# Patient Record
Sex: Male | Born: 1960 | Race: Black or African American | Hispanic: No | Smoking: Current every day smoker
Health system: Southern US, Community
[De-identification: ages and names within clinical notes are randomized; demographics above are authoritative.]

## PROBLEM LIST (undated history)

## (undated) ENCOUNTER — Emergency Department (HOSPITAL_COMMUNITY): Admission: EM | Payer: Self-pay | Source: Home / Self Care

## (undated) DIAGNOSIS — G4730 Sleep apnea, unspecified: Secondary | ICD-10-CM

## (undated) DIAGNOSIS — J189 Pneumonia, unspecified organism: Secondary | ICD-10-CM

## (undated) DIAGNOSIS — M1990 Unspecified osteoarthritis, unspecified site: Secondary | ICD-10-CM

## (undated) DIAGNOSIS — I10 Essential (primary) hypertension: Secondary | ICD-10-CM

## (undated) DIAGNOSIS — F329 Major depressive disorder, single episode, unspecified: Secondary | ICD-10-CM

## (undated) DIAGNOSIS — K219 Gastro-esophageal reflux disease without esophagitis: Secondary | ICD-10-CM

## (undated) DIAGNOSIS — F419 Anxiety disorder, unspecified: Secondary | ICD-10-CM

## (undated) DIAGNOSIS — F4310 Post-traumatic stress disorder, unspecified: Secondary | ICD-10-CM

## (undated) DIAGNOSIS — N400 Enlarged prostate without lower urinary tract symptoms: Secondary | ICD-10-CM

## (undated) HISTORY — PX: KNEE SURGERY: SHX244

---

## 1998-05-19 ENCOUNTER — Emergency Department (HOSPITAL_COMMUNITY): Admission: EM | Admit: 1998-05-19 | Discharge: 1998-05-19 | Payer: Self-pay | Admitting: Emergency Medicine

## 1998-05-19 ENCOUNTER — Encounter: Payer: Self-pay | Admitting: Emergency Medicine

## 1999-01-16 ENCOUNTER — Emergency Department (HOSPITAL_COMMUNITY): Admission: EM | Admit: 1999-01-16 | Discharge: 1999-01-16 | Payer: Self-pay | Admitting: Emergency Medicine

## 1999-08-15 ENCOUNTER — Emergency Department (HOSPITAL_COMMUNITY): Admission: EM | Admit: 1999-08-15 | Discharge: 1999-08-15 | Payer: Self-pay | Admitting: Emergency Medicine

## 1999-08-15 ENCOUNTER — Encounter: Payer: Self-pay | Admitting: Emergency Medicine

## 2002-10-18 ENCOUNTER — Encounter: Admission: RE | Admit: 2002-10-18 | Discharge: 2002-10-18 | Payer: Self-pay | Admitting: Family Medicine

## 2002-10-18 ENCOUNTER — Encounter: Payer: Self-pay | Admitting: Family Medicine

## 2003-06-09 ENCOUNTER — Emergency Department (HOSPITAL_COMMUNITY): Admission: AD | Admit: 2003-06-09 | Discharge: 2003-06-10 | Payer: Self-pay | Admitting: Emergency Medicine

## 2011-11-25 ENCOUNTER — Emergency Department (HOSPITAL_COMMUNITY)
Admission: EM | Admit: 2011-11-25 | Discharge: 2011-11-25 | Disposition: A | Discharge status: Home / Self Care | Payer: Self-pay | Attending: Emergency Medicine | Admitting: Emergency Medicine

## 2011-11-25 ENCOUNTER — Emergency Department (HOSPITAL_COMMUNITY): Payer: Self-pay

## 2011-11-25 ENCOUNTER — Encounter (HOSPITAL_COMMUNITY): Payer: Self-pay | Admitting: *Deleted

## 2011-11-25 ENCOUNTER — Emergency Department (INDEPENDENT_AMBULATORY_CARE_PROVIDER_SITE_OTHER)
Admission: EM | Admit: 2011-11-25 | Discharge: 2011-11-25 | Disposition: A | Discharge status: Nursing Home (Includes ICF) | Payer: Self-pay | Source: Home / Self Care | Attending: Emergency Medicine | Admitting: Emergency Medicine

## 2011-11-25 DIAGNOSIS — I10 Essential (primary) hypertension: Secondary | ICD-10-CM

## 2011-11-25 DIAGNOSIS — R51 Headache: Secondary | ICD-10-CM | POA: Insufficient documentation

## 2011-11-25 DIAGNOSIS — F17200 Nicotine dependence, unspecified, uncomplicated: Secondary | ICD-10-CM | POA: Insufficient documentation

## 2011-11-25 DIAGNOSIS — I169 Hypertensive crisis, unspecified: Secondary | ICD-10-CM

## 2011-11-25 DIAGNOSIS — R079 Chest pain, unspecified: Secondary | ICD-10-CM | POA: Insufficient documentation

## 2011-11-25 DIAGNOSIS — R42 Dizziness and giddiness: Secondary | ICD-10-CM | POA: Insufficient documentation

## 2011-11-25 DIAGNOSIS — H65499 Other chronic nonsuppurative otitis media, unspecified ear: Secondary | ICD-10-CM | POA: Insufficient documentation

## 2011-11-25 HISTORY — DX: Essential (primary) hypertension: I10

## 2011-11-25 LAB — CBC
Hemoglobin: 13.2 g/dL (ref 13.0–17.0)
MCH: 32.8 pg (ref 26.0–34.0)
Platelets: 127 10*3/uL — ABNORMAL LOW (ref 150–400)
RBC: 4.02 MIL/uL — ABNORMAL LOW (ref 4.22–5.81)
WBC: 6.4 10*3/uL (ref 4.0–10.5)

## 2011-11-25 LAB — BASIC METABOLIC PANEL
BUN: 9 mg/dL (ref 6–23)
Chloride: 102 mEq/L (ref 96–112)
GFR calc non Af Amer: >90 mL/min (ref >90)
Glucose, Bld: 104 mg/dL — ABNORMAL HIGH (ref 70–99)
Potassium: 3.1 mEq/L — ABNORMAL LOW (ref 3.5–5.1)
Sodium: 140 mEq/L (ref 135–145)

## 2011-11-25 LAB — DIFFERENTIAL
Eosinophils Absolute: 0.1 10*3/uL (ref 0.0–0.7)
Lymphocytes Relative: 40 % (ref 12–46)
Lymphs Abs: 2.5 10*3/uL (ref 0.7–4.0)
Monocytes Relative: 7 % (ref 3–12)
Neutro Abs: 3.3 10*3/uL (ref 1.7–7.7)
Neutrophils Relative %: 51 % (ref 43–77)

## 2011-11-25 MED ORDER — TRIAMTERENE-HCTZ 37.5-25 MG PO TABS: 1.0000 | ORAL_TABLET | Freq: Every day | ORAL | Status: DC

## 2011-11-25 MED ORDER — IOHEXOL 350 MG/ML SOLN
50.0000 mL | Freq: Once | INTRAVENOUS | Status: AC | PRN
  Administered 2011-11-25: 50 mL via INTRAVENOUS

## 2011-11-25 MED ORDER — DIPHENHYDRAMINE HCL 50 MG/ML IJ SOLN
25.0000 mg | Freq: Once | INTRAMUSCULAR | Status: AC
  Administered 2011-11-25: 25 mg via INTRAVENOUS
  Filled 2011-11-25: qty 1

## 2011-11-25 MED ORDER — METOCLOPRAMIDE HCL 5 MG/ML IJ SOLN
10.0000 mg | Freq: Once | INTRAMUSCULAR | Status: AC
  Administered 2011-11-25: 10 mg via INTRAVENOUS
  Filled 2011-11-25: qty 2

## 2011-11-25 MED ORDER — SODIUM CHLORIDE 0.9 % IV BOLUS (SEPSIS)
1000.0000 mL | Freq: Once | INTRAVENOUS | Status: AC
  Administered 2011-11-25 (×2): 1000 mL via INTRAVENOUS

## 2011-11-25 MED ORDER — POTASSIUM CHLORIDE CRYS ER 20 MEQ PO TBCR
EXTENDED_RELEASE_TABLET | ORAL | Status: AC
  Administered 2011-11-25: 40 meq
  Filled 2011-11-25: qty 2

## 2011-11-25 MED ORDER — LABETALOL HCL 5 MG/ML IV SOLN: 10.0000 mg | Freq: Once | INTRAVENOUS | Status: DC

## 2011-11-25 MED ORDER — POTASSIUM CHLORIDE 20 MEQ/15ML (10%) PO LIQD: 40.0000 meq | Freq: Once | ORAL | Status: DC

## 2011-11-25 NOTE — ED Provider Notes (Addendum)
History     CSN: [id number]  Arrival date & time 11/25/11  1548   First MD Initiated Contact with Patient 11/25/11 1622      Chief Complaint  Patient presents with  . Headache    (Consider location/radiation/quality/duration/timing/severity/associated sxs/prior treatment) HPI    PW multple complaints. Gradual onset yesterday morning.  "All over at the top and pushing in from this sides"Took two advils yesterday with no relief. Headache currently 4-5/10. Denies visual changes. +Photophobia, +phonophobia. Denies nausea, vomiting. Denies numbness/tingling/weakness. +Lightheadedness, none currently after lying down. Denies neck stiffness. No head trauma. Mother has h/o aneurysm.  Patient also c/o chest pain, last night in center of chest. Burning in nature. Feels like his typical acid reflux. Took prilosec with relief.    C/O b/l lower back pain x one month, achy in nature.  Denies history of recent trauma/falls. Denies h/o malignancy, DM, immunocompromise  injection drug use, immunosuppression, indwelling urinary catheter, prolonged steroid use, skin or urinary tract infection. No numbness/tingling/weakness of extremities. Denies fever/chills. Denies saddle anesthesia, no urinary incontinence or retention.  States that the back pain is better today.  H/o etoh abuse    Past Medical History  Diagnosis Date  . Hypertension     Past Surgical History  Procedure Date  . Knee surgery     Family History  Problem Relation Age of Onset  . Diabetes Maternal Aunt   . Diabetes Paternal Aunt     History  Substance Use Topics  . Smoking status: Current Everyday Smoker -- 25 years    Types: Cigarettes  . Smokeless tobacco: Not on file  . Alcohol Use: 1.8 oz/week    3 Shots of liquor per week      Review of Systems  All other systems reviewed and are negative.   except as noted HPI   Allergies  Review of patient's allergies indicates no known allergies.  Home Medications     Current Outpatient Rx  Name Route Sig Dispense Refill  . PRILOSEC PO Oral Take 1 tablet by mouth daily as needed. For acid reflux    . TRIAMTERENE-HCTZ 37.5-25 MG PO TABS Oral Take 1 each (1 tablet total) by mouth daily. 30 tablet 0    BP 180/94  Pulse 61  Temp(Src) 98.1 F (36.7 C) (Oral)  Resp 20  SpO2 100%  Physical Exam  Nursing note and vitals reviewed. Constitutional: He is oriented to person, place, and time. He appears well-developed and well-nourished. No distress.  HENT:  Head: Atraumatic.  Mouth/Throat: Oropharynx is clear and moist.  Eyes: Conjunctivae are normal. Pupils are equal, round, and reactive to light.  Neck: Neck supple.  Cardiovascular: Normal rate, regular rhythm, normal heart sounds and intact distal pulses.  Exam reveals no gallop and no friction rub.   No murmur heard. Pulmonary/Chest: Effort normal. No respiratory distress. He has no wheezes. He has no rales.  Abdominal: Soft. Bowel sounds are normal. There is no tenderness. There is no rebound and no guarding.  Musculoskeletal: Normal range of motion. He exhibits no edema and no tenderness.       No midline c/t/l/s ttp   Neurological: He is alert and oriented to person, place, and time. No cranial nerve deficit. He exhibits normal muscle tone. Coordination normal.       Strength 5/5 all extremities No pronator drift No facial droop  No saddle anesthesia  Skin: Skin is warm and dry.  Psychiatric: He has a normal mood and affect.  Date: 11/25/2011  Rate: 61  Rhythm: normal sinus rhythm  QRS Axis: normal  Intervals: normal  ST/T Wave abnormalities: nonspecific ST changes  Conduction Disutrbances:none  Narrative Interpretation: LVH  Old EKG Reviewed: unchanged   ED Course  Procedures (including critical care time)  Labs Reviewed  CBC - Abnormal; Notable for the following:    RBC 4.02 (*)    HCT 38.7 (*)    Platelets 127 (*)    All other components within normal limits  BASIC  METABOLIC PANEL - Abnormal; Notable for the following:    Potassium 3.1 (*)    Glucose, Bld 104 (*)    All other components within normal limits  DIFFERENTIAL  TROPONIN I   Ct Angio Head W/cm &/or Wo Cm  11/25/2011  *RADIOLOGY REPORT*  Clinical Data:  Headache.  Dizziness.  The patient's mother had an aneurysm.  CT ANGIOGRAPHY HEAD  Technique:  Multidetector CT imaging of the head was performed using the standard protocol during bolus administration of intravenous contrast.  Multiplanar CT image reconstructions including MIPs were obtained to evaluate the vascular anatomy.  Contrast: 50mL OMNIPAQUE IOHEXOL 350 MG/ML SOLN  Comparison:   None.  Findings:  No acute infarct, hemorrhage, mass lesion is present. The ventricles are of normal size.  No significant extra-axial fluid collection is present.  The postcontrast images demonstrate no areas of pathologic enhancement.  The CT angio source images demonstrate normal gray white differentiation.  The internal carotid arteries are within normal limits to the ICA termini.  The A1 and M1 segments are within normal limits.  The ACA and MCA branch vessels are within normal limits.  Anterior communicating artery is patent.  The left vertebral artery is the dominant vessel.  The left PICA origin is visualized and normal.  The right vertebral artery is hypoplastic and terminates at the PICA.  The basilar artery is within normal limits.  Both posterior cerebral arteries originate from the basilar tip.  The PCA branch vessels are within normal limits.   Review of the MIP images confirms the above findings.  IMPRESSION: Negative CTA of the head.  Original Report Authenticated By: Jamesetta Orleans. MATTERN, M.D.    1. Hypertension   2. Headache     MDM  PW headache. Migrainous in nature. Neuro intact. Mother with h/o aneurysm. CTA negative. I doubt SAH clinically. Feeling better with IVF, reglan, benadryl. Headache 2/10 at this time. Blood pressure remains elevated as  below (from documented 208/131 [facility]). Patient declining IV antihypertensives. There may be some component of etoh w/d here. He is not tremulous in the ED. He wants to go home. His chest pain was burning in nature and his typical chronic GERD sx, relief with his antacid. Has been started on Maxzide for hypertension, given referral for PMD f/u. No EMC precluding discharge at this time. Given Precautions for return.   11/25/11 1700 -- 89 19 174/98 100 -- BGJ  BP 180/94  Pulse 61  Temp(Src) 98.1 F (36.7 C) (Oral)  Resp 20  SpO2 100%    Forbes Cellar, MD 11/25/11 2029  Forbes Cellar, MD 11/25/11 2031

## 2011-11-25 NOTE — ED Notes (Signed)
[facility] called for transport 

## 2011-11-25 NOTE — Discharge Instructions (Signed)
General Headache, Without Cause A general headache has no specific cause. These headaches are not life-threatening. They will not lead to other types of headaches. HOME CARE   Make and keep follow-up visits with your doctor.   Only take medicine as told by your doctor.   Try to relax, get a massage, or use your thoughts to control your body (biofeedback).   Apply cold or heat to the head and neck. Apply 3 or 4 times a day or as needed.  Finding out the results of your test Ask when your test results will be ready. Make sure you get your test results. GET HELP RIGHT AWAY IF:   You have problems with medicine.   Your medicine does not help relieve pain.   Your headache changes or becomes worse.   You feel sick to your stomach (nauseous) or throw up (vomit).   You have a temperature by mouth above 102 F (38.9 C), not controlled by medicine.   Your have a stiff neck.   You have vision loss.   You have muscle weakness.   You lose control of your muscles.   You lose balance or have trouble walking.   You feel like you are going to pass out (faint).  MAKE SURE YOU:   Understand these instructions.   Will watch this condition.   Will get help right away if you are not doing well or get worse.  Document Released: 04/08/2008 Document Revised: 06/19/2011 Document Reviewed: 04/08/2008 [facility] Patient Information 2012 [place], [place].  Hypertension As your heart beats, it forces blood through your arteries. This force is your blood pressure. If the pressure is too high, it is called hypertension (HTN) or high blood pressure. HTN is dangerous because you may have it and not know it. High blood pressure may mean that your heart has to work harder to pump blood. Your arteries may be narrow or stiff. The extra work puts you at risk for heart disease, stroke, and other problems.  Blood pressure consists of two numbers, a higher number over a lower, 110/72, for example. It is stated  as "110 over 72." The ideal is below 120 for the top number (systolic) and under 80 for the bottom (diastolic). Write down your blood pressure today. You should pay close attention to your blood pressure if you have certain conditions such as:  Heart failure.   Prior heart attack.   Diabetes   Chronic kidney disease.   Prior stroke.   Multiple risk factors for heart disease.  To see if you have HTN, your blood pressure should be measured while you are seated with your arm held at the level of the heart. It should be measured at least twice. A one-time elevated blood pressure reading (especially in the Emergency Department) does not mean that you need treatment. There may be conditions in which the blood pressure is different between your right and left arms. It is important to see your caregiver soon for a recheck. Most people have essential hypertension which means that there is not a specific cause. This type of high blood pressure may be lowered by changing lifestyle factors such as:  Stress.   Smoking.   Lack of exercise.   Excessive weight.   Drug/tobacco/alcohol use.   Eating less salt.  Most people do not have symptoms from high blood pressure until it has caused damage to the body. Effective treatment can often prevent, delay or reduce that damage. TREATMENT  When a cause  has been identified, treatment for high blood pressure is directed at the cause. There are a large number of medications to treat HTN. These fall into several categories, and your caregiver will help you select the medicines that are best for you. Medications may have side effects. You should review side effects with your caregiver. If your blood pressure stays high after you have made lifestyle changes or started on medicines,   Your medication(s) may need to be changed.   Other problems may need to be addressed.   Be certain you understand your prescriptions, and know how and when to take your  medicine.   Be sure to follow up with your caregiver within the time frame advised (usually within two weeks) to have your blood pressure rechecked and to review your medications.   If you are taking more than one medicine to lower your blood pressure, make sure you know how and at what times they should be taken. Taking two medicines at the same time can result in blood pressure that is too low.  SEEK IMMEDIATE MEDICAL CARE IF:  You develop a severe headache, blurred or changing vision, or confusion.   You have unusual weakness or numbness, or a faint feeling.   You have severe chest or abdominal pain, vomiting, or breathing problems.  MAKE SURE YOU:   Understand these instructions.   Will watch your condition.   Will get help right away if you are not doing well or get worse.  Document Released: 06/30/2005 Document Revised: 06/19/2011 Document Reviewed: 02/18/2008 [facility] Patient Information 2012 [place], [place].  RESOURCE GUIDE  Dental Problems  Patients with Medicaid: [facility] [phone]                                           [phone] [address] [employer] Phone:  [phone]                                                   Phone:  [phone]  If unable to pay or uninsured, contact:  [facility] or [facility]. to become qualified for the adult dental clinic.  Chronic Pain Problems Contact [facility]  [phone] Patients need to be referred by their primary care doctor.  Insufficient Money for Medicine Contact [employer] Way:  call "211" or [facility] [phone].  No Primary Care Doctor Call [facility]  [phone] Other agencies that provide inexpensive medical care    [facility]  [phone]    [facility]  [phone]    [facility]  [phone]    [facility]  [phone]    [facility]  [phone]    [facility]   [phone]  Psychological Services [facility]  [phone] [facility]  [phone] [facility]   [phone] (emergency services [phone])  Abuse/Neglect [facility] Child Abuse Hotline [phone] [facility] Child Abuse Hotline [phone] (After Hours)  Emergency Shelter [facility] 9078540414  Maternity Homes [facility] [place] [facility] [phone]  [employer] Services [phone]  MRSA Hotline #:   902-849-2611    [facility] Resources  [facility]  [employer]                           [facility] Dept. [address] [place]                     [address]         [address]  Blondell Reveal Phone:  [phone]                                  Phone:  [phone]                   Phone:  [phone]  [facility] Phone:  [phone]  [facility] Child Abuse Hotline [phone] (After Hours)

## 2011-11-25 NOTE — ED Notes (Signed)
Patient presents from Urgent Care with c/o headache for 2 days and intermittent CP (he is pain free now)

## 2011-11-25 NOTE — ED Provider Notes (Addendum)
History     CSN: [id number]  Arrival date & time 11/25/11  1401   First MD Initiated Contact with Patient 11/25/11 1448      Chief Complaint  Patient presents with  . Headache    (Consider location/radiation/quality/duration/timing/severity/associated sxs/prior treatment) HPI Comments: Patient is brought in by family members he is been complaining of dizziness and severe headache since yesterday feeling at times that he has felt like he was going pass out. Patient denies any upper or lower extremity weakness but has been having bilateral upper back pain for about a month. Patient describes that he was last alcohol was last night around 7:30 PM.  Patient describes yesterday the light was bothering him as he was having the severe headache this morning as he was preparing to go to work this headache was worse and "pounding". Denies any other symptoms as visual disturbance or incontinence of urine or bowels. Patient describes have been having this intermittent chest discomfort (points to the substernal area) " ITS NOT ALL THE TIME"  Patient is a 51 y.o. male presenting with headaches. The history is provided by the patient.  Headache The primary symptoms include headaches, dizziness and nausea. Primary symptoms do not include syncope, loss of consciousness, altered mental status, seizures, visual change, paresthesias, focal weakness, loss of sensation, speech change, memory loss, fever or vomiting. The symptoms began yesterday.  The headache is associated with photophobia. The headache is not associated with visual change, neck stiffness, paresthesias, weakness or loss of balance.  Dizziness also occurs with nausea. Dizziness does not occur with tinnitus, vomiting, weakness or diaphoresis.  Additional symptoms include pain and photophobia. Additional symptoms do not include neck stiffness, weakness, loss of balance, nystagmus, hearing loss, tinnitus or vertigo. Medical issues also include alcohol  use and hypertension. Medical issues do not include seizures.    Past Medical History  Diagnosis Date  . Hypertension     Past Surgical History  Procedure Date  . Knee surgery     Family History  Problem Relation Age of Onset  . Diabetes Maternal Aunt   . Diabetes Paternal Aunt     History  Substance Use Topics  . Smoking status: Current Everyday Smoker -- 25 years    Types: Cigarettes  . Smokeless tobacco: Not on file  . Alcohol Use: 1.8 oz/week    3 Shots of liquor per week      Review of Systems  Constitutional: Negative for fever, chills, diaphoresis, activity change and appetite change.  HENT: Negative for hearing loss, neck stiffness and tinnitus.   Eyes: Positive for photophobia. Negative for visual disturbance.  Cardiovascular: Positive for chest pain. Negative for palpitations and syncope.  Gastrointestinal: Positive for nausea. Negative for vomiting, abdominal pain and rectal pain.  Genitourinary: Negative for dysuria.  Neurological: Positive for dizziness, tremors, light-headedness and headaches. Negative for vertigo, speech change, focal weakness, seizures, loss of consciousness, speech difficulty, weakness, numbness, paresthesias and loss of balance.  Psychiatric/Behavioral: Negative for memory loss and altered mental status.    Allergies  Review of patient's allergies indicates no known allergies.  Home Medications  No current outpatient prescriptions on file.  BP 193/108  Pulse 64  Temp(Src) 98.4 F (36.9 C) (Oral)  Resp 20  SpO2 98%  Physical Exam  Nursing note and vitals reviewed. Constitutional: He is oriented to person, place, and time.  Non-toxic appearance. He has a sickly appearance. He does not appear ill. He appears distressed.  HENT:  Head: Normocephalic.  Eyes:  Conjunctivae are normal. Pupils are equal, round, and reactive to light.  Neck: Neck supple. No JVD present.  Cardiovascular: Normal rate, regular rhythm, normal heart  sounds and normal pulses.   Pulmonary/Chest: Effort normal and breath sounds normal. He has no decreased breath sounds. He has no wheezes. He has no rhonchi. He has no rales.  Musculoskeletal: Normal range of motion.  Lymphadenopathy:    He has no cervical adenopathy.  Neurological: He is alert and oriented to person, place, and time. He is not disoriented. He displays tremor. No cranial nerve deficit or sensory deficit. He exhibits normal muscle tone. Coordination normal.    ED Course  Procedures (including critical care time)  Labs Reviewed - No data to display No results found.   1. Hypertensive crisis       MDM    hypertensive crisis with intermittent substernal discomforts. And possibly early signs of ETOH withdrawal. He will need significant monitoring-observation blood pressure control. At this point patient does not have any electrocardiographic symptoms of acute coronary event. Given his risk factors and intermittent nature of the substernal chest pain will consider further workup including cardiac. EKG showed a sinus bradycardia and left ventricular hypertrophy no ST depressions or elevations were noted. No Q waves. Segments and intervals within normal values.     Jimmie Molly, MD 11/25/11 1506  Jimmie Molly, MD 11/25/11 1511  Jimmie Molly, MD 11/25/11 1536

## 2011-11-25 NOTE — ED Notes (Signed)
Cardiac monitor applied---NSR without ectopics seen  And o2 2L nasal cannula applied

## 2011-11-25 NOTE — ED Notes (Signed)
Patient presents with c/o headache for the past 2 days, some dizziness and epigastric pain.

## 2011-11-25 NOTE — ED Notes (Signed)
[facility] here for transport to main ED

## 2011-11-25 NOTE — ED Notes (Signed)
Pt reports dizziness and a headache yesterday.   He admits to several alcoholic  Drinks last night.  Today c/o headache and some dizziness.  He also c/o bilateral flank pain X 1 month.  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

## 2011-11-25 NOTE — Discharge Instructions (Signed)
Arterial Hypertension Arterial hypertension (high blood pressure) is a condition of elevated pressure in your blood vessels. Hypertension over a long period of time is a risk factor for strokes, heart attacks, and heart failure. It is also the leading cause of kidney (renal) failure.  CAUSES   In Adults -- Over 90% of all hypertension has no known cause. This is called essential or primary hypertension. In the other 10% of people with hypertension, the increase in blood pressure is caused by another disorder. This is called secondary hypertension. Important causes of secondary hypertension are:   Heavy alcohol use.   Obstructive sleep apnea.   Hyperaldosterosim (Conn's syndrome).   Steroid use.   Chronic kidney failure.   Hyperparathyroidism.   Medications.   Renal artery stenosis.   Pheochromocytoma.   Cushing's disease.   Coarctation of the aorta.   Scleroderma renal crisis.   Licorice (in excessive amounts).   Drugs (cocaine, methamphetamine).  Your caregiver can explain any items above that apply to you.  In Children -- Secondary hypertension is more common and should always be considered.   Pregnancy -- Few women of childbearing age have high blood pressure. However, up to 10% of them develop hypertension of pregnancy. Generally, this will not harm the woman. It may be a sign of 3 complications of pregnancy: preeclampsia, HELLP syndrome, and eclampsia. Follow up and control with medication is necessary.  SYMPTOMS   This condition normally does not produce any noticeable symptoms. It is usually found during a routine exam.   Malignant hypertension is a late problem of high blood pressure. It may have the following symptoms:   Headaches.   Blurred vision.   End-organ damage (this means your kidneys, heart, lungs, and other organs are being damaged).   Stressful situations can increase the blood pressure. If a person with normal blood pressure has their blood  pressure go up while being seen by their caregiver, this is often termed "white coat hypertension." Its importance is not known. It may be related with eventually developing hypertension or complications of hypertension.   Hypertension is often confused with mental tension, stress, and anxiety.  DIAGNOSIS  The diagnosis is made by 3 separate blood pressure measurements. They are taken at least 1 week apart from each other. If there is organ damage from hypertension, the diagnosis may be made without repeat measurements. Hypertension is usually identified by having blood pressure readings:  Above 140/90 mmHg measured in both arms, at 3 separate times, over a couple weeks.   Over 130/80 mmHg should be considered a risk factor and may require treatment in patients with diabetes.  Blood pressure readings over 120/80 mmHg are called "pre-hypertension" even in non-diabetic patients. To get a true blood pressure measurement, use the following guidelines. Be aware of the factors that can alter blood pressure readings.  Take measurements at least 1 hour after caffeine.   Take measurements 30 minutes after smoking and without any stress. This is another reason to quit smoking - it raises your blood pressure.   Use a proper cuff size. Ask your caregiver if you are not sure about your cuff size.   Most home blood pressure cuffs are automatic. They will measure systolic and diastolic pressures. The systolic pressure is the pressure reading at the start of sounds. Diastolic pressure is the pressure at which the sounds disappear. If you are elderly, measure pressures in multiple postures. Try sitting, lying or standing.   Sit at rest for a minimum of   5 minutes before taking measurements.   You should not be on any medications like decongestants. These are found in many cold medications.   Record your blood pressure readings and review them with your caregiver.  If you have hypertension:  Your caregiver  may do tests to be sure you do not have secondary hypertension (see "causes" above).   Your caregiver may also look for signs of metabolic syndrome. This is also called Syndrome X or Insulin Resistance Syndrome. You may have this syndrome if you have type 2 diabetes, abdominal obesity, and abnormal blood lipids in addition to hypertension.   Your caregiver will take your medical and family history and perform a physical exam.   Diagnostic tests may include blood tests (for glucose, cholesterol, potassium, and kidney function), a urinalysis, or an EKG. Other tests may also be necessary depending on your condition.  PREVENTION  There are important lifestyle issues that you can adopt to reduce your chance of developing hypertension:  Maintain a normal weight.   Limit the amount of salt (sodium) in your diet.   Exercise often.   Limit alcohol intake.   Get enough potassium in your diet. Discuss specific advice with your caregiver.   Follow a DASH diet (dietary approaches to stop hypertension). This diet is rich in fruits, vegetables, and low-fat dairy products, and avoids certain fats.  PROGNOSIS  Essential hypertension cannot be cured. Lifestyle changes and medical treatment can lower blood pressure and reduce complications. The prognosis of secondary hypertension depends on the underlying cause. Many people whose hypertension is controlled with medicine or lifestyle changes can live a normal, healthy life.  RISKS AND COMPLICATIONS  While high blood pressure alone is not an illness, it often requires treatment due to its short- and long-term effects on many organs. Hypertension increases your risk for:  CVAs or strokes (cerebrovascular accident).   Heart failure due to chronically high blood pressure (hypertensive cardiomyopathy).   Heart attack (myocardial infarction).   Damage to the retina (hypertensive retinopathy).   Kidney failure (hypertensive nephropathy).  Your caregiver can  explain list items above that apply to you. Treatment of hypertension can significantly reduce the risk of complications. TREATMENT   For overweight patients, weight loss and regular exercise are recommended. Physical fitness lowers blood pressure.   Mild hypertension is usually treated with diet and exercise. A diet rich in fruits and vegetables, fat-free dairy products, and foods low in fat and salt (sodium) can help lower blood pressure. Decreasing salt intake decreases blood pressure in a 1/3 of people.   Stop smoking if you are a smoker.  The steps above are highly effective in reducing blood pressure. While these actions are easy to suggest, they are difficult to achieve. Most patients with moderate or severe hypertension end up requiring medications to bring their blood pressure down to a normal level. There are several classes of medications for treatment. Blood pressure pills (antihypertensives) will lower blood pressure by their different actions. Lowering the blood pressure by 10 mmHg may decrease the risk of complications by as much as 25%. The goal of treatment is effective blood pressure control. This will reduce your risk for complications. Your caregiver will help you determine the best treatment for you according to your lifestyle. What is excellent treatment for one person, may not be for you. HOME CARE INSTRUCTIONS   Do not smoke.   Follow the lifestyle changes outlined in the "Prevention" section.   If you are on medications, follow the directions   carefully. Blood pressure medications must be taken as prescribed. Skipping doses reduces their benefit. It also puts you at risk for problems.   Follow up with your caregiver, as directed.   If you are asked to monitor your blood pressure at home, follow the guidelines in the "Diagnosis" section above.  SEEK MEDICAL CARE IF:   You think you are having medication side effects.   You have recurrent headaches or lightheadedness.     You have swelling in your ankles.   You have trouble with your vision.  SEEK IMMEDIATE MEDICAL CARE IF:   You have sudden onset of chest pain or pressure, difficulty breathing, or other symptoms of a heart attack.   You have a severe headache.   You have symptoms of a stroke (such as sudden weakness, difficulty speaking, difficulty walking).  MAKE SURE YOU:   Understand these instructions.   Will watch your condition.   Will get help right away if you are not doing well or get worse.  Document Released: 06/30/2005 Document Revised: 06/19/2011 Document Reviewed: 01/28/2007 [employer] Patient Information 2012 [employer]. 

## 2012-02-09 ENCOUNTER — Emergency Department (HOSPITAL_COMMUNITY): Payer: Self-pay

## 2012-02-09 ENCOUNTER — Encounter (HOSPITAL_COMMUNITY): Payer: Self-pay | Admitting: *Deleted

## 2012-02-09 ENCOUNTER — Inpatient Hospital Stay (HOSPITAL_COMMUNITY)
Admission: EM | Admit: 2012-02-09 | Discharge: 2012-02-12 | DRG: 177 | Disposition: A | Discharge status: Home / Self Care | Payer: MEDICAID | Attending: Internal Medicine | Admitting: Internal Medicine

## 2012-02-09 ENCOUNTER — Inpatient Hospital Stay (HOSPITAL_COMMUNITY): Payer: Self-pay

## 2012-02-09 DIAGNOSIS — I10 Essential (primary) hypertension: Secondary | ICD-10-CM | POA: Diagnosis present

## 2012-02-09 DIAGNOSIS — N170 Acute kidney failure with tubular necrosis: Secondary | ICD-10-CM | POA: Diagnosis present

## 2012-02-09 DIAGNOSIS — F17200 Nicotine dependence, unspecified, uncomplicated: Secondary | ICD-10-CM | POA: Diagnosis present

## 2012-02-09 DIAGNOSIS — Z79899 Other long term (current) drug therapy: Secondary | ICD-10-CM

## 2012-02-09 DIAGNOSIS — J189 Pneumonia, unspecified organism: Secondary | ICD-10-CM | POA: Diagnosis present

## 2012-02-09 DIAGNOSIS — E876 Hypokalemia: Secondary | ICD-10-CM | POA: Diagnosis present

## 2012-02-09 DIAGNOSIS — M6282 Rhabdomyolysis: Secondary | ICD-10-CM | POA: Diagnosis present

## 2012-02-09 DIAGNOSIS — A481 Legionnaires' disease: Secondary | ICD-10-CM | POA: Diagnosis present

## 2012-02-09 DIAGNOSIS — N179 Acute kidney failure, unspecified: Secondary | ICD-10-CM | POA: Diagnosis present

## 2012-02-09 DIAGNOSIS — K219 Gastro-esophageal reflux disease without esophagitis: Secondary | ICD-10-CM | POA: Diagnosis present

## 2012-02-09 DIAGNOSIS — E46 Unspecified protein-calorie malnutrition: Secondary | ICD-10-CM | POA: Diagnosis present

## 2012-02-09 DIAGNOSIS — E871 Hypo-osmolality and hyponatremia: Secondary | ICD-10-CM | POA: Diagnosis present

## 2012-02-09 DIAGNOSIS — F1010 Alcohol abuse, uncomplicated: Secondary | ICD-10-CM | POA: Diagnosis present

## 2012-02-09 DIAGNOSIS — R17 Unspecified jaundice: Secondary | ICD-10-CM | POA: Diagnosis present

## 2012-02-09 DIAGNOSIS — Z720 Tobacco use: Secondary | ICD-10-CM | POA: Diagnosis present

## 2012-02-09 HISTORY — DX: Gastro-esophageal reflux disease without esophagitis: K21.9

## 2012-02-09 HISTORY — DX: Pneumonia, unspecified organism: J18.9

## 2012-02-09 LAB — HEPATIC FUNCTION PANEL
ALT: 28 U/L (ref 0–53)
Alkaline Phosphatase: 48 U/L (ref 39–117)
Bilirubin, Direct: 0.7 mg/dL — ABNORMAL HIGH (ref 0.0–0.3)
Indirect Bilirubin: 1.2 mg/dL — ABNORMAL HIGH (ref 0.3–0.9)
Total Bilirubin: 1.9 mg/dL — ABNORMAL HIGH (ref 0.3–1.2)
Total Protein: 6.2 g/dL (ref 6.0–8.3)

## 2012-02-09 LAB — TSH: TSH: 1.137 u[IU]/mL (ref 0.350–4.500)

## 2012-02-09 LAB — URINALYSIS, ROUTINE W REFLEX MICROSCOPIC
Bilirubin Urine: NEGATIVE
Glucose, UA: NEGATIVE mg/dL
Ketones, ur: NEGATIVE mg/dL
Leukocytes, UA: NEGATIVE
Nitrite: NEGATIVE
Specific Gravity, Urine: 1.015 (ref 1.005–1.030)
pH: 5.5 (ref 5.0–8.0)

## 2012-02-09 LAB — BASIC METABOLIC PANEL
BUN: 29 mg/dL — ABNORMAL HIGH (ref 6–23)
CO2: 24 mEq/L (ref 19–32)
Chloride: 87 mEq/L — ABNORMAL LOW (ref 96–112)
Creatinine, Ser: 1.98 mg/dL — ABNORMAL HIGH (ref 0.50–1.35)
Glucose, Bld: 188 mg/dL — ABNORMAL HIGH (ref 70–99)
Potassium: 2.8 mEq/L — ABNORMAL LOW (ref 3.5–5.1)

## 2012-02-09 LAB — URINE MICROSCOPIC-ADD ON

## 2012-02-09 LAB — CBC WITH DIFFERENTIAL/PLATELET
Eosinophils Relative: 0 % (ref 0–5)
Hemoglobin: 13.4 g/dL (ref 13.0–17.0)
Lymphocytes Relative: 7 % — ABNORMAL LOW (ref 12–46)
Lymphs Abs: 0.9 10*3/uL (ref 0.7–4.0)
MCV: 91.6 fL (ref 78.0–100.0)
Monocytes Relative: 5 % (ref 3–12)
Neutrophils Relative %: 88 % — ABNORMAL HIGH (ref 43–77)
Platelets: 160 10*3/uL (ref 150–400)
RBC: 4.07 MIL/uL — ABNORMAL LOW (ref 4.22–5.81)
WBC: 12.6 10*3/uL — ABNORMAL HIGH (ref 4.0–10.5)

## 2012-02-09 LAB — POCT I-STAT, CHEM 8
BUN: 27 mg/dL — ABNORMAL HIGH (ref 6–23)
Creatinine, Ser: 2.1 mg/dL — ABNORMAL HIGH (ref 0.50–1.35)
Glucose, Bld: 198 mg/dL — ABNORMAL HIGH (ref 70–99)
Potassium: 3 mEq/L — ABNORMAL LOW (ref 3.5–5.1)
Sodium: 127 mEq/L — ABNORMAL LOW (ref 135–145)

## 2012-02-09 LAB — CK: Total CK: 4514 U/L — ABNORMAL HIGH (ref 7–232)

## 2012-02-09 LAB — OSMOLALITY, URINE: Osmolality, Ur: 355 mOsm/kg — ABNORMAL LOW (ref 390–1090)

## 2012-02-09 LAB — HIV ANTIBODY (ROUTINE TESTING W REFLEX): HIV: NONREACTIVE

## 2012-02-09 LAB — LACTATE DEHYDROGENASE: LDH: 475 U/L — ABNORMAL HIGH (ref 94–250)

## 2012-02-09 LAB — HAPTOGLOBIN: Haptoglobin: 502 mg/dL — ABNORMAL HIGH (ref 45–215)

## 2012-02-09 MED ORDER — SODIUM CHLORIDE 0.9 % IV SOLN
INTRAVENOUS | Status: AC
  Administered 2012-02-09 (×4): via INTRAVENOUS

## 2012-02-09 MED ORDER — ACETAMINOPHEN 325 MG PO TABS
650.0000 mg | ORAL_TABLET | Freq: Four times a day (QID) | ORAL | Status: DC | PRN
  Administered 2012-02-09 – 2012-02-10 (×3): 650 mg via ORAL
  Filled 2012-02-09 (×4): qty 2

## 2012-02-09 MED ORDER — ADULT MULTIVITAMIN W/MINERALS CH
1.0000 | ORAL_TABLET | Freq: Every day | ORAL | Status: DC
  Administered 2012-02-09 – 2012-02-12 (×4): 1 via ORAL
  Filled 2012-02-09 (×4): qty 1

## 2012-02-09 MED ORDER — ENOXAPARIN SODIUM 30 MG/0.3ML ~~LOC~~ SOLN
30.0000 mg | SUBCUTANEOUS | Status: DC
  Administered 2012-02-09: 30 mg via SUBCUTANEOUS
  Filled 2012-02-09: qty 0.3

## 2012-02-09 MED ORDER — DEXTROSE 5 % IV SOLN
1.0000 g | INTRAVENOUS | Status: DC
  Administered 2012-02-10: 1 g via INTRAVENOUS
  Filled 2012-02-09 (×2): qty 10

## 2012-02-09 MED ORDER — FOLIC ACID 1 MG PO TABS
1.0000 mg | ORAL_TABLET | Freq: Every day | ORAL | Status: DC
  Administered 2012-02-09 – 2012-02-12 (×4): 1 mg via ORAL
  Filled 2012-02-09 (×4): qty 1

## 2012-02-09 MED ORDER — DEXTROSE 5 % IV SOLN
500.0000 mg | INTRAVENOUS | Status: DC
  Administered 2012-02-10 – 2012-02-11 (×2): 500 mg via INTRAVENOUS
  Filled 2012-02-09 (×2): qty 500

## 2012-02-09 MED ORDER — PANTOPRAZOLE SODIUM 40 MG PO TBEC
40.0000 mg | DELAYED_RELEASE_TABLET | Freq: Every day | ORAL | Status: DC
  Administered 2012-02-09 – 2012-02-12 (×4): 40 mg via ORAL
  Filled 2012-02-09 (×4): qty 1

## 2012-02-09 MED ORDER — LORAZEPAM 1 MG PO TABS
1.0000 mg | ORAL_TABLET | Freq: Four times a day (QID) | ORAL | Status: AC | PRN
  Administered 2012-02-10 – 2012-02-11 (×3): 1 mg via ORAL
  Filled 2012-02-09 (×3): qty 1

## 2012-02-09 MED ORDER — SODIUM CHLORIDE 0.9 % IV SOLN
INTRAVENOUS | Status: DC
  Administered 2012-02-10 (×3): via INTRAVENOUS
  Administered 2012-02-11: 1000 mL via INTRAVENOUS

## 2012-02-09 MED ORDER — ACETAMINOPHEN 325 MG PO TABS
650.0000 mg | ORAL_TABLET | Freq: Once | ORAL | Status: AC
  Administered 2012-02-09: 650 mg via ORAL
  Filled 2012-02-09: qty 3

## 2012-02-09 MED ORDER — SODIUM CHLORIDE 0.9 % IV BOLUS (SEPSIS)
1000.0000 mL | Freq: Once | INTRAVENOUS | Status: AC
  Administered 2012-02-09: 1000 mL via INTRAVENOUS

## 2012-02-09 MED ORDER — IOHEXOL 300 MG/ML  SOLN
60.0000 mL | Freq: Once | INTRAMUSCULAR | Status: AC | PRN
  Administered 2012-02-09: 60 mL via INTRAVENOUS

## 2012-02-09 MED ORDER — PROCHLORPERAZINE MALEATE 5 MG PO TABS
5.0000 mg | ORAL_TABLET | Freq: Four times a day (QID) | ORAL | Status: DC | PRN
  Filled 2012-02-09: qty 1

## 2012-02-09 MED ORDER — THIAMINE HCL 100 MG/ML IJ SOLN
100.0000 mg | Freq: Every day | INTRAMUSCULAR | Status: DC
  Filled 2012-02-09 (×4): qty 1

## 2012-02-09 MED ORDER — VITAMIN B-1 100 MG PO TABS
100.0000 mg | ORAL_TABLET | Freq: Every day | ORAL | Status: DC
  Administered 2012-02-09 – 2012-02-12 (×4): 100 mg via ORAL
  Filled 2012-02-09 (×4): qty 1

## 2012-02-09 MED ORDER — LORAZEPAM 2 MG/ML IJ SOLN: 1.0000 mg | Freq: Four times a day (QID) | INTRAMUSCULAR | Status: AC | PRN

## 2012-02-09 MED ORDER — DEXTROSE 5 % IV SOLN
1.0000 g | Freq: Once | INTRAVENOUS | Status: AC
  Administered 2012-02-09: 1 g via INTRAVENOUS
  Filled 2012-02-09: qty 10

## 2012-02-09 MED ORDER — DEXTROSE 5 % IV SOLN
500.0000 mg | Freq: Once | INTRAVENOUS | Status: AC
  Administered 2012-02-09: 500 mg via INTRAVENOUS
  Filled 2012-02-09: qty 500

## 2012-02-09 MED ORDER — POTASSIUM CHLORIDE CRYS ER 20 MEQ PO TBCR
40.0000 meq | EXTENDED_RELEASE_TABLET | Freq: Two times a day (BID) | ORAL | Status: DC
  Administered 2012-02-09: 40 meq via ORAL
  Filled 2012-02-09: qty 2

## 2012-02-09 MED ORDER — POTASSIUM CHLORIDE 10 MEQ/100ML IV SOLN
10.0000 meq | INTRAVENOUS | Status: AC
  Administered 2012-02-09 (×2): 10 meq via INTRAVENOUS
  Filled 2012-02-09 (×4): qty 100

## 2012-02-09 MED ORDER — MORPHINE SULFATE 2 MG/ML IJ SOLN
2.0000 mg | INTRAMUSCULAR | Status: DC | PRN
  Administered 2012-02-09 (×2): 2 mg via INTRAVENOUS
  Administered 2012-02-10 (×2): 4 mg via INTRAVENOUS
  Filled 2012-02-09 (×2): qty 2
  Filled 2012-02-09 (×2): qty 1

## 2012-02-09 MED ORDER — ENOXAPARIN SODIUM 40 MG/0.4ML ~~LOC~~ SOLN
40.0000 mg | SUBCUTANEOUS | Status: DC
  Administered 2012-02-10 – 2012-02-11 (×2): 40 mg via SUBCUTANEOUS
  Filled 2012-02-09 (×3): qty 0.4

## 2012-02-09 NOTE — ED Notes (Addendum)
reports fell off truck on Wednesday, c/o R upper & L lower CP/rib pain, worse with palpation, inspiration and movement. Also reports sob, dizzy, light headed. LS with crackles at bases. Denies pain w/o movement. Fever noted, HR 124.

## 2012-02-09 NOTE — ED Notes (Signed)
Admitting Resident here to see patient

## 2012-02-09 NOTE — Progress Notes (Signed)
02/09/2012 patient transfer from the emergency room to 6700 at 0945. He is alert,oriented and ambulatory. Patient complain of weakness. Patient skin is fine, but  bilateral feet dry. Patient complain chest hurt whenever he cough. Patient have a nonproductive cough and not coughing up anything. He is non telemetry. Patient have fallen off dump truck last week. He is placed on fall precaution because of weakness. Yellow band and red socks was placed on patient. Patient does have wheezing, but saturation been 95 to 96% on room air. Biochemist, clinical.

## 2012-02-09 NOTE — ED Notes (Signed)
Report has been called, waiting for admitting to put orders in

## 2012-02-09 NOTE — ED Notes (Signed)
Informed MD Otter of poss. PNA results on CXR per Radiology

## 2012-02-09 NOTE — ED Notes (Signed)
Gave pt a cup with a label for a urine sample.7:24am JG.

## 2012-02-09 NOTE — H&P (Signed)
Hospital Admission Note Date: 02/09/2012  Patient name: Justin Nolan Medical record number: [id number] Date of birth: 1960-08-16 Age: 51 y.o. Gender: male PCP: None  Medical Service: Internal Medicine Teaching Service, [facility] Service  Attending physician:  Dr. Doneen Poisson     1st Contact: Dr. Bronson Curb  Pager: [phone] 2nd Contact: Dr. Lyn Hollingshead   Pager: [phone] After 5 pm or weekends: 1st Contact:      Pager: [phone] 2nd Contact:      Pager: [phone]  Chief Complaint: Chest pain, fever, shortness of breath  History of Present Illness: Justin Nolan is a 51 year old man with a PMH significant only for HTN who presents to the [facility] ED with complaints of fever, shortness of breath, cough, and chest pain since Thursday.  He states that he has never had anything like this before.  The chest pain is a stabbing chest pain that gets worse whenever he takes a deep breath or coughs.  He states that he has been afraid to cough vigorously because of the pain.  He states that since Saturday he has had subjective fevers and chills.  He also has had problems with his appetite and that he hasn't eaten anything since Thursday but has been drinking Gatorade to try to stay hydrated.  He denies vomiting, diarrhea, constipation, abdominal pain, hemoptysis, blood in his stools, dysuria, chest pain on exertion, or swelling in his legs.  He states that he stopped taking his medications on Tuesday because he had started to feel lightheaded when he was standing up and walking.    He has a history of binge drinking during the weekends when he isn't working.  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 He states that he does not drink during the week when he is working and has never had a problem with withdrawal from alcohol.    He also states that on Tuesday he was cleaning out his dump truck and slipped and fell coming out of  the box and fell on his right buttock and right elbow.  He states that he was sore but that these now do not hurt.   Meds: Current Outpatient Rx  Name Route Sig Dispense Refill  . ESOMEPRAZOLE MAGNESIUM 40 MG PO CPDR Oral Take 40 mg by mouth daily as needed. For acid reflux    . TRIAMTERENE-HCTZ 37.5-25 MG PO TABS Oral Take 1 each (1 tablet total) by mouth daily. 30 tablet 0   Allergies: Allergies as of 02/09/2012  . (No Known Allergies)   Past Medical History  Diagnosis Date  . Hypertension    Past Surgical History  Procedure Date  . Knee surgery    Family History  Problem Relation Age of Onset  . Diabetes Maternal Aunt   . Diabetes Paternal Aunt    History   Social History  . Marital Status: Widowed    Spouse Name: N/A    Number of Children: N/A  . Years of Education: N/A   Occupational History  . Not on file.   Social History Main Topics  . Smoking status: Current Everyday Smoker -- 0.5 packs/day for 25 years    Types: Cigarettes  . Smokeless tobacco: Not on file  . Alcohol Use: 1.8 oz/week    3 Shots of liquor per week     history of weekend binge drinking  . Drug Use: No  .  Sexually Active: Not on file   Other Topics Concern  . 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     Review of Systems: Constitutional: Positive for  fever, chills, and appetite change.  Denies diaphoresis, and fatigue.  HEENT: Denies photophobia, eye pain, redness, hearing loss, ear pain, congestion, sore throat, rhinorrhea, sneezing, mouth sores, trouble swallowing, neck pain, neck stiffness and tinnitus.   Respiratory: Positive for SOB, cough.  Denies DOE, chest tightness,  and wheezing.   Cardiovascular: Positive for left chest pain.  Denies palpitations and leg swelling.  Gastrointestinal: Positive for nausea.  Denies vomiting, abdominal pain, diarrhea, constipation, blood in stool and abdominal distention.  Genitourinary:  Denies dysuria, urgency, frequency, hematuria, flank pain and difficulty urinating.  Musculoskeletal: Denies myalgias, back pain, joint swelling, arthralgias and gait problem.  Skin: Denies pallor, rash and wound.  Neurological: Denies dizziness, seizures, syncope, weakness, light-headedness, numbness and headaches.  Hematological: Denies adenopathy. Easy bruising, personal or family bleeding history  Psychiatric/Behavioral: Denies suicidal ideation, mood changes, confusion, nervousness, sleep disturbance and agitation  Physical Exam: Blood pressure 129/69, pulse 86, temperature 100.4 F (38 C), temperature source Oral, resp. rate 35, SpO2 94.00%. Constitutional: Vital signs reviewed.  Patient is a well-developed and well-nourished man in mild distress from pain. He is mildly tremulous during the exam but is cooperative with exam. Alert and oriented x3.  Head: Normocephalic and atraumatic Ear: TM normal bilaterally Mouth: no erythema or exudates, dry mucus membranes Eyes: PERRL, EOMI, conjunctivae normal, mild scleral icterus.  Neck: Supple, strong carotid pulses. Trachea midline normal ROM, No JVD, mass, thyromegaly, or carotid bruit present.  Cardiovascular: tachycardic but regular rhythm, S1 normal, S2 normal, no MRG, pulses symmetric and intact bilaterally Pulmonary/Chest: moderate pain with deep inspiration, expiratory wheezes and inspiratory crackles on exam in the left anterior and posterior lung fields.  Right lung fields clear.   Abdominal: Soft. Non-tender, non-distended, bowel sounds are normal, no masses, organomegaly, or guarding present.  GU: no CVA tenderness Musculoskeletal: No joint deformities, erythema, or stiffness, ROM full and no nontender Hematology: no cervical, inginal, or axillary adenopathy.  Neurological: A&O x3, Strength is normal and symmetric bilaterally, cranial nerve II-XII are grossly intact, no focal motor deficit, sensory intact to light touch bilaterally.    Skin: Warm, dry and intact. No rash, cyanosis, or clubbing.  Psychiatric: tremulous with anxious mood and affect. speech and behavior is normal. Judgment and thought content normal. Cognition and memory are normal.   Lab results: Basic Metabolic Panel:  Basename 02/09/12 0609 02/09/12 0441  NA 126* 127*  K 2.8* 3.0*  CL 87* 87*  CO2 24 --  GLUCOSE 188* 198*  BUN 29* 27*  CREATININE 1.98* 2.10*  CALCIUM 7.6* --  MG -- --  PHOS -- --   Liver Function Tests:  [facility] 02/09/12 0609  AST 109*  ALT 28  ALKPHOS 48  BILITOT 1.9*  PROT 6.2  ALBUMIN 2.3*   CBC:  Basename 02/09/12 0441 02/09/12 0431  WBC -- 12.6*  NEUTROABS -- 11.0*  HGB 14.6 13.4  HCT 43.0 37.3*  MCV -- 91.6  PLT -- 160   Imaging results:  Dg Chest 2 View  02/09/2012  *RADIOLOGY REPORT*  Clinical Data: Left chest pain, cough.  CHEST - 2 VIEW  Comparison: None.  Findings: left upper lobe consolidation and mass like consolidation along the fissure.  No dependent pleural effusion. No pneumothorax. Cardiac contour  within normal range. Left hilar fullness.  No acute osseous finding.  IMPRESSION: Left lung consolidation may reflect pneumonia.  Underlying mass not excluded.  Recommend radiographic follow-up after therapy.  Original Report Authenticated By: Waneta Martins, M.D.   Other results: EKG: tachycardia, P waves are hard to visualize but appear to be sinus rhythm, LVH.  Assessment & Plan by Problem: 1. Community acquired pneumonia:  Mr. Delaguila is a 51 year old man with a PMH significant for hypertension who present with fever, chills, cough, shortness of breath, and CXR findings consistent with infiltrate.  He also has a increased ANC all of which make this most likely community acquired pneumonia.  He has some pleuritic chest pain on exam today that correlates with the area of consolidation seen on chest x-ray.  Chest x-ray reading does mention possible mass like consolidation and with his marked chest  pain there is some concern for pleurisy vs possible underlying mass with post obstructive pneumonia.  He denies any weight loss or adenopathy but has a history of alcohol abuse as well as tobacco abuse.    - Admit to med/surg  - CTA of the chest to assess for mass.  - Rocephin and azithromycin  - Urine legionella and strep  2.  Acute Kidney Injury:  Creatinine on admission is 1.98 and BUN is 29.  Previous baseline done back in May of 2013 was 0.98.  Differential diagnosis includes pre-renal causes from volume depletion from decreased oral intake or SIADH, renal causes including ATN secondary to rhabdomyolysis or trauma, or post renal causes such as obstruction.  He has no history of problems urinating and has no flank pain that would indicate post renal obstruction.  He does endorse decreased oral intake over the last several days and he had been having dizziness while taking his Maxzide for several days prior to the start of the cough, fever, and shortness of breath.  His BUN to creatinine ratio is 15 which generally indicates renal cause of the AKI.    - Urine electrolytes to calculate FENa  - Hydrate with NS at 200 ml/hr for 15 hours then 125 per hour  - CK to assess for rhabdomyolysis  - UA with microscopy  - Urine Osmolarity  3.  Indirect hyperbilirubinemia:  On admission the patient was noted to have an increased total bilirubin.  Fractionated bilirubin showed increased indirect bilirubin as well as increased direct bilirubin.  ALT was normal but AST was mildly elevated.  Differential diagnosis includes causes of increased bilirubin production such as hemolysis, medications, or decreased liver conjugation such as Gilbert's syndrome or chronic liver disease possibly secondary to alcohol abuse.  - Check hemolysis labs, LDH, Haptoglobin, and peripheral smear  - check GGT for possible alcoholic liver disease  - Consider RUQ ultrasound and hepatitis Testing  4. Hypertension:  He is currently  normotensive and states that he had been feeling dizzy while taking his medications.  He is also currently tachycardic, hyponatremic, hypochloremic, and hypokalemic which likely indicates volume depletion.  We will hold his BP medications for now while we hydrate him.    5. Tobacco and alcohol abuse:  He would likely benefit from counseling concerning his tobacco and binge alcohol use.  We will discuss this with him and see if he wants contact with support to help him quit.  6.  VTE: Lovenox  Signed: Anderson Middlebrooks 02/09/2012, 9:13 AM

## 2012-02-09 NOTE — ED Provider Notes (Addendum)
History     CSN: [id number]  Arrival date & time 02/09/12  0410   First MD Initiated Contact with Patient 02/09/12 [phone]      Chief Complaint  Patient presents with  . Chest Pain  . Shortness of Breath  . Fever    (Consider location/radiation/quality/duration/timing/severity/associated sxs/prior treatment) HPI 51 year old male presents to emergency department complaining of chest pain, shortness of breath. Patient was found to have fever upon arrival. Patient reports he's been feeling unwell since Tuesday. He had generalized weakness and doing sluggish. Patient does do to his blood pressure being too low, and started taking only one half of his blood pressure pills. Patient continued to feel off balance and unwell. He felt pressure in his chest like he needed to cough but wasn't ill bringing anything. On Wednesday he was cleaning out a truck and was not wearing proper footwear and reports he felt tangled up and fell. Patient landed on his right elbow and buttock. He denies any injuries. Patient continued to feel unwell throughout the week. He has not E. unwell over the last week. He has only drank a little bit. Patient is a smoker, normal is up to 5 cigarettes a day.   Past Medical History  Diagnosis Date  . Hypertension     Past Surgical History  Procedure Date  . Knee surgery     Family History  Problem Relation Age of Onset  . Diabetes Maternal Aunt   . Diabetes Paternal Aunt     History  Substance Use Topics  . Smoking status: Current Everyday Smoker -- 25 years    Types: Cigarettes  . Smokeless tobacco: Not on file  . Alcohol Use: 1.8 oz/week    3 Shots of liquor per week      Review of Systems  All other systems reviewed and are negative.    Allergies  Review of patient's allergies indicates no known allergies.  Home Medications   Current Outpatient Rx  Name Route Sig Dispense Refill  . ESOMEPRAZOLE MAGNESIUM 40 MG PO CPDR Oral Take 40 mg by mouth daily  as needed. For acid reflux    . TRIAMTERENE-HCTZ 37.5-25 MG PO TABS Oral Take 1 each (1 tablet total) by mouth daily. 30 tablet 0    BP 129/69  Pulse 86  Temp 100.4 F (38 C) (Oral)  Resp 35  SpO2 94%  Physical Exam  Nursing note and vitals reviewed. Constitutional: He is oriented to person, place, and time. He appears well-developed and well-nourished. He appears distressed (tremulous, sick appearing).  HENT:  Head: Normocephalic and atraumatic.  Nose: Nose normal.  Mouth/Throat: Oropharynx is clear and moist. No oropharyngeal exudate.  Eyes: Conjunctivae are normal. Pupils are equal, round, and reactive to light.  Neck: Normal range of motion. Neck supple. No JVD present. No tracheal deviation present. No thyromegaly present.  Cardiovascular: Regular rhythm, normal heart sounds and intact distal pulses.  Exam reveals no gallop and no friction rub.   No murmur heard.      Tachycardia noted  Pulmonary/Chest: No stridor. He is in respiratory distress. He has no wheezes. He has no rales. He exhibits no tenderness.       Tachypnea, rhonchi in left lung fields  Abdominal: Soft. Bowel sounds are normal. He exhibits no distension and no mass. There is no tenderness. There is no rebound and no guarding.  Musculoskeletal: Normal range of motion. He exhibits no edema and no tenderness.  Lymphadenopathy:    He has no  cervical adenopathy.  Neurological: He is alert and oriented to person, place, and time.  Skin: Skin is warm. No rash noted. He is diaphoretic. No erythema. No pallor.  Psychiatric: He has a normal mood and affect. His behavior is normal. Judgment and thought content normal.    ED Course  Procedures (including critical care time)   Date: 02/09/2012  Rate: 124    Rhythm sinus tachycardia  QRS Axis: normal  Intervals: normal  ST/T Wave abnormalities: normal  Conduction Disutrbances:none  Narrative Interpretation: LVH Noted.  Rate faster than prior  Old EKG Reviewed:  changes noted    Labs Reviewed  CBC WITH DIFFERENTIAL - Abnormal; Notable for the following:    WBC 12.6 (*)     RBC 4.07 (*)     HCT 37.3 (*)     Neutrophils Relative 88 (*)     Neutro Abs 11.0 (*)     Lymphocytes Relative 7 (*)     All other components within normal limits  POCT I-STAT, CHEM 8 - Abnormal; Notable for the following:    Sodium 127 (*)     Potassium 3.0 (*)     Chloride 87 (*)     BUN 27 (*)     Creatinine, Ser 2.10 (*)     Glucose, Bld 198 (*)     Calcium, Ion 0.92 (*)     All other components within normal limits  BASIC METABOLIC PANEL - Abnormal; Notable for the following:    Sodium 126 (*)     Potassium 2.8 (*)     Chloride 87 (*)     Glucose, Bld 188 (*)     BUN 29 (*)     Creatinine, Ser 1.98 (*)     Calcium 7.6 (*)     GFR calc non Af Amer 37 (*)     GFR calc Af Amer 43 (*)     All other components within normal limits  HEPATIC FUNCTION PANEL - Abnormal; Notable for the following:    Albumin 2.3 (*)     AST 109 (*)     Total Bilirubin 1.9 (*)     Bilirubin, Direct 0.7 (*)     Indirect Bilirubin 1.2 (*)     All other components within normal limits  CULTURE, BLOOD (ROUTINE X 2)  CULTURE, BLOOD (ROUTINE X 2)  URINALYSIS, ROUTINE W REFLEX MICROSCOPIC   Dg Chest 2 View  02/09/2012  *RADIOLOGY REPORT*  Clinical Data: Left chest pain, cough.  CHEST - 2 VIEW  Comparison: None.  Findings: left upper lobe consolidation and mass like consolidation along the fissure.  No dependent pleural effusion. No pneumothorax. Cardiac contour within normal range. Left hilar fullness.  No acute osseous finding.  IMPRESSION: Left lung consolidation may reflect pneumonia.  Underlying mass not excluded.  Recommend radiographic follow-up after therapy.  Original Report Authenticated By: Waneta Martins, M.D.     1. Community acquired pneumonia   2. Hyponatremia   3. Hypokalemia   4. Acute renal failure       MDM  51 year old male who presents with general  malaise for one week. Patient to have a left-sided pneumonia, hyponatremia,  hypokalemia and acute renal failure. Discussed with internal medicine residents for admission. Patient to receive Rocephin and azithromycin. He will have his potassium replaced. Patient and wife updated on findings and plan        Olivia Mackie, MD 02/09/12 7829  Olivia Mackie, MD 03/08/12 [phone]

## 2012-02-09 NOTE — Progress Notes (Addendum)
02/09/2012 patient came in this morning, he's a fall risk. Patient refuse to have the bed alarm turn on. Patient did not feel like watching safety video. Biochemist, clinical.

## 2012-02-09 NOTE — ED Notes (Signed)
Pt states that he fell off a dump truck on Wed. Pt states that his left side of his chest has been hurting since. Pt states hurts worse to take a deep breath. Pt denies feeling of SOB, productive cough.

## 2012-02-10 LAB — BASIC METABOLIC PANEL
BUN: 26 mg/dL — ABNORMAL HIGH (ref 6–23)
Calcium: 7.3 mg/dL — ABNORMAL LOW (ref 8.4–10.5)
Creatinine, Ser: 1.46 mg/dL — ABNORMAL HIGH (ref 0.50–1.35)
GFR calc Af Amer: 63 mL/min — ABNORMAL LOW (ref >90)
GFR calc non Af Amer: 54 mL/min — ABNORMAL LOW (ref >90)

## 2012-02-10 LAB — CBC
Hemoglobin: 10.2 g/dL — ABNORMAL LOW (ref 13.0–17.0)
MCH: 32.5 pg (ref 26.0–34.0)
MCHC: 34.3 g/dL (ref 30.0–36.0)
Platelets: 145 10*3/uL — ABNORMAL LOW (ref 150–400)
RDW: 13.6 % (ref 11.5–15.5)

## 2012-02-10 LAB — LEGIONELLA ANTIGEN, URINE: Legionella Antigen, Urine: POSITIVE

## 2012-02-10 MED ORDER — DEXTROSE 5 % IV SOLN
1.0000 g | INTRAVENOUS | Status: DC
  Filled 2012-02-10: qty 10

## 2012-02-10 MED ORDER — POTASSIUM CHLORIDE CRYS ER 20 MEQ PO TBCR
40.0000 meq | EXTENDED_RELEASE_TABLET | Freq: Once | ORAL | Status: AC
  Administered 2012-02-10: 12:00:00 via ORAL
  Administered 2012-02-10: 40 meq via ORAL

## 2012-02-10 MED ORDER — POTASSIUM CHLORIDE CRYS ER 20 MEQ PO TBCR
EXTENDED_RELEASE_TABLET | ORAL | Status: AC
  Filled 2012-02-10: qty 2

## 2012-02-10 NOTE — Progress Notes (Addendum)
Subjective: Pt laying in bed asleep when I enter. Not on supplemental O2, in no resp distress. Says he feels "much better," and that breathing has improved this morning. Still has some L sided pain w deep inspiration. Only other complaint he reports is tiredness.   Spiked Tmax 101.8 overnight, desatted to 87% on RA. Denies N/V/D, abdominal pain, dizziness.   Objective: Vital signs in last 24 hours: Filed Vitals:   02/10/12 0006 02/10/12 0524 02/10/12 0930 02/10/12 0947  BP:  127/70 149/82   Pulse:  83 86   Temp: 99.1 F (37.3 C) 99.6 F (37.6 C) 100.4 F (38 C) 100.2 F (37.9 C)  TempSrc: Oral Oral Oral Oral  Resp:  20 20   Height:      Weight:      SpO2:  91% 92%    Weight change:   Intake/Output Summary (Last 24 hours) at 02/10/12 1111 Last data filed at 02/10/12 0900  Gross per 24 hour  Intake 2433.34 ml  Output   1200 ml  Net 1233.34 ml   General:lying in bed,  NAD HEENT: PERRL, EOMI Cardiac: RRR, no rubs, murmurs or gallops Pulm: Mildly increased work of breathing, deep inspiration limited 2/2 pain, bronchial breath sounds in L mid and upper lung fields, mild crackles in bilateral lung bases Abd: soft, nontender, nondistended, BS present Ext: warm and well perfused, no pedal edema Neuro: alert and oriented X3, cranial nerves II-XII grossly intact  Lab Results: Basic Metabolic Panel:  Lab 02/10/12 4098 02/09/12 0609  NA 132* 126*  K 3.1* 2.8*  CL 95* 87*  CO2 26 24  GLUCOSE 108* 188*  BUN 26* 29*  CREATININE 1.46* 1.98*  CALCIUM 7.3* 7.6*  MG -- --  PHOS -- --   Liver Function Tests:  Lab 02/09/12 0609  AST 109*  ALT 28  ALKPHOS 48  BILITOT 1.9*  PROT 6.2  ALBUMIN 2.3*   CBC:  Lab 02/10/12 0505 02/09/12 0441 02/09/12 0431  WBC 15.0* -- 12.6*  NEUTROABS -- -- 11.0*  HGB 10.2* 14.6 --  HCT 29.7* 43.0 --  MCV 94.6 -- 91.6  PLT 145* -- 160   Cardiac Enzymes:  Lab 02/10/12 0505 02/09/12 1000  CKTOTAL 3601* 4514*  CKMB -- --  CKMBINDEX --  --  TROPONINI -- --   Hemoglobin A1C:  Lab 02/09/12 1000  HGBA1C 5.7*   Thyroid Function Tests:  Lab 02/09/12 1000  TSH 1.137  T4TOTAL --  FREET4 --  T3FREE --  THYROIDAB --   Urinalysis:  Lab 02/09/12 0901  COLORURINE ORANGE*  LABSPEC 1.015  PHURINE 5.5  GLUCOSEU NEGATIVE  HGBUR LARGE*  BILIRUBINUR NEGATIVE  KETONESUR NEGATIVE  PROTEINUR 100*  UROBILINOGEN 1.0  NITRITE NEGATIVE  LEUKOCYTESUR NEGATIVE    Micro Results: Recent Results (from the past 240 hour(s))  CULTURE, BLOOD (ROUTINE X 2)     Status: Normal (Preliminary result)   Collection Time   02/09/12  6:13 AM      Component Value Range Status Comment   Specimen Description BLOOD LEFT ARM   Final    Special Requests BOTTLES DRAWN AEROBIC AND ANAEROBIC   Final    Culture  Setup Time 02/09/2012 11:26   Final    Culture     Final    Value:        BLOOD CULTURE RECEIVED NO GROWTH TO DATE CULTURE WILL BE HELD FOR 5 DAYS BEFORE ISSUING A FINAL NEGATIVE REPORT   Report Status PENDING  Incomplete   CULTURE, BLOOD (ROUTINE X 2)     Status: Normal (Preliminary result)   Collection Time   02/09/12  6:18 AM      Component Value Range Status Comment   Specimen Description BLOOD LEFT HAND   Final    Special Requests BOTTLES DRAWN AEROBIC AND ANAEROBIC   Final    Culture  Setup Time 02/09/2012 11:26   Final    Culture     Final    Value:        BLOOD CULTURE RECEIVED NO GROWTH TO DATE CULTURE WILL BE HELD FOR 5 DAYS BEFORE ISSUING A FINAL NEGATIVE REPORT   Report Status PENDING   Incomplete    Studies/Results: Dg Chest 2 View  02/09/2012  *RADIOLOGY REPORT*  Clinical Data: Left chest pain, cough.  CHEST - 2 VIEW  Comparison: None.  Findings: left upper lobe consolidation and mass like consolidation along the fissure.  No dependent pleural effusion. No pneumothorax. Cardiac contour within normal range. Left hilar fullness.  No acute osseous finding.  IMPRESSION: Left lung consolidation may reflect pneumonia.   Underlying mass not excluded.  Recommend radiographic follow-up after therapy.  Original Report Authenticated By: Waneta Martins, M.D.   Ct Chest W Contrast  02/09/2012  *RADIOLOGY REPORT*  Clinical Data: Chest pain, shortness of breath, fever and weakness. Abnormal chest radiograph.  CT CHEST WITH CONTRAST  Technique:  Multidetector CT imaging of the chest was performed following the standard protocol during bolus administration of intravenous contrast.  Contrast: 60mL OMNIPAQUE IOHEXOL 300 MG/ML  SOLN  Comparison: Chest radiograph 02/09/2012.  Findings: Mediastinal lymph nodes measure up to 9 mm in short axis in the AP window.  Left hilar lymph nodes measure up to 10 mm.  No axillary adenopathy.  Heart is at the upper limits of normal in size.  No pericardial effusion.  Biapical pleural parenchymal scarring with mild emphysematous change.  Dense airspace consolidation with air bronchograms in the left upper and left lower lobes. Surrounding ground-glass is noted as well.  Tiny left pleural effusion.  A 2 mm nodule along the minor fissure (image 32) is nonspecific.  Minimal subpleural atelectasis is seen dependently in the right lower lobe.  Airway is unremarkable.  Incidental imaging of the upper abdomen shows low attenuation throughout the visualized portion of the liver. Some areas of vague hyper attenuation are seen in the dome of the liver (image 53) and in the left hepatic lobe (image 64), incompletely imaged.  Mild thickening of the right adrenal gland. No worrisome lytic or sclerotic lesions.  IMPRESSION:  1.  Dense airspace consolidation and surrounding ground-glass in the left upper and left lower lobes, most consistent with pneumonia.  Radiographic follow-up to clearing is recommended. 2.  Tiny left pleural effusion. 3.  Hepatic steatosis.  Subtle areas of parenchymal increased attenuation are nonspecific.  MR abdomen without and with contrast could be performed in further evaluation, as  clinically indicated.  Original Report Authenticated By: Reyes Ivan, M.D.   Medications: I have reviewed the patient's current medications. Scheduled Meds:   . azithromycin  500 mg Intravenous Q24H  . cefTRIAXone (ROCEPHIN)  IV  1 g Intravenous Q24H  . enoxaparin (LOVENOX) injection  40 mg Subcutaneous Q24H  . folic acid  1 mg Oral Daily  . multivitamin with minerals  1 tablet Oral Daily  . pantoprazole  40 mg Oral Daily  . potassium chloride  40 mEq Oral Once  . thiamine  100  mg Oral Daily   Or  . thiamine  100 mg Intravenous Daily  . DISCONTD: enoxaparin (LOVENOX) injection  30 mg Subcutaneous Q24H  . DISCONTD: potassium chloride  40 mEq Oral BID   Continuous Infusions:   . sodium chloride 200 mL/hr at 02/09/12 2302   Followed by  . sodium chloride 125 mL/hr at 02/10/12 0547   PRN Meds:.acetaminophen, LORazepam, LORazepam, morphine injection, prochlorperazine  Assessment/Plan: 1. Community acquired pneumonia:  Justin Nolan is a 51 year old man with a PMH significant for hypertension who present with fever, chills, cough, shortness of breath, and CXR findings and CT findings consistent with pneumonia. On admission he had PSI Class IV, conferring 2.8% in hospital mortality risk.  He was started on azithromycin and rocephin IV yesterday. He spiked one fever to 101 last night. This morning WBC trending up 12-->15. Overall, however, he says he feels much better. Respiration rate decreased to 20 this morning, and he is satting 91-92% on room air.   - Continue Rocephin and azithromycin  - Urine strep negative, legionella pending - Blood cultures pending.   2. Acute Kidney Injury: Creatinine on admission was 1.98 and BUN was 29. Previous baseline done back in May of 2013 was 0.98. UA shows granular casts, large Hb and 0-2 WBC. CK was 4500 on admission. Suspect ATN 2/2 rhabdomyolysis. FeNa is more suggestive of prerenal AKI at 0.11, pt has been off of diuretic for several days and w  decreased po intake. Urine Osms not consistent with SIADH. - Hydrate with NS at 200 ml/hr for 15 hours then 125 per hour   3. Rhabdomyolysis Pt w CK of 4500 and suspected ATN 2/2 rhabdo. CK trended down to 3600 this morning w IVF NS @200cc /hr. Etiology unclear - EtOH use vs fall vs immobility.  - Hydrate with NS at 200 ml/hr for 15 hours then 125 per hour   4. Hypertension:  On admission, he was tachycardic, hyponatremic, hypochloremic, and hypokalemic which likely indicate volume depletion. We held his triamterene-HCTZ while repleting him. Will restart after volume resuscitation.  5. Tobacco and alcohol abuse:  He would likely benefit from counseling concerning his tobacco and binge alcohol use. We will discuss this with him and see if he wants contact with support to help him quit prior to discharge.   6. VTE: Lovenox   LOS: 1 day   Bronson Curb 02/10/2012, 11:11 AM

## 2012-02-10 NOTE — H&P (Signed)
Internal Medicine Attending Admission Note Date: 02/10/2012  Patient name: Justin Nolan Medical record number: [id number] Date of birth: [birth year] Age: 51 y.o. Gender: male  I saw and evaluated the patient. I reviewed the resident's note and I agree with the resident's findings and plan as documented in the resident's note.  Chief Complaint(s): Chest pain, shortness of breath, fevers, and cough x 5 days.  History - key components related to admission:  Justin Nolan is a 51 year old man with a history of hypertension, GERD, tobacco abuse, and binge drinking who presents with a five-day history of chest pain that is pleuritic in nature, shortness of breath, fevers, and a cough. He has also felt lightheaded with walking and standing. He denies any nausea, vomiting, diarrhea, or abdominal pain. He also denies any sick contacts and stopped his diuretic when he started feeling lightheaded. He's never had similar pain before.  Physical Exam - key components related to admission:  Filed Vitals:   02/10/12 1125 02/10/12 1411 02/10/12 1540 02/10/12 1800  BP:  132/78  128/76  Pulse:  82  80  Temp: 98.2 F (36.8 C) 100.2 F (37.9 C) 99.3 F (37.4 C) 99 F (37.2 C)  TempSrc: Oral Oral Oral Oral  Resp:  20  20  Height:      Weight:      SpO2:  93%  96%   Gen.: Well-developed, well-nourished, man lying comfortably in the bed in no acute distress. Lungs: Bronchial breath sounds with rhonchi in the left anterior and postero-lateral aspects of the hemithorax. The right lung is clear. Heart: Regular rate and rhythm without murmurs, rubs, or gallops. Abdomen: Soft, nontender, active bowel sounds without masses. Extremities: Without edema.  Lab results:  Basic Metabolic Panel:  Basename 02/10/12 0505 02/09/12 0609  NA 132* 126*  K 3.1* 2.8*  CL 95* 87*  CO2 26 24  GLUCOSE 108* 188*  BUN 26* 29*  CREATININE 1.46* 1.98*  CALCIUM 7.3* 7.6*  MG -- --  PHOS -- --   Liver Function  Tests:  [facility] 02/09/12 0609  AST 109*  ALT 28  ALKPHOS 48  BILITOT 1.9*  PROT 6.2  ALBUMIN 2.3*   CBC:  Basename 02/10/12 0505 02/09/12 0441 02/09/12 0431  WBC 15.0* -- 12.6*  NEUTROABS -- -- 11.0*  HGB 10.2* 14.6 --  HCT 29.7* 43.0 --  MCV 94.6 -- 91.6  PLT 145* -- 160   Cardiac Enzymes:  Basename 02/10/12 0505 02/09/12 1000  CKTOTAL 3601* 4514*  CKMB -- --  CKMBINDEX -- --  TROPONINI -- --   Hemoglobin A1C:  Basename 02/09/12 1000  HGBA1C 5.7*   Thyroid Function Tests:  Basename 02/09/12 1000  TSH 1.137  T4TOTAL --  FREET4 --  T3FREE --  THYROIDAB --   Urinalysis:  Hemoglobin large, protein 100, nitrite negative, leukocytes negative, 0-2 white blood cells per high-power field, 0-2 red blood cells per high-power field, granular casts.  Misc. Labs:  Legionella urinary antigen: Positive Strep pneumo urinary antigen: Negative Urine sodium less than 10 Osmolality urine 355 GGT 83 LDH 475 Haptoglobin 502 HIV nonreactive  Imaging results:  Dg Chest 2 View  02/09/2012  *RADIOLOGY REPORT*  Clinical Data: Left chest pain, cough.  CHEST - 2 VIEW  Comparison: None.  Findings: left upper lobe consolidation and mass like consolidation along the fissure.  No dependent pleural effusion. No pneumothorax. Cardiac contour within normal range. Left hilar fullness.  No acute osseous finding.  IMPRESSION: Left lung consolidation may reflect  pneumonia.  Underlying mass not excluded.  Recommend radiographic follow-up after therapy.  Original Report Authenticated By: Waneta Martins, M.D.   Ct Chest W Contrast  02/09/2012  *RADIOLOGY REPORT*  Clinical Data: Chest pain, shortness of breath, fever and weakness. Abnormal chest radiograph.  CT CHEST WITH CONTRAST  Technique:  Multidetector CT imaging of the chest was performed following the standard protocol during bolus administration of intravenous contrast.  Contrast: 60mL OMNIPAQUE IOHEXOL 300 MG/ML  SOLN  Comparison:  Chest radiograph 02/09/2012.  Findings: Mediastinal lymph nodes measure up to 9 mm in short axis in the AP window.  Left hilar lymph nodes measure up to 10 mm.  No axillary adenopathy.  Heart is at the upper limits of normal in size.  No pericardial effusion.  Biapical pleural parenchymal scarring with mild emphysematous change.  Dense airspace consolidation with air bronchograms in the left upper and left lower lobes. Surrounding ground-glass is noted as well.  Tiny left pleural effusion.  A 2 mm nodule along the minor fissure (image 32) is nonspecific.  Minimal subpleural atelectasis is seen dependently in the right lower lobe.  Airway is unremarkable.  Incidental imaging of the upper abdomen shows low attenuation throughout the visualized portion of the liver. Some areas of vague hyper attenuation are seen in the dome of the liver (image 53) and in the left hepatic lobe (image 64), incompletely imaged.  Mild thickening of the right adrenal gland. No worrisome lytic or sclerotic lesions.  IMPRESSION:  1.  Dense airspace consolidation and surrounding ground-glass in the left upper and left lower lobes, most consistent with pneumonia.  Radiographic follow-up to clearing is recommended. 2.  Tiny left pleural effusion. 3.  Hepatic steatosis.  Subtle areas of parenchymal increased attenuation are nonspecific.  MR abdomen without and with contrast could be performed in further evaluation, as clinically indicated.  Original Report Authenticated By: Reyes Ivan, M.D.   Other results:  EKG: Junctional tachycardia at 124 beats per minute, normal axis, normal intervals, no Q waves, no LVH by voltage, poor R wave progression, no ST-T changes. Except for the junctional tachycardia the EKG is unchanged from the tracing on 11/25/2011.  Assessment & Plan by Problem:  Justin Nolan is a 51 year old man with a history of hypertension, tobacco abuse, gastroesophageal reflux disease, and binge drinking who presents with a  five-day history of chest pain, shortness of breath, fevers, and cough. He was found to have a dense consolidation in the left upper lobe and lateral aspects of the left lower lobe including the superior subsegment. His Legionella urinary antigen was positive. He had no gastrointestinal symptoms. He has mild elevation in his LFTs. A presumptive diagnosis of Legionella pneumonia is made.  1) Legionella pneumonia: We will continue the azithromycin 500 mg IV every 24 hours until he defervesces for at least 24 hours. We will then complete a 10 day course of azithromycin with the dose being 500 mg daily for the rest of the regimen. We will check a phosphorus to assess if he has hypophosphatemia associated with his Legionella pneumonia.  2) Acute kidney injury: Likely related to prerenal azotemia. We'll continue IV hydration and following serial creatinines. I suspect his creatinine should normalize with this hydration. Once his renal disease improves we will restart his diuretic.  3) Disposition: I suspect Mr. Zurn will start feeling better in the next 24-48 hours. Once he is well enough to care for himself at home and his appetite improves we will discharge him  on the oral antibiotic regimen as noted above.

## 2012-02-10 NOTE — Progress Notes (Signed)
Pt informed of safety measures used to prevent fall. Told to call when needing to get OOB for assistance. Pt made aware that he is high fall risk d/t recent reported fall at home. Pt refusing bed alarm and not wanted to watch safety vidoe at this time d/t  Headache. Will continue to monitor patient.

## 2012-02-10 NOTE — Progress Notes (Signed)
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 MD was notify and is aware. Biochemist, clinical.

## 2012-02-11 DIAGNOSIS — A481 Legionnaires' disease: Secondary | ICD-10-CM | POA: Diagnosis present

## 2012-02-11 DIAGNOSIS — M6282 Rhabdomyolysis: Secondary | ICD-10-CM

## 2012-02-11 LAB — BASIC METABOLIC PANEL
Calcium: 7.7 mg/dL — ABNORMAL LOW (ref 8.4–10.5)
GFR calc Af Amer: >90 mL/min (ref >90)
GFR calc non Af Amer: 78 mL/min — ABNORMAL LOW (ref >90)
Glucose, Bld: 123 mg/dL — ABNORMAL HIGH (ref 70–99)
Sodium: 136 mEq/L (ref 135–145)

## 2012-02-11 LAB — CBC
MCH: 33.2 pg (ref 26.0–34.0)
Platelets: 179 10*3/uL (ref 150–400)
RBC: 3.13 MIL/uL — ABNORMAL LOW (ref 4.22–5.81)

## 2012-02-11 LAB — PHOSPHORUS: Phosphorus: 1.5 mg/dL — ABNORMAL LOW (ref 2.3–4.6)

## 2012-02-11 MED ORDER — MAGNESIUM SULFATE 40 MG/ML IJ SOLN
2.0000 g | Freq: Once | INTRAMUSCULAR | Status: AC
  Administered 2012-02-11: 2 g via INTRAVENOUS
  Filled 2012-02-11: qty 50

## 2012-02-11 MED ORDER — K PHOS MONO-SOD PHOS DI & MONO 155-852-130 MG PO TABS
500.0000 mg | ORAL_TABLET | Freq: Three times a day (TID) | ORAL | Status: AC
  Administered 2012-02-11 (×3): 500 mg via ORAL
  Filled 2012-02-11 (×3): qty 2

## 2012-02-11 MED ORDER — POTASSIUM CHLORIDE CRYS ER 20 MEQ PO TBCR
40.0000 meq | EXTENDED_RELEASE_TABLET | Freq: Once | ORAL | Status: AC
  Administered 2012-02-11: 40 meq via ORAL

## 2012-02-11 MED ORDER — POTASSIUM CHLORIDE CRYS ER 20 MEQ PO TBCR
EXTENDED_RELEASE_TABLET | ORAL | Status: AC
  Filled 2012-02-11: qty 2

## 2012-02-11 MED ORDER — AZITHROMYCIN 500 MG PO TABS
500.0000 mg | ORAL_TABLET | Freq: Once | ORAL | Status: AC
  Administered 2012-02-11: 500 mg via ORAL
  Filled 2012-02-11 (×2): qty 1

## 2012-02-11 MED ORDER — AZITHROMYCIN 500 MG PO TABS
500.0000 mg | ORAL_TABLET | Freq: Every day | ORAL | Status: DC
  Administered 2012-02-12: 500 mg via ORAL
  Filled 2012-02-11 (×3): qty 1

## 2012-02-11 NOTE — Progress Notes (Signed)
Subjective: Pt standing at mirror shaving when I enter. Per nursing, carried IV pole into shower yesterday to shower. Says he feels antsy being the hospital because his breathing is so much easier now. He is not on supplemental O2, and is ambulating without difficulty, maintaining sats of 93%.  Still has some limitation of deep inspiration 2/2 L sided pain.  Last fever was at 0930 yesterday (100.4). Urine was + for legionella antigen, serogroup 1. Rocephin was discontinued, and he was continued on azithromycin.  Denies N/V/D, abdominal pain, dizziness.   Objective: Vital signs in last 24 hours: Filed Vitals:   02/10/12 1540 02/10/12 1800 02/10/12 2151 02/11/12 0546  BP:  128/76 148/86 153/81  Pulse:  80 81 83  Temp: 99.3 F (37.4 C) 99 F (37.2 C) 98.8 F (37.1 C) 99.3 F (37.4 C)  TempSrc: Oral Oral  Oral  Resp:  20 18 19   Height:      Weight:   210 lb 8.6 oz (95.5 kg)   SpO2:  96% 90% 93%   Weight change: 1 lb 8.7 oz (0.7 kg)  Intake/Output Summary (Last 24 hours) at 02/11/12 0757 Last data filed at 02/11/12 0548  Gross per 24 hour  Intake    720 ml  Output    775 ml  Net    -55 ml   General: Standing at mirror shaving, in NAD Cardiac: RRR, no rubs, murmurs or gallops Pulm:Bronchial breath sounds in L mid and upper lung fields (improved from yesterday), mild crackles in bilateral lung bases Abd: soft, nontender, nondistended, BS present Ext: warm and well perfused, no pedal edema Neuro: alert and oriented X3, cranial nerves II-XII grossly intact  Lab Results: Basic Metabolic Panel:  Lab 02/11/12 1610 02/10/12 0505  NA 136 132*  K 3.0* 3.1*  CL 98 95*  CO2 27 26  GLUCOSE 123* 108*  BUN 16 26*  CREATININE 1.08 1.46*  CALCIUM 7.7* 7.3*  MG -- --  PHOS 1.5* --   Liver Function Tests:  Lab 02/09/12 0609  AST 109*  ALT 28  ALKPHOS 48  BILITOT 1.9*  PROT 6.2  ALBUMIN 2.3*   CBC:  Lab 02/11/12 0540 02/10/12 0505 02/09/12 0431  WBC 19.0* 15.0* --  NEUTROABS  -- -- 11.0*  HGB 10.4* 10.2* --  HCT 30.1* 29.7* --  MCV 96.2 94.6 --  PLT 179 145* --   Cardiac Enzymes:  Lab 02/11/12 0540 02/10/12 0505 02/09/12 1000  CKTOTAL 1426* 3601* 4514*  CKMB -- -- --  CKMBINDEX -- -- --  TROPONINI -- -- --   Hemoglobin A1C:  Lab 02/09/12 1000  HGBA1C 5.7*   Thyroid Function Tests:  Lab 02/09/12 1000  TSH 1.137  T4TOTAL --  FREET4 --  T3FREE --  THYROIDAB --   Urinalysis:  Lab 02/09/12 0901  COLORURINE ORANGE*  LABSPEC 1.015  PHURINE 5.5  GLUCOSEU NEGATIVE  HGBUR LARGE*  BILIRUBINUR NEGATIVE  KETONESUR NEGATIVE  PROTEINUR 100*  UROBILINOGEN 1.0  NITRITE NEGATIVE  LEUKOCYTESUR NEGATIVE    Micro Results: Recent Results (from the past 240 hour(s))  CULTURE, BLOOD (ROUTINE X 2)     Status: Normal (Preliminary result)   Collection Time   02/09/12  6:13 AM      Component Value Range Status Comment   Specimen Description BLOOD LEFT ARM   Final    Special Requests BOTTLES DRAWN AEROBIC AND ANAEROBIC   Final    Culture  Setup Time 02/09/2012 11:26   Final  Culture     Final    Value:        BLOOD CULTURE RECEIVED NO GROWTH TO DATE CULTURE WILL BE HELD FOR 5 DAYS BEFORE ISSUING A FINAL NEGATIVE REPORT   Report Status PENDING   Incomplete   CULTURE, BLOOD (ROUTINE X 2)     Status: Normal (Preliminary result)   Collection Time   02/09/12  6:18 AM      Component Value Range Status Comment   Specimen Description BLOOD LEFT HAND   Final    Special Requests BOTTLES DRAWN AEROBIC AND ANAEROBIC   Final    Culture  Setup Time 02/09/2012 11:26   Final    Culture     Final    Value:        BLOOD CULTURE RECEIVED NO GROWTH TO DATE CULTURE WILL BE HELD FOR 5 DAYS BEFORE ISSUING A FINAL NEGATIVE REPORT   Report Status PENDING   Incomplete    Studies/Results: Ct Chest W Contrast  02/09/2012  *RADIOLOGY REPORT*  Clinical Data: Chest pain, shortness of breath, fever and weakness. Abnormal chest radiograph.  CT CHEST WITH CONTRAST   Technique:  Multidetector CT imaging of the chest was performed following the standard protocol during bolus administration of intravenous contrast.  Contrast: 60mL OMNIPAQUE IOHEXOL 300 MG/ML  SOLN  Comparison: Chest radiograph 02/09/2012.  Findings: Mediastinal lymph nodes measure up to 9 mm in short axis in the AP window.  Left hilar lymph nodes measure up to 10 mm.  No axillary adenopathy.  Heart is at the upper limits of normal in size.  No pericardial effusion.  Biapical pleural parenchymal scarring with mild emphysematous change.  Dense airspace consolidation with air bronchograms in the left upper and left lower lobes. Surrounding ground-glass is noted as well.  Tiny left pleural effusion.  A 2 mm nodule along the minor fissure (image 32) is nonspecific.  Minimal subpleural atelectasis is seen dependently in the right lower lobe.  Airway is unremarkable.  Incidental imaging of the upper abdomen shows low attenuation throughout the visualized portion of the liver. Some areas of vague hyper attenuation are seen in the dome of the liver (image 53) and in the left hepatic lobe (image 64), incompletely imaged.  Mild thickening of the right adrenal gland. No worrisome lytic or sclerotic lesions.  IMPRESSION:  1.  Dense airspace consolidation and surrounding ground-glass in the left upper and left lower lobes, most consistent with pneumonia.  Radiographic follow-up to clearing is recommended. 2.  Tiny left pleural effusion. 3.  Hepatic steatosis.  Subtle areas of parenchymal increased attenuation are nonspecific.  MR abdomen without and with contrast could be performed in further evaluation, as clinically indicated.  Original Report Authenticated By: Reyes Ivan, M.D.   Medications: I have reviewed the patient's current medications. Scheduled Meds:    . azithromycin  500 mg Intravenous Q24H  . enoxaparin (LOVENOX) injection  40 mg Subcutaneous Q24H  . folic acid  1 mg Oral Daily  . multivitamin with  minerals  1 tablet Oral Daily  . pantoprazole  40 mg Oral Daily  . potassium chloride  40 mEq Oral Once  . thiamine  100 mg Oral Daily   Or  . thiamine  100 mg Intravenous Daily  . DISCONTD: cefTRIAXone (ROCEPHIN)  IV  1 g Intravenous Q24H  . DISCONTD: cefTRIAXone (ROCEPHIN)  IV  1 g Intravenous Q24H   Continuous Infusions:    . sodium chloride 1,000 mL (02/11/12 0035)  PRN Meds:.acetaminophen, LORazepam, LORazepam, morphine injection, prochlorperazine  Assessment/Plan: 1. Community acquired pneumonia - Legionella:  Mr. Justin Nolan is a 51 year old man with a PMH significant for hypertension who present with fever, chills, cough, shortness of breath, and CXR findings and CT findings consistent with pneumonia. On admission he had PSI Class IV, conferring 2.8% in hospital mortality risk.  He was started on azithromycin and rocephin IV 2 days ago. Yesterday, urine Ag test returned + for serogroup 1 legionella pneumophilia. Rocephin was discontinued and he was maintained on IV azithromycin. His last fever was 100.4 at 0930 yesterday. This morning WBC trending up 12-->15-->19. Overall, however, he says he feels much better. Respiration rate decreased to 19 this morning, he is ambulating without difficulty, and he is satting 93% on room air.  We will give extra po dose of azithromycin today as he did not get loading dose in ED, and then transition him to po azithromycin 500mg  once daily as he has been afebrile >24 hrs. He will need a 10 day course. His phos level was checked as this can be low with legionella infection (and also EtOH abuse). Returned at 1.5. Repleted w 500po TID x1. - Continue azithromycin. He has received 3/10 days of therapy so far.  - Blood cultures pending, NGTD.  - If remains afebrile tomorrow w continued clinical improvement, can consider d/c home.  2. Acute Kidney Injury: Creatinine on admission was 1.98 and BUN was 29. Previous baseline done back in May of 2013 was 0.98. UA  showed granular casts, large Hb and 0-2 WBC. CK was 4500 on admission. Suspect ATN 2/2 rhabdomyolysis. FeNa is more suggestive of prerenal AKI at 0.11, pt has been off of diuretic for several days and w decreased po intake. Urine Osms not consistent with SIADH.  He was hydrated with NS at 200 ml/hr for 15 hours then 125 per hour.  Renal function has improved w hydration and his Cr this am is 1.0. - Resolved  3. Rhabdomyolysis Pt w CK of 4500 and suspected ATN 2/2 rhabdo. CK trended down to 3600 yesterday and 1400 today w IVF. Etiology unclear - EtOH use vs fall vs immobility.  - Will stop IV today.  4. Hypertension:  On admission, he was tachycardic, hyponatremic, hypochloremic, and hypokalemic which likely indicated volume depletion. We held his triamterene-HCTZ while repleting him. Discussed changing his antihypertensive to one that would be less likely to volume deplete him, but he prefers to continue w current therapy. Will restart after volume resuscitation.  5. Hypokalemia Pt's K low on admission (2.8) remains low (3.0 today), despite daily repletion. Possibly due to concomitant low Mg, as pt w h/o EtOH abuse.  - Gave 2mg  IV MgSo4 - Repleting prn with Kdur - Daily Bmets  5. Tobacco and alcohol abuse:  He would likely benefit from counseling concerning his tobacco and binge alcohol use. We will discuss this with him and see if he wants contact with support to help him quit prior to discharge.   6. VTE: Lovenox   LOS: 2 days   Bronson Curb 02/11/2012, 7:57 AM

## 2012-02-11 NOTE — Progress Notes (Signed)
CM following for progression and d/c planning, noted plan for po antibiotics, and pt is adm as selfpay. CM will discuss d/c meds with pt when plan is firm and pt is eligible for assistance with medications if needed. Will continue to follow. Johny Shock RN MPH Case Manager [phone]

## 2012-02-11 NOTE — Progress Notes (Signed)
Internal Medicine Attending  Date: 02/11/2012  Patient name: Justin Nolan Medical record number: [id number] Date of birth: March 01, 1961 Age: 51 y.o. Gender: male  I saw and evaluated the patient. I reviewed the resident's note by Dr. Heloise Beecham and I agree with the resident's findings and plans as documented in her progress note.  [name] states his dyspnea is much improved this AM as is his pleuritic chest pain.  He was informed of his diagnosis of Legionella Pneumonia and the change in his regimen.  He was transitioned to oral azithromycin 500 mg PO QD today and will need to complete a 10 day course at that dose.  I anticipate he will be ready for discharge in the next 24 hours.

## 2012-02-11 NOTE — Discharge Summary (Signed)
Internal Medicine Teaching [facility] Discharge Note  Name: Justin Nolan MRN: [id number] DOB: [birth year] 51 y.o.  Date of Admission: 02/09/2012  4:52 AM Date of Discharge: 02/12/2012 Attending Physician: Ulyess Mort, MD  Discharge Diagnosis: CAP - Legionella pneumonia Acute Kidney Injury Rhabdomyolysis Hypertension  Discharge Medications: Medication List  As of 02/12/2012  1:36 PM   TAKE these medications         azithromycin 500 MG tablet   Commonly known as: ZITHROMAX   Take 1 tablet (500 mg total) by mouth daily.      esomeprazole 40 MG capsule   Commonly known as: NEXIUM   Take 40 mg by mouth daily as needed. For acid reflux      multivitamin with minerals Tabs   Take 1 tablet by mouth daily.      triamterene-hydrochlorothiazide 37.5-25 MG per tablet   Commonly known as: MAXZIDE-25   Take 1 each (1 tablet total) by mouth daily.            Disposition and follow-up:   JustinJustin Nolan was discharged from [facility] in Stable condition.  At the hospital follow up visit please address  1) Resolution of pneumonia symptoms Pt will complete 10 days antibiotic therapy. Discharged on day 4/10. Should finish tx on 8/7. Please assess for resolution of symptoms. He may need a f/u CXR scheduled in 6 weeks. 2) ATN 2/2 rhabdo Pt w ATN 2/2 rhabdo, Cr had normalized by discharge and CK had decreased to 1400. Please check Bmet on pt at clinic appt. 3)Hypertension Based on problem above, please reassess BP and need for medications.  4) Alcohol abuse Please counsel pt on avoiding alcohol. He may need formal CSW referral.   Follow-up Appointments: Follow-up Information    Follow up with LI, NA, MD. (at [facility] on 8/5 at 1:45PM)    Contact information:   [address] [place] [address] [phone]         Discharge Orders    Future Appointments: Provider: Department: Dept Phone: Center:   02/16/2012 1:45 PM Dede Query, MD [facility] [phone] [facility]     Future Orders Please Complete By Expires   Diet - low sodium heart healthy      Increase activity slowly      Discharge instructions      Comments:   Start azithromycin 500mg  once per day for 6 days starting tomorrow morning until you finish them all.  Continue taking deep breaths to improve your breathing Follow up with your doctor's appointment on Monday      Consultations:  None  Procedures Performed:  Dg Chest 2 View  02/09/2012  *RADIOLOGY REPORT*  Clinical Data: Left chest pain, cough.  CHEST - 2 VIEW  Comparison: None.  Findings: left upper lobe consolidation and mass like consolidation along the fissure.  No dependent pleural effusion. No pneumothorax. Cardiac contour within normal range. Left hilar fullness.  No acute osseous finding.  IMPRESSION: Left lung consolidation may reflect pneumonia.  Underlying mass not excluded.  Recommend radiographic follow-up after therapy.  Original Report Authenticated By: Waneta Martins, M.D.   Ct Chest W Contrast  02/09/2012  *RADIOLOGY REPORT*  Clinical Data: Chest pain, shortness of breath, fever and weakness. Abnormal chest radiograph.  CT CHEST WITH CONTRAST  Technique:  Multidetector CT imaging of the chest was performed following the standard protocol during bolus administration of intravenous contrast.  Contrast: 60mL OMNIPAQUE  IOHEXOL 300 MG/ML  SOLN  Comparison: Chest radiograph 02/09/2012.  Findings: Mediastinal lymph nodes measure up to 9 mm in short axis in the AP window.  Left hilar lymph nodes measure up to 10 mm.  No axillary adenopathy.  Heart is at the upper limits of normal in size.  No pericardial effusion.  Biapical pleural parenchymal scarring with mild emphysematous change.  Dense airspace consolidation with air bronchograms in the left upper and left lower lobes. Surrounding ground-glass is noted as well.  Tiny left pleural effusion.  A 2 mm nodule along the  minor fissure (image 32) is nonspecific.  Minimal subpleural atelectasis is seen dependently in the right lower lobe.  Airway is unremarkable.  Incidental imaging of the upper abdomen shows low attenuation throughout the visualized portion of the liver. Some areas of vague hyper attenuation are seen in the dome of the liver (image 53) and in the left hepatic lobe (image 64), incompletely imaged.  Mild thickening of the right adrenal gland. No worrisome lytic or sclerotic lesions.  IMPRESSION:  1.  Dense airspace consolidation and surrounding ground-glass in the left upper and left lower lobes, most consistent with pneumonia.  Radiographic follow-up to clearing is recommended. 2.  Tiny left pleural effusion. 3.  Hepatic steatosis.  Subtle areas of parenchymal increased attenuation are nonspecific.  MR abdomen without and with contrast could be performed in further evaluation, as clinically indicated.  Original Report Authenticated By: Reyes Ivan, M.D.    Admission HPI: Justin Nolan is a 51 year old man with a PMH significant only for HTN who presents to the [facility] ED with complaints of fever, shortness of breath, cough, and chest pain since Thursday. He states that he has never had anything like this before. The chest pain is a stabbing chest pain that gets worse whenever he takes a deep breath or coughs. He states that he has been afraid to cough vigorously because of the pain. He states that since Saturday he has had subjective fevers and chills. He also has had problems with his appetite and that he hasn't eaten anything since Thursday but has been drinking Gatorade to try to stay hydrated. He denies vomiting, diarrhea, constipation, abdominal pain, hemoptysis, blood in his stools, dysuria, chest pain on exertion, or swelling in his legs. He states that he stopped taking his medications on Tuesday because he had started to feel lightheaded when he was standing up and walking.  He has a history of binge  drinking during the weekends when he isn't working. 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 He states that he does not drink during the week when he is working and has never had a problem with withdrawal from alcohol.  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 He states that he was sore but that these now do not hurt.   Hospital Course by problem list:  1. Community acquired pneumonia - Legionella:  Justin Nolan presented with fever, chills, cough, shortness of breath, and CXR findings and CT findings consistent with pneumonia. On admission he had PSI Class IV, conferring 2.8% in hospital mortality risk. He was started on azithromycin and rocephin IV and received this therapy x 2 days. On Hospital Day 2, urine Ag test returned +  for serogroup 1 legionella pneumophilia.  Rocephin was discontinued and he was transitioned to IV azithromycin. His last fever was 100.4 at 0930 yesterday. This morning WBC trending up 12-->15-->19. Overall, however, he says he feels much better. Respiration rate decreased to 20 this morning, he is ambulating without difficulty, and he is satting 93-95% on room air.  Blood cultures show NGTD. His phos level was checked as this can be low with legionella infection (and also EtOH abuse). Returned at 1.5. Repleted w 500po TID x1.   .  2. Acute Kidney Injury:  Creatinine on admission was 1.98 and BUN was 29. Previous baseline done back in May of 2013 was 0.98. UA showed granular casts, large Hb and 0-2 WBC. CK was 4500 on admission. Suspect ATN 2/2 rhabdomyolysis. FeNa is more suggestive of prerenal AKI at 0.11, pt had been off of diuretic for several days and w decreased po intake. Urine Osms was not consistent with SIADH. He was hydrated with NS at 200 ml/hr for 15 hours then  125 per hour. Renal function has improved w hydration and his Cr normalized on Hospital Day 3.   3. Rhabdomyolysis  Pt w CK of 4500 and suspected ATN 2/2 rhabdo. CK trended down to 3600 on Hospital Day 2 and 1400 on Hospital Day 3 with IVF resuscitation. The etiology wasunclear - EtOH use vs fall vs immobility.    4. Hypertension:  On admission, he was tachycardic, hyponatremic, hypochloremic, and hypokalemic which likely indicated volume depletion. We held his triamterene-HCTZ while repleting him. This medication to be resumed upon discharge.   5. Alcohol abuse Pt was briefly counseled on not drinking while taking his PO antibiotic and until his CK trends down to normal levels. Pt was also given Alcohol FAQ with his discharge instructions.    Discharge Vitals:  BP 138/84  Pulse 91  Temp 99.3 F (37.4 C) (Oral)  Resp 20  Ht 5' 11.5" (1.816 m)  Wt 208 lb 12.4 oz (94.7 kg)  BMI 28.71 kg/m2  SpO2 95%  Discharge Day PE:   Discharge Labs:  Results for orders placed during the hospital encounter of 02/09/12 (from the past 24 hour(s))  CBC     Status: Abnormal   Collection Time   02/12/12  5:45 AM      Component Value Range   WBC 21.8 (*) 4.0 - 10.5 K/uL   RBC 3.04 (*) 4.22 - 5.81 MIL/uL   Hemoglobin 10.0 (*) 13.0 - 17.0 g/dL   HCT 40.9 (*) 81.1 - 91.4 %   MCV 95.7  78.0 - 100.0 fL   MCH 32.9  26.0 - 34.0 pg   MCHC 34.4  30.0 - 36.0 g/dL   RDW 78.2  95.6 - 21.3 %   Platelets 244  150 - 400 K/uL  BASIC METABOLIC PANEL     Status: Abnormal   Collection Time   02/12/12  5:45 AM      Component Value Range   Sodium 137  135 - 145 mEq/L   Potassium 3.4 (*) 3.5 - 5.1 mEq/L   Chloride 99  96 - 112 mEq/L   CO2 29  19 - 32 mEq/L   Glucose, Bld 123 (*) 70 - 99 mg/dL   BUN 12  6 - 23 mg/dL   Creatinine, Ser 0.86  0.50 - 1.35 mg/dL   Calcium 8.3 (*) 8.4 - 10.5 mg/dL   GFR calc non Af Amer >90  >90 mL/min   GFR calc Af Amer >90  >  90 mL/min    Signed: Ky Barban 02/12/2012,  1:36 PM   Time Spent on Discharge: 40 min

## 2012-02-12 LAB — CBC
MCH: 32.9 pg (ref 26.0–34.0)
MCHC: 34.4 g/dL (ref 30.0–36.0)
Platelets: 244 10*3/uL (ref 150–400)
RBC: 3.04 MIL/uL — ABNORMAL LOW (ref 4.22–5.81)

## 2012-02-12 LAB — BASIC METABOLIC PANEL
Calcium: 8.3 mg/dL — ABNORMAL LOW (ref 8.4–10.5)
GFR calc non Af Amer: >90 mL/min (ref >90)
Sodium: 137 mEq/L (ref 135–145)

## 2012-02-12 MED ORDER — ADULT MULTIVITAMIN W/MINERALS CH: 1.0000 | ORAL_TABLET | Freq: Every day | ORAL | Status: DC

## 2012-02-12 MED ORDER — AZITHROMYCIN 500 MG PO TABS: 500.0000 mg | ORAL_TABLET | Freq: Every day | ORAL | Status: AC

## 2012-02-12 MED ORDER — POTASSIUM CHLORIDE CRYS ER 20 MEQ PO TBCR
40.0000 meq | EXTENDED_RELEASE_TABLET | Freq: Once | ORAL | Status: AC
  Administered 2012-02-12: 40 meq via ORAL
  Filled 2012-02-12: qty 2

## 2012-02-12 NOTE — Progress Notes (Signed)
Subjective: Patient is sitting up in bed, his wife is at bedside. He had no complaints today but his wife reports that he had wheezing last night but is otherwise at his baseline as far as his mental status and respiration. He is ambulating without difficulty with SpO2 >93% without O2 supplementation.  His last fever (100.4) was on 7/30 at 930. He is tolerating PO azithromycin well.  He denies N/V/D, abdominal pain, chills, headache, or chest pain.   Objective: Vital signs in last 24 hours: Filed Vitals:   02/11/12 1426 02/11/12 1800 02/11/12 2217 02/12/12 0614  BP:  140/78 163/86 138/84  Pulse:  85 89 91  Temp: 99.4 F (37.4 C) 98.8 F (37.1 C) 98.5 F (36.9 C) 99.3 F (37.4 C)  TempSrc:  Oral Oral Oral  Resp:  18 20 20   Height:      Weight:   208 lb 12.4 oz (94.7 kg)   SpO2:  94% 95% 95%   Weight change: -1 lb 12.2 oz (-0.8 kg)  Intake/Output Summary (Last 24 hours) at 02/12/12 1045 Last data filed at 02/11/12 1700  Gross per 24 hour  Intake    480 ml  Output    350 ml  Net    130 ml   Vitals reviewed. General: sitting in bed, conversant, in NAD HEENT: no scleral icterus Cardiac: RRR, no rubs, murmurs or gallops Pulm: Normal respiratory effort, L bronchial breath sounds in L mid lung field, mild crackles in bilateral lung bases.  Abd: soft, nontender, nondistended, BS present Ext: warm and well perfused, no pedal edema Neuro: alert and oriented X3, cranial nerves II-XII grossly intact.   Lab Results: Basic Metabolic Panel:  Lab 02/12/12 1610 02/11/12 0540  NA 137 136  K 3.4* 3.0*  CL 99 98  CO2 29 27  GLUCOSE 123* 123*  BUN 12 16  CREATININE 0.95 1.08  CALCIUM 8.3* 7.7*  MG -- --  PHOS -- 1.5*   Liver Function Tests:  Lab 02/09/12 0609  AST 109*  ALT 28  ALKPHOS 48  BILITOT 1.9*  PROT 6.2  ALBUMIN 2.3*   CBC:  Lab 02/12/12 0545 02/11/12 0540 02/09/12 0431  WBC 21.8* 19.0* --  NEUTROABS -- -- 11.0*  HGB 10.0* 10.4* --  HCT 29.1* 30.1* --  MCV  95.7 96.2 --  PLT 244 179 --   Cardiac Enzymes:  Lab 02/11/12 0540 02/10/12 0505 02/09/12 1000  CKTOTAL 1426* 3601* 4514*  CKMB -- -- --  CKMBINDEX -- -- --  TROPONINI -- -- --   Hemoglobin A1C:  Lab 02/09/12 1000  HGBA1C 5.7*   Thyroid Function Tests:  Lab 02/09/12 1000  TSH 1.137  T4TOTAL --  FREET4 --  T3FREE --  THYROIDAB --   Urinalysis:  Lab 02/09/12 0901  COLORURINE ORANGE*  LABSPEC 1.015  PHURINE 5.5  GLUCOSEU NEGATIVE  HGBUR LARGE*  BILIRUBINUR NEGATIVE  KETONESUR NEGATIVE  PROTEINUR 100*  UROBILINOGEN 1.0  NITRITE NEGATIVE  LEUKOCYTESUR NEGATIVE   Misc. Labs: Urine legionella positive on 02/09/12  Micro Results: Recent Results (from the past 240 hour(s))  CULTURE, BLOOD (ROUTINE X 2)     Status: Normal (Preliminary result)   Collection Time   02/09/12  6:13 AM      Component Value Range Status Comment   Specimen Description BLOOD LEFT ARM   Final    Special Requests BOTTLES DRAWN AEROBIC AND ANAEROBIC   Final    Culture  Setup Time 02/09/2012 11:26   Final  Culture     Final    Value:        BLOOD CULTURE RECEIVED NO GROWTH TO DATE CULTURE WILL BE HELD FOR 5 DAYS BEFORE ISSUING A FINAL NEGATIVE REPORT   Report Status PENDING   Incomplete   CULTURE, BLOOD (ROUTINE X 2)     Status: Normal (Preliminary result)   Collection Time   02/09/12  6:18 AM      Component Value Range Status Comment   Specimen Description BLOOD LEFT HAND   Final    Special Requests BOTTLES DRAWN AEROBIC AND ANAEROBIC   Final    Culture  Setup Time 02/09/2012 11:26   Final    Culture     Final    Value:        BLOOD CULTURE RECEIVED NO GROWTH TO DATE CULTURE WILL BE HELD FOR 5 DAYS BEFORE ISSUING A FINAL NEGATIVE REPORT   Report Status PENDING   Incomplete    Medications:  Scheduled Meds:   . azithromycin  500 mg Oral Daily  . azithromycin  500 mg Oral Once  . enoxaparin (LOVENOX) injection  40 mg Subcutaneous Q24H  . folic acid  1 mg Oral Daily  . magnesium  sulfate 1 - 4 g bolus IVPB  2 g Intravenous Once  . multivitamin with minerals  1 tablet Oral Daily  . pantoprazole  40 mg Oral Daily  . phosphorus  500 mg Oral TID  . potassium chloride SA      . potassium chloride  40 mEq Oral Once  . thiamine  100 mg Oral Daily   Or  . thiamine  100 mg Intravenous Daily   Continuous Infusions:  PRN Meds:.acetaminophen, LORazepam, LORazepam, morphine injection, prochlorperazine Assessment/Plan:  1. Community acquired pneumonia - Legionella:  Mr. Loll is a 51 year old man with a PMH significant for hypertension who present with fever, chills, cough, shortness of breath, and CXR findings and CT findings consistent with pneumonia. On admission he had PSI Class IV, conferring 2.8% in hospital mortality risk. He was started on azithromycin and rocephin IV 3 days ago. On 7/29 urine Ag test returned + for serogroup 1 legionella pneumophilia. Rocephin was discontinued and he was maintained on IV azithromycin. His last fever was 100.4 at 0930 on 7/30, over 48 hrs ago. This morning WBC trending up 12-->15-->19-->21. Overall, however, he says he feels much better. Respiration rate is stable at 20 this morning, he is ambulating without difficulty, and he is satting consistently at 93% or above  on room air. He has been transitioned to azithromycin 500mg  once daily as he has been afebrile >48 hrs. He will need a 10 day course. His phos level was checked as this can be low with legionella infection (and also EtOH abuse). Returned at 1.5. Repleted w 500po TID x1.  - Continue azithromycin. He has received 4/10 days of therapy so far.  - Blood cultures pending, NGTD.  - He remains afebrile >48hrs with improvement of his symptoms and will be discharged to home today.   2. Acute Kidney Injury: Creatinine on admission was 1.98 and BUN was 29. Previous baseline back in May of 2013 was 0.98. UA showed granular casts, large Hb and 0-2 WBC. CK was 4500 on admission. Suspect ATN 2/2  rhabdomyolysis. FeNa is more suggestive of prerenal AKI at 0.11, pt has been off of diuretic for several days and w decreased po intake. Urine Osms not consistent with SIADH.  He was hydrated with  NS at 200 ml/hr for 15 hours then 125 per hour. Renal function has improved w hydration and his Cr this am is 0.95.  - Resolved   3. Rhabdomyolysis  Pt w CK of 4500 and suspected ATN 2/2 rhabdo. CK trended down to 3600-->1400 yesterday w IVF. Etiology unclear - EtOH use vs fall vs immobility. Stopped IV therapy yesterday.  - Improving.  Will need OPC follow up CK, BMET  4. Hypertension:  On admission, he was tachycardic, hyponatremic, hypochloremic, and hypokalemic which likely indicated volume depletion. We held his triamterene-HCTZ while repleting him. Discussed changing his antihypertensive to one that would be less likely to volume deplete him, but he prefers to continue w current therapy. He is normotensive this morning hut has had mild elevated BP throughout his stay. Will resume his home BP medications upon discharge.  --Follow up with [facility] for HTN medication reassessment.    5. Hypokalemia  Pt's K low on admission (2.8) remains low (3.4 today), despite daily repletion. Possibly due to concomitant low Mg, as pt w h/o EtOH abuse. Gave 2mg  IV MgSo4, repleting prn with [facility].  --Discharge with Kdur BID  5. Tobacco and alcohol abuse:  He would likely benefit from counseling concerning his tobacco and binge alcohol use. We will discuss this with him and see if he wants contact with support to help him quit prior to discharge.   6. VTE: Lovenox . Pt ambulating, to be discharged today.   LOS: 3 days   Ky Barban 02/12/2012, 10:45 AM

## 2012-02-12 NOTE — Progress Notes (Signed)
Pt given copy of discharge instructions, wife at bedside, pt and wife verbalized understanding of instructions, and signs and symptoms to call and follow up with Cardiac MD, IV D/C'd

## 2012-02-15 LAB — CULTURE, BLOOD (ROUTINE X 2): Culture: NO GROWTH

## 2012-02-16 ENCOUNTER — Ambulatory Visit (INDEPENDENT_AMBULATORY_CARE_PROVIDER_SITE_OTHER): Payer: Self-pay | Admitting: Internal Medicine

## 2012-02-16 ENCOUNTER — Encounter: Payer: Self-pay | Admitting: Internal Medicine

## 2012-02-16 VITALS — BP 135/84 | HR 87 | Temp 98.2°F | Ht 71.5 in | Wt 202.1 lb

## 2012-02-16 DIAGNOSIS — F1010 Alcohol abuse, uncomplicated: Secondary | ICD-10-CM | POA: Insufficient documentation

## 2012-02-16 DIAGNOSIS — Z720 Tobacco use: Secondary | ICD-10-CM

## 2012-02-16 DIAGNOSIS — F17200 Nicotine dependence, unspecified, uncomplicated: Secondary | ICD-10-CM

## 2012-02-16 DIAGNOSIS — N179 Acute kidney failure, unspecified: Secondary | ICD-10-CM

## 2012-02-16 DIAGNOSIS — M6282 Rhabdomyolysis: Secondary | ICD-10-CM | POA: Insufficient documentation

## 2012-02-16 DIAGNOSIS — I10 Essential (primary) hypertension: Secondary | ICD-10-CM

## 2012-02-16 DIAGNOSIS — J189 Pneumonia, unspecified organism: Secondary | ICD-10-CM

## 2012-02-16 LAB — CBC
HCT: 31 % — ABNORMAL LOW (ref 39.0–52.0)
Hemoglobin: 10.1 g/dL — ABNORMAL LOW (ref 13.0–17.0)
RBC: 3.16 MIL/uL — ABNORMAL LOW (ref 4.22–5.81)
WBC: 14.3 10*3/uL — ABNORMAL HIGH (ref 4.0–10.5)

## 2012-02-16 MED ORDER — NICOTINE 14 MG/24HR TD PT24: 1.0000 | MEDICATED_PATCH | TRANSDERMAL | Status: AC

## 2012-02-16 NOTE — Assessment & Plan Note (Signed)
Patient is determined to stop drinking alcohol abuse now. He states that he has not been drinking since his discharge.  He states that he does not need CSW now. And he can stop his drinking on his own.  - I instructed him to call the clinic to speak to CSW if needed.

## 2012-02-16 NOTE — Progress Notes (Signed)
Subjective:   Patient ID: Justin Nolan male   DOB: Nov 09, 1960 51 y.o.   MRN: [id number]  HPI: [name] is a 51 y.o. man with PMH of HTN, Tobacco/alcohol abuse and recent discharge from hospital for CAP-Legionella PNA, AKI and Rhabdomyolysis who presents to the clinic for hospital follow up.   1. Legionella Pneumonia patient feels ok. Denies cough or shortness. Patient states that he continues to have left chest sharp pain like what he has had during this admission, 8-10/10, lasting minutes to hours. No radiation. Deep breathing and activity can trigger his pain. He denies pain while resting. He states that he has taken his ABX as instructed. He will finish his ABX in am.  Will check his CBC today. Will obtain CXR in 6 weeks( the September 16 th).  Of note, I have reviewed his CR with Dr. Meredith Pel.   2. AKI: ATN 2/2 Rabdomyolysis Baseline  0.98. Cr normalized on hospital day 3. Will obtain BMP  3. Rabdomyolysis  The etiology was unclear>>ETOH vs Fall vs immobility. Denies any muscle pain, weakness.   4. Alcohol abuse History of binge drinking for years. Negative CAGE.  Patient is willing to stop drinking. Patient states that he usually drink beers so that he can fall into sleep at night.  He would like to get medications for insomnia.   5. Hypertension  He has not been taking his Maxzide. His BP is well controlled.  He would like to try low sodium diet and weight loss. He reports that he used to weigh 230 lbs and he is 202 lbs now.   His BMI is 28 now.  Will instruct him to lose another 10 lbs within next 4 months.   - patient is instructed to monitor his BP daily at home.    Past Medical History  Diagnosis Date  . Hypertension   . Pneumonia 02/09/2012  . GERD (gastroesophageal reflux disease)    Current Outpatient Prescriptions  Medication Sig Dispense Refill  . azithromycin (ZITHROMAX) 500 MG tablet Take 1 tablet (500 mg total) by mouth daily.  6 tablet  0  .  esomeprazole (NEXIUM) 40 MG capsule Take 40 mg by mouth daily as needed. For acid reflux      . Multiple Vitamin (MULTIVITAMIN WITH MINERALS) TABS Take 1 tablet by mouth daily.      Marland Kitchen triamterene-hydrochlorothiazide (MAXZIDE-25) 37.5-25 MG per tablet Take 1 each (1 tablet total) by mouth daily.  30 tablet  0   Family History  Problem Relation Age of Onset  . Diabetes Maternal Aunt   . Diabetes Paternal Aunt    History   Social History  . Marital Status: Widowed    Spouse Name: N/A    Number of Children: N/A  . Years of Education: N/A   Social History Main Topics  . Smoking status: Current Everyday Smoker -- 0.5 packs/day for 25 years    Types: Cigarettes  . Smokeless tobacco: Never Used  . Alcohol Use: 1.8 oz/week    3 Shots of liquor per week     history of weekend binge drinking  . Drug Use: No  . Sexually Active: Yes   Other Topics Concern  . 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     Review of Systems: Review of Systems:  Constitutional:  Denies fever, chills, diaphoresis, appetite change and fatigue.   HEENT:  Denies congestion, sore throat, rhinorrhea, sneezing,  mouth sores, trouble swallowing, neck pain   Respiratory:  Denies SOB, DOE, cough, and wheezing. Pleuritic chest pain  Cardiovascular:  Denies palpitations and leg swelling.   Gastrointestinal:  Denies nausea, vomiting, abdominal pain, diarrhea, constipation, blood in stool and abdominal distention.   Genitourinary:  Denies dysuria, urgency, frequency, hematuria, flank pain and difficulty urinating.   Musculoskeletal:  Denies myalgias, back pain, joint swelling, arthralgias and gait problem.   Skin:  Denies pallor, rash and wound.   Neurological:  Denies dizziness, seizures, syncope, weakness, light-headedness, numbness and headaches.    .   Objective:  Physical Exam: Filed Vitals:   02/16/12 1350  BP: 135/84  Pulse: 87  Temp: 98.2 F (36.8 C)    TempSrc: Oral  Height: 5' 11.5" (1.816 m)  Weight: 202 lb 1.6 oz (91.672 kg)   General: alert, well-developed, and cooperative to examination.  Head: normocephalic and atraumatic.  Eyes: vision grossly intact, pupils equal, pupils round, pupils reactive to light, no injection and anicteric.  Mouth: pharynx pink and moist, no erythema, and no exudates.  Neck: supple, full ROM, no thyromegaly, no JVD, and no carotid bruits.  Lungs: normal respiratory effort, no accessory muscle use, Left posterior 1/2-2/3 lung rales noted. No wheezing or rhonchi. Right lung coarse breath sound. No rales, wheezing or rhonchi noted.  Heart: normal rate, regular rhythm, no murmur, no gallop, and no rub.  Abdomen: soft, non-tender, normal bowel sounds, no distention, no guarding, no rebound tenderness, no hepatomegaly, and no splenomegaly.  Msk: no joint swelling, no joint warmth, and no redness over joints.  Pulses: 2+ DP/PT pulses bilaterally Extremities: No cyanosis, clubbing, edema Neurologic: alert & oriented X3, cranial nerves II-XII intact, strength normal in all extremities, sensation intact to light touch, and gait normal.  Skin: turgor normal and no rashes.  Psych: Oriented X3, memory intact for recent and remote, normally interactive, good eye contact, not anxious appearing, and not depressed appearing.   Assessment & Plan:

## 2012-02-16 NOTE — Assessment & Plan Note (Signed)
Long time smoker.  - patient would like to stop smoking - nicotine patch

## 2012-02-16 NOTE — Patient Instructions (Addendum)
1. Follow up in 2 months 2. finish your antibiotics.  3. Will give you one additional week off 4. Come back to the clinic with worsening s/s( cough, fever, SOB)

## 2012-02-16 NOTE — Assessment & Plan Note (Addendum)
See HPI  Will check BMP today

## 2012-02-16 NOTE — Assessment & Plan Note (Addendum)
He is here for hospital follow up after treated for PNA. He feels ok. Denies cough, SOB, fever or chills. He states that he wants to go back to work. Left 1/2-2/3 lung rales noted which is correlated to his CXR appearance during the admission.  - He will finish his ABX tomorrow (10 days).  - Per uptodate," When azithromycin is used, we favor an initial dose of 1 gram on day one, followed by 500 mg daily, with a total duration of therapy of 7 to 10 days". " For immunosuppressed patients who are severely ill upon presentation, a 21 day course of levofloxacin (750 mg once daily) is often recommended." - I have talked to ID DR. [facility] given his significant rales on exam and CXR appearence on last admission, we will go by uptodate recommendation and will not extend his ABX therapy since there is no indication that he is immunocompromised.  - I will give him additional one week off from his work - patient is instructed to come back in 6 weeks ( the week of Sep 16th) for repeat CXR>>the order entered. - patient is instructed to come back to the clinic if he has worsening of symptoms.  - will obtain CXR  In 6 weeks (order entered)

## 2012-02-16 NOTE — Assessment & Plan Note (Signed)
Diet and weight loss Monitor BP daily. Will hold his antihypertensive medications for now since BP is well controlled with diet and weight loss.

## 2012-02-17 LAB — BASIC METABOLIC PANEL WITH GFR
BUN: 14 mg/dL (ref 6–23)
Calcium: 8.8 mg/dL (ref 8.4–10.5)
Chloride: 105 mEq/L (ref 96–112)
GFR, Est Non African American: >89 mL/min
Potassium: 4.8 mEq/L (ref 3.5–5.3)

## 2012-02-17 NOTE — Progress Notes (Signed)
PSO2 room air 96% - Dr Dierdre Searles aware. Stanton Kidney Conley Pawling RN 02/16/12 2PM

## 2012-05-10 ENCOUNTER — Encounter: Payer: Self-pay | Admitting: Internal Medicine

## 2012-05-10 ENCOUNTER — Ambulatory Visit (INDEPENDENT_AMBULATORY_CARE_PROVIDER_SITE_OTHER): Payer: Self-pay | Admitting: Internal Medicine

## 2012-05-10 VITALS — BP 153/106 | HR 73 | Temp 98.2°F | Ht 65.0 in | Wt 205.8 lb

## 2012-05-10 DIAGNOSIS — F17200 Nicotine dependence, unspecified, uncomplicated: Secondary | ICD-10-CM

## 2012-05-10 DIAGNOSIS — I10 Essential (primary) hypertension: Secondary | ICD-10-CM

## 2012-05-10 DIAGNOSIS — Z720 Tobacco use: Secondary | ICD-10-CM

## 2012-05-10 DIAGNOSIS — J189 Pneumonia, unspecified organism: Secondary | ICD-10-CM

## 2012-05-10 DIAGNOSIS — F1010 Alcohol abuse, uncomplicated: Secondary | ICD-10-CM

## 2012-05-10 MED ORDER — TRIAMTERENE-HCTZ 37.5-25 MG PO TABS: 1.0000 | ORAL_TABLET | Freq: Every day | ORAL | Status: AC

## 2012-05-10 NOTE — Assessment & Plan Note (Signed)
The patient's BP is elevated today.  He has been on Maxzide in the past, but for the last 2 months has only been managing his BP with lifestyle modification.  BP today is 153/106.  Patient has had hypokalemia in the past. -start maxzide 37.5-25 mg/day

## 2012-05-10 NOTE — Progress Notes (Signed)
HPI The patient is a 51 y.o. yo male with a history of HTN, hypokalemia, presenting for a routine follow-up visit.  The patient was hospitalized for CAP 01/2012.  At his HFU visit 02/2012, he was still completing his antibiotic course.  Since then, he has completed his antibiotics, and feels that his symptoms have fully resolved.  The patient notes no fever/cough.  He does note some sinus congestion, which is a chronic problem for him, treated with OTC antihistamines.  The patient is noted to have a history of alcohol abuse.  Since his last visit, he has been trying to "cut back" on his drinking.  For the last 2 weeks, the patient reports no alcohol consumption.  The patient also has a history of tobacco abuse.  The patient has been smoking 1 ppd x31 years, now cut back to about 1/2-1/3 ppd.  The patient has never tried to quit in the past.  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  He is currently implementing strategies to avoid these triggers, such as placing his ashtray outside his house (so he has to walk outside to smoke), and only bringing a set number of cigarettes with him in his dump truck rather than a whole pack.  We discuss tobacco replacement products, meeting with CSW, and bupropion.  ROS: General: no fevers, chills, changes in weight, changes in appetite Skin: no rash HEENT: no blurry vision, hearing changes, sore throat Pulm: no dyspnea, coughing, wheezing CV: no chest pain, palpitations, shortness of breath Abd: no abdominal pain, nausea/vomiting, diarrhea/constipation GU: no dysuria, hematuria, polyuria Ext: no arthralgias, myalgias Neuro: no weakness, numbness, or tingling  Filed Vitals:   05/10/12 0907  BP: 153/106  Pulse: 73  Temp: 98.2 F (36.8 C)    PEX General: alert, cooperative, and in no apparent distress HEENT: pupils equal round and reactive to light, vision grossly intact, oropharynx clear and non-erythematous    Neck: supple, no lymphadenopathy, JVD, or carotid bruits Lungs: clear to ascultation bilaterally, normal work of respiration, no wheezes, rales, ronchi Heart: regular rate and rhythm, no murmurs, gallops, or rubs Abdomen: soft, non-tender, non-distended, normal bowel sounds Extremities: no cyanosis, clubbing, or edema Neurologic: alert & oriented X3, cranial nerves II-XII intact, strength grossly intact, sensation intact to light touch  Current Outpatient Prescriptions on File Prior to Visit  Medication Sig Dispense Refill  . esomeprazole (NEXIUM) 40 MG capsule Take 40 mg by mouth daily as needed. For acid reflux      . Multiple Vitamin (MULTIVITAMIN WITH MINERALS) TABS Take 1 tablet by mouth daily.        Assessment/Plan

## 2012-05-10 NOTE — Assessment & Plan Note (Signed)
Patient appears to be cutting back on his own.  Gave patient info on AA, but my impression is that the patient believes he is doing fine on his own for now.

## 2012-05-10 NOTE — Patient Instructions (Signed)
Your blood pressure is elevated today.  Elevated blood pressure over long periods of time can increase your risk of having a stroke or heart attack. -we are starting Maxzide today.  Take 1 tablet daily to treat blood pressure.  We discussed smoking cessation today.  We can talk about this issue again at your next visit.  Please return for a follow-up visit in 1-2 months for a blood pressure re-check.

## 2012-05-10 NOTE — Assessment & Plan Note (Addendum)
We discussed tobacco cessation in detail, including nicotine replacement products, bupropion, and CSW meetings.  After our discussion, patient decided he did not want to start anything at this time due to financial concerns, but is open to re-addressing at his next visit. -will readdress at next visit. -patient given handouts on 1-800-quit-now

## 2012-05-10 NOTE — Assessment & Plan Note (Signed)
Appears to have resolved.  Patient did not return for f/u x-ray.  Patient currently trying to obtain orange card. -patient given info on orange card -PNA appears to have resolved.  Can readdress issue of whether patient needs f/u CXR at next visit.  Currently would be prohibitively expensive without orange card

## 2012-08-31 ENCOUNTER — Ambulatory Visit: Payer: Self-pay | Admitting: Family Medicine

## 2012-08-31 VITALS — BP 156/86 | HR 80 | Temp 98.2°F | Resp 16 | Ht 73.0 in | Wt 209.0 lb

## 2012-08-31 DIAGNOSIS — Z0289 Encounter for other administrative examinations: Secondary | ICD-10-CM

## 2012-08-31 DIAGNOSIS — I10 Essential (primary) hypertension: Secondary | ICD-10-CM

## 2012-08-31 NOTE — Progress Notes (Signed)
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 He has not been taking his blood pressure medication regularly, though he took at this morning. He realizes his need to take care of himself which is not been doing. He physically feels well.  Review of systems: Unremarkable  Past history: He's had surgery on his left knee for patella tendon rupture. No other major operations.  Medications: Triamterene hydrochlorothiazide daily  Drug allergies: None  Social history: He is married, has been a [employer] architect. Drinks a little beer. He does smoke a pack cigarettes a day. No other substances.  Physical examination Pleasant alert gentleman in no major distress. TMs normal. Eyes PERRLA. Fundi benign. Throat clear. Neck supple without nodes thyromegaly. No carotid bruits. Chest is clear to auscultation. Heart regular without murmurs gallops or arrhythmias. Abdomen soft without mass or tenderness. Normal male external genitalia. Testes are normal. No hernias are detected. Spine is intact with good range of motion. Pedal pulses are good. Skin unremarkable. Does have the knee scar.  Assessment: DOT physical examination Hypertension unsatisfactory control  Repeat blood pressure readings came down to 156/86. He still needs to work on this. He is to check his pressure faithfully and it is not coming on down in the next several weeks he should come back to have his medication adjusted. If with sore striction and faithful taking his medication and regular exercise he gets a better reading, then he should return in 6 weeks or 2 months to get reexamined for an update on his card. Three-month expiration date on him.

## 2012-08-31 NOTE — Patient Instructions (Signed)
Blood pressure must be controlled within 3 months to get a renewal of your card.

## 2012-09-02 ENCOUNTER — Encounter: Payer: Self-pay | Admitting: Family Medicine

## 2012-10-19 ENCOUNTER — Ambulatory Visit: Payer: Self-pay | Admitting: Family Medicine

## 2012-10-19 VITALS — BP 136/86 | HR 78

## 2012-10-19 DIAGNOSIS — Z0289 Encounter for other administrative examinations: Secondary | ICD-10-CM

## 2012-10-19 DIAGNOSIS — I10 Essential (primary) hypertension: Secondary | ICD-10-CM

## 2012-10-19 DIAGNOSIS — Z024 Encounter for examination for driving license: Secondary | ICD-10-CM

## 2012-10-19 NOTE — Progress Notes (Signed)
Subjective: Patient is here for a redo of his blood pressure checked for DOT. He passed a physical examination 2 months ago except for the blood pressure was a little high. He is here for his recheck.   Objective: 136/86  Assessment: Blood pressure satisfactory control  Plan: Do DD card until August 21 2013.  He has them on his right side he was taken off some, told to come back in my behavior do that

## 2012-10-19 NOTE — Patient Instructions (Signed)
Continue with regular exercise, try to get your weight down to less than 190 by eating right and exercising, avoid excessive salt, continue getting off the cigarettes completely. Monitor your blood pressure in a drug store. If it is running high at 8 point, back for further assessment. Otherwise return next February when your card is due for DOT

## 2013-12-13 ENCOUNTER — Ambulatory Visit (INDEPENDENT_AMBULATORY_CARE_PROVIDER_SITE_OTHER): Payer: Self-pay | Admitting: Internal Medicine

## 2013-12-13 VITALS — BP 162/110 | HR 86 | Temp 97.9°F | Resp 18 | Ht 72.0 in | Wt 206.8 lb

## 2013-12-13 DIAGNOSIS — Z0289 Encounter for other administrative examinations: Secondary | ICD-10-CM

## 2013-12-13 DIAGNOSIS — I10 Essential (primary) hypertension: Secondary | ICD-10-CM

## 2013-12-13 NOTE — Progress Notes (Signed)
   Subjective:    Patient ID: Justin Nolan, male    DOB: [birth year], 53 y.o.   MRN: [id number]  HPI 53 y.o male presents for DOT physical. Reports that he has history of hypertension for which he is treated for. Denies any headaches, dizziness.    Review of Systems     Objective:   Physical Exam        Assessment & Plan:

## 2013-12-14 ENCOUNTER — Ambulatory Visit: Payer: Self-pay | Admitting: Family Medicine

## 2013-12-14 VITALS — BP 130/84 | HR 86 | Temp 98.2°F | Resp 16 | Ht 72.0 in | Wt 206.8 lb

## 2013-12-14 DIAGNOSIS — Z0289 Encounter for other administrative examinations: Secondary | ICD-10-CM

## 2013-12-14 NOTE — Progress Notes (Signed)
Physical examination: History: Patient is here for his DOT examination. He has no acute complaints. He is happy to be getting a job.  Past medical history: Medical illnesses: History of hypertension, controlled GERD History of tobacco use No major operations except for repair of left Patellar tendon  Social history: Married, stressful, drives a truck  Is review of systems: Unremarkable  Physical exam: Well-developed well-nourished man in no acute distress. TMs normal. Eyes PERRLA. Fundi benign. Throat clear. Neck supple without nodes thyromegaly. No carotid bruits. Chest clear. Heart without murmurs. Skin tag right chest wall. And soft without mass or tenderness. Normal male external genitalia testes descended. No hernias. Extremities unremarkable. Skin unremarkable.  Assessment: DOT physical examination Fibrocutaneous skin tag Tobacco abuse  Plan: Advised quitting smoking. Return for skin tag removal. Also advise some general labs at that time.

## 2013-12-14 NOTE — Patient Instructions (Signed)
Return for skin tag removal.

## 2014-11-29 ENCOUNTER — Ambulatory Visit (INDEPENDENT_AMBULATORY_CARE_PROVIDER_SITE_OTHER): Payer: No Typology Code available for payment source | Admitting: Family Medicine

## 2014-11-29 VITALS — BP 172/98 | HR 79 | Temp 98.1°F | Resp 18 | Wt 204.0 lb

## 2014-11-29 DIAGNOSIS — I10 Essential (primary) hypertension: Secondary | ICD-10-CM | POA: Diagnosis not present

## 2014-11-29 DIAGNOSIS — Z13220 Encounter for screening for lipoid disorders: Secondary | ICD-10-CM

## 2014-11-29 DIAGNOSIS — Z1211 Encounter for screening for malignant neoplasm of colon: Secondary | ICD-10-CM | POA: Diagnosis not present

## 2014-11-29 DIAGNOSIS — R3919 Other difficulties with micturition: Secondary | ICD-10-CM

## 2014-11-29 DIAGNOSIS — Z720 Tobacco use: Secondary | ICD-10-CM

## 2014-11-29 DIAGNOSIS — Z130 Encounter for screening for diseases of the blood and blood-forming organs and certain disorders involving the immune mechanism: Secondary | ICD-10-CM | POA: Diagnosis not present

## 2014-11-29 DIAGNOSIS — K219 Gastro-esophageal reflux disease without esophagitis: Secondary | ICD-10-CM

## 2014-11-29 DIAGNOSIS — R39198 Other difficulties with micturition: Secondary | ICD-10-CM

## 2014-11-29 LAB — POCT URINALYSIS DIPSTICK
Blood, UA: NEGATIVE
Glucose, UA: NEGATIVE
LEUKOCYTES UA: NEGATIVE
NITRITE UA: NEGATIVE
PH UA: 6
PROTEIN UA: 100
Spec Grav, UA: 1.025
Urobilinogen, UA: 0.2

## 2014-11-29 LAB — COMPREHENSIVE METABOLIC PANEL
ALT: 21 U/L (ref 0–53)
AST: 19 U/L (ref 0–37)
Albumin: 4.6 g/dL (ref 3.5–5.2)
Alkaline Phosphatase: 57 U/L (ref 39–117)
BILIRUBIN TOTAL: 1.4 mg/dL — AB (ref 0.2–1.2)
BUN: 11 mg/dL (ref 6–23)
CALCIUM: 9.6 mg/dL (ref 8.4–10.5)
CO2: 29 meq/L (ref 19–32)
CREATININE: 1.01 mg/dL (ref 0.50–1.35)
Chloride: 103 mEq/L (ref 96–112)
Glucose, Bld: 103 mg/dL — ABNORMAL HIGH (ref 70–99)
Potassium: 4 mEq/L (ref 3.5–5.3)
Sodium: 142 mEq/L (ref 135–145)
Total Protein: 7.4 g/dL (ref 6.0–8.3)

## 2014-11-29 LAB — POCT UA - MICROSCOPIC ONLY
CRYSTALS, UR, HPF, POC: NEGATIVE
Casts, Ur, LPF, POC: NEGATIVE
EPITHELIAL CELLS, URINE PER MICROSCOPY: NEGATIVE
MUCUS UA: POSITIVE
Yeast, UA: NEGATIVE

## 2014-11-29 LAB — CBC
HCT: 41.7 % (ref 39.0–52.0)
Hemoglobin: 14.1 g/dL (ref 13.0–17.0)
MCH: 31.8 pg (ref 26.0–34.0)
MCHC: 33.8 g/dL (ref 30.0–36.0)
MCV: 93.9 fL (ref 78.0–100.0)
MPV: 11.2 fL (ref 8.6–12.4)
PLATELETS: 194 10*3/uL (ref 150–400)
RBC: 4.44 MIL/uL (ref 4.22–5.81)
RDW: 12.7 % (ref 11.5–15.5)
WBC: 6.3 10*3/uL (ref 4.0–10.5)

## 2014-11-29 LAB — LIPID PANEL
CHOL/HDL RATIO: 4.1 ratio
Cholesterol: 165 mg/dL (ref 0–200)
HDL: 40 mg/dL (ref >=40)
LDL CALC: 57 mg/dL (ref 0–99)
Triglycerides: 342 mg/dL — ABNORMAL HIGH (ref <150)
VLDL: 68 mg/dL — AB (ref 0–40)

## 2014-11-29 MED ORDER — LISINOPRIL 10 MG PO TABS: 10.0000 mg | ORAL_TABLET | Freq: Every day | ORAL | Status: DC

## 2014-11-29 MED ORDER — SUCRALFATE 1 G PO TABS: 1.0000 g | ORAL_TABLET | Freq: Three times a day (TID) | ORAL | Status: DC

## 2014-11-29 MED ORDER — PANTOPRAZOLE SODIUM 40 MG PO TBEC: 40.0000 mg | DELAYED_RELEASE_TABLET | Freq: Every day | ORAL | Status: DC

## 2014-11-29 MED ORDER — TAMSULOSIN HCL 0.4 MG PO CAPS: 0.4000 mg | ORAL_CAPSULE | Freq: Every day | ORAL | Status: DC

## 2014-11-29 NOTE — Progress Notes (Signed)
[facility] [address], [place] [id number] [phone]  Date:  11/29/2014   Name:  Justin Nolan   DOB:  [birth year]   MRN:  [id number]  PCP:  No PCP Per Patient    Chief Complaint: Gastrophageal Reflux and Dysuria   History of Present Illness:  Justin Nolan is a 54 y.o. very pleasant male patient who presents with the following:  Here today to recheck a couple of concerns.  He notes acid reflux, and a bad knee for which he is seeing murphy wainer.  He uses occasional pain medicaiton He has been taking OTC nexium- however this does not seem to be helping him more recently so she switched to tums and/ or maalox/ [place].   He did try a protonix from a friend that helped him He has a 12- 14 year history of reflux but never had an ulcer as far as he knows.  He will have more sx with the smell of food, carbonated drinks, acidic foods.  He will have sx when he lays down at night as well.    He notes a poor urine stream, and sometimes some hesitancy. This has been going on for about 2 months or more.  He has to get up at night just once.  No pain with urination.    He is supposed to be on maxzide but he ran out a couple of months ago.   He is fasting this am.    Patient Active Problem List   Diagnosis Date Noted  . Alcohol abuse 02/16/2012  . HTN (hypertension) 02/09/2012  . GERD (gastroesophageal reflux disease) 02/09/2012  . Tobacco use 02/09/2012  . Indirect hyperbilirubinemia 02/09/2012    Past Medical History  Diagnosis Date  . Hypertension   . Pneumonia 02/09/2012  . GERD (gastroesophageal reflux disease)     Past Surgical History  Procedure Laterality Date  . Knee surgery      History  Substance Use Topics  . Smoking status: Current Every Day Smoker -- 0.50 packs/day for 25 years    Types: Cigarettes  . Smokeless tobacco: Never Used  . Alcohol Use: 1.8 oz/week    3 Shots of liquor per week     Comment: history of weekend binge drinking     Family History  Problem Relation Age of Onset  . Diabetes Maternal Aunt   . Diabetes Paternal Aunt     No Known Allergies  Medication list has been reviewed and updated.  Current Outpatient Prescriptions on File Prior to Visit  Medication Sig Dispense Refill  . Multiple Vitamin (MULTIVITAMIN WITH MINERALS) TABS Take 1 tablet by mouth daily.    Marland Kitchen. esomeprazole (NEXIUM) 40 MG capsule Take 40 mg by mouth daily as needed. For acid reflux     No current facility-administered medications on file prior to visit.    Review of Systems:  As per HPI- otherwise negative.   Physical Examination: Filed Vitals:   11/29/14 0819  BP: 172/98  Pulse: 79  Temp: 98.1 F (36.7 C)  Resp: 18   Filed Vitals:   11/29/14 0819  Weight: 204 lb (92.534 kg)   Body mass index is 27.66 kg/(m^2). Ideal Body Weight:    GEN: WDWN, NAD, Non-toxic, A & O x 3, looks well HEENT: Atraumatic, Normocephalic. Neck supple. No masses, No LAD.  Bilateral TM wnl, oropharynx normal.  PEERL,EOMI.   Ears and Nose: No external deformity. CV: RRR, No M/G/R. No JVD. No  thrill. No extra heart sounds. PULM: CTA B, no wheezes, crackles, rhonchi. No retractions. No resp. distress. No accessory muscle use. ABD: S, ND, +BS. No rebound. No HSM.  Benign belly except for minimal epigastric TTP EXTR: No c/c/e NEURO Normal gait.  PSYCH: Normally interactive. Conversant. Not depressed or anxious appearing.  Calm demeanor.  GU: normal penis and testes, prostate is smooth and feels normal for age   Results for orders placed or performed in visit on 11/29/14  POCT UA - Microscopic Only  Result Value Ref Range   WBC, Ur, HPF, POC 0-2    RBC, urine, microscopic 0-2    Bacteria, U Microscopic trace    Mucus, UA positive    Epithelial cells, urine per micros neg    Crystals, Ur, HPF, POC neg    Casts, Ur, LPF, POC neg    Yeast, UA neg   POCT urinalysis dipstick  Result Value Ref Range   Color, UA amber    Clarity, UA  clear    Glucose, UA neg    Bilirubin, UA small    Ketones, UA trace    Spec Grav, UA 1.025    Blood, UA neg    pH, UA 6.0    Protein, UA 100    Urobilinogen, UA 0.2    Nitrite, UA neg    Leukocytes, UA Negative      Assessment and Plan: Gastroesophageal reflux disease, esophagitis presence not specified - Plan: pantoprazole (PROTONIX) 40 MG tablet, sucralfate (CARAFATE) 1 G tablet, Ambulatory referral to Gastroenterology  Screening for colon cancer - Plan: Ambulatory referral to Gastroenterology  Decreased urine stream - Plan: POCT UA - Microscopic Only, POCT urinalysis dipstick, Urine culture, PSA, tamsulosin (FLOMAX) 0.4 MG CAPS capsule  Essential hypertension - Plan: Comprehensive metabolic panel, lisinopril (PRINIVIL,ZESTRIL) 10 MG tablet  Screening for deficiency anemia - Plan: CBC  Screening for hyperlipidemia - Plan: Lipid panel  Tobacco abuse  Start lisinopril for his HTN Await further labs Add protonix (stop zantac) and carafate for reflux See patient instructions for more details.   Asked to follow-up in about 3 months  BP Readings from Last 3 Encounters:  11/29/14 172/98  12/14/13 130/84  12/13/13 162/110     Signed Abbe AmsterdamJessica Tayona Sarnowski, MD

## 2014-11-29 NOTE — Patient Instructions (Signed)
We need to have you see a gastroenterology doctor- you need a colonoscopy and may also need an "upper GI" to check your esophagyus.   Years of reflux, especially along with alcohol and tobacco can increase your cancer risk Use the protonix daily, and the carafate with meals and at bedtime for 10 days  For your enlarged prostate we are going to try flomax- you take this once a day. I hope that this will allow you to better empty your bladder when you urinate Start on lisinopril for your blood pressure I will be in touch with your labs asap

## 2014-11-30 ENCOUNTER — Encounter: Payer: Self-pay | Admitting: Family Medicine

## 2014-11-30 LAB — URINE CULTURE
COLONY COUNT: NO GROWTH
ORGANISM ID, BACTERIA: NO GROWTH

## 2014-11-30 LAB — PSA: PSA: 3.89 ng/mL (ref <=4.00)

## 2015-04-16 ENCOUNTER — Other Ambulatory Visit: Payer: Self-pay | Admitting: Family Medicine

## 2015-07-19 ENCOUNTER — Ambulatory Visit (INDEPENDENT_AMBULATORY_CARE_PROVIDER_SITE_OTHER): Payer: Self-pay | Admitting: Urgent Care

## 2015-07-19 ENCOUNTER — Ambulatory Visit (INDEPENDENT_AMBULATORY_CARE_PROVIDER_SITE_OTHER): Payer: Self-pay

## 2015-07-19 VITALS — BP 144/94 | HR 80 | Temp 97.7°F | Resp 16 | Ht 72.0 in | Wt 220.6 lb

## 2015-07-19 DIAGNOSIS — R059 Cough, unspecified: Secondary | ICD-10-CM

## 2015-07-19 DIAGNOSIS — N400 Enlarged prostate without lower urinary tract symptoms: Secondary | ICD-10-CM

## 2015-07-19 DIAGNOSIS — R0989 Other specified symptoms and signs involving the circulatory and respiratory systems: Secondary | ICD-10-CM

## 2015-07-19 DIAGNOSIS — R05 Cough: Secondary | ICD-10-CM

## 2015-07-19 DIAGNOSIS — R0981 Nasal congestion: Secondary | ICD-10-CM

## 2015-07-19 DIAGNOSIS — K219 Gastro-esophageal reflux disease without esophagitis: Secondary | ICD-10-CM

## 2015-07-19 DIAGNOSIS — F17200 Nicotine dependence, unspecified, uncomplicated: Secondary | ICD-10-CM

## 2015-07-19 DIAGNOSIS — J209 Acute bronchitis, unspecified: Secondary | ICD-10-CM

## 2015-07-19 DIAGNOSIS — H938X1 Other specified disorders of right ear: Secondary | ICD-10-CM

## 2015-07-19 MED ORDER — AZITHROMYCIN 250 MG PO TABS: ORAL_TABLET | ORAL | Status: DC

## 2015-07-19 MED ORDER — PANTOPRAZOLE SODIUM 40 MG PO TBEC: DELAYED_RELEASE_TABLET | ORAL | Status: AC

## 2015-07-19 MED ORDER — BENZONATATE 100 MG PO CAPS: 100.0000 mg | ORAL_CAPSULE | Freq: Three times a day (TID) | ORAL | Status: DC | PRN

## 2015-07-19 MED ORDER — HYDROCODONE-HOMATROPINE 5-1.5 MG/5ML PO SYRP: 5.0000 mL | ORAL_SOLUTION | Freq: Every evening | ORAL | Status: DC | PRN

## 2015-07-19 MED ORDER — TAMSULOSIN HCL 0.4 MG PO CAPS
ORAL_CAPSULE | ORAL | Status: DC
Start: 2015-07-19 — End: 2016-02-21

## 2015-07-19 NOTE — Patient Instructions (Addendum)
Acute Bronchitis Bronchitis is inflammation of the airways that extend from the windpipe into the lungs (bronchi). The inflammation often causes mucus to develop. This leads to a cough, which is the most common symptom of bronchitis.  In acute bronchitis, the condition usually develops suddenly and goes away over time, usually in a couple weeks. Smoking, allergies, and asthma can make bronchitis worse. Repeated episodes of bronchitis may cause further lung problems.  CAUSES Acute bronchitis is most often caused by the same virus that causes a cold. The virus can spread from person to person (contagious) through coughing, sneezing, and touching contaminated objects. SIGNS AND SYMPTOMS   Cough.   Fever.   Coughing up mucus.   Body aches.   Chest congestion.   Chills.   Shortness of breath.   Sore throat.  DIAGNOSIS  Acute bronchitis is usually diagnosed through a physical exam. Your health care provider will also ask you questions about your medical history. Tests, such as chest X-rays, are sometimes done to rule out other conditions.  TREATMENT  Acute bronchitis usually goes away in a couple weeks. Oftentimes, no medical treatment is necessary. Medicines are sometimes given for relief of fever or cough. Antibiotic medicines are usually not needed but may be prescribed in certain situations. In some cases, an inhaler may be recommended to help reduce shortness of breath and control the cough. A cool mist vaporizer may also be used to help thin bronchial secretions and make it easier to clear the chest.  HOME CARE INSTRUCTIONS  Get plenty of rest.   Drink enough fluids to keep your urine clear or pale yellow (unless you have a medical condition that requires fluid restriction). Increasing fluids may help thin your respiratory secretions (sputum) and reduce chest congestion, and it will prevent dehydration.   Take medicines only as directed by your health care provider.  If  you were prescribed an antibiotic medicine, finish it all even if you start to feel better.  Avoid smoking and secondhand smoke. Exposure to cigarette smoke or irritating chemicals will make bronchitis worse. If you are a smoker, consider using nicotine gum or skin patches to help control withdrawal symptoms. Quitting smoking will help your lungs heal faster.   Reduce the chances of another bout of acute bronchitis by washing your hands frequently, avoiding people with cold symptoms, and trying not to touch your hands to your mouth, nose, or eyes.   Keep all follow-up visits as directed by your health care provider.  SEEK MEDICAL CARE IF: Your symptoms do not improve after 1 week of treatment.  SEEK IMMEDIATE MEDICAL CARE IF:  You develop an increased fever or chills.   You have chest pain.   You have severe shortness of breath.  You have bloody sputum.   You develop dehydration.  You faint or repeatedly feel like you are going to pass out.  You develop repeated vomiting.  You develop a severe headache. MAKE SURE YOU:   Understand these instructions.  Will watch your condition.  Will get help right away if you are not doing well or get worse.   This information is not intended to replace advice given to you by your health care provider. Make sure you discuss any questions you have with your health care provider.   Document Released: 08/07/2004 Document Revised: 07/21/2014 Document Reviewed: 12/21/2012 Elsevier Interactive Patient Education 2016 [employer].    Smoking Cessation, Tips for Success If you are ready to quit smoking, congratulations! You have chosen  to help yourself be healthier. Cigarettes bring nicotine, tar, carbon monoxide, and other irritants into your body. Your lungs, heart, and blood vessels will be able to work better without these poisons. There are many different ways to quit smoking. Nicotine gum, nicotine patches, a nicotine inhaler, or  nicotine nasal spray can help with physical craving. Hypnosis, support groups, and medicines help break the habit of smoking. WHAT THINGS CAN I DO TO MAKE QUITTING EASIER?  Here are some tips to help you quit for good:  Pick a date when you will quit smoking completely. Tell all of your friends and family about your plan to quit on that date.  Do not try to slowly cut down on the number of cigarettes you are smoking. Pick a quit date and quit smoking completely starting on that day.  Throw away all cigarettes.   Clean and remove all ashtrays from your home, work, and car.  On a card, write down your reasons for quitting. Carry the card with you and read it when you get the urge to smoke.  Cleanse your body of nicotine. Drink enough water and fluids to keep your urine clear or pale yellow. Do this after quitting to flush the nicotine from your body.  Learn to predict your moods. Do not let a bad situation be your excuse to have a cigarette. Some situations in your life might tempt you into wanting a cigarette.  Never have "just one" cigarette. It leads to wanting another and another. Remind yourself of your decision to quit.  Change habits associated with smoking. If you smoked while driving or when feeling stressed, try other activities to replace smoking. Stand up when drinking your coffee. Brush your teeth after eating. Sit in a different chair when you read the paper. Avoid alcohol while trying to quit, and try to drink fewer caffeinated beverages. Alcohol and caffeine may urge you to smoke.  Avoid foods and drinks that can trigger a desire to smoke, such as sugary or spicy foods and alcohol.  Ask people who smoke not to smoke around you.  Have something planned to do right after eating or having a cup of coffee. For example, plan to take a walk or exercise.  Try a relaxation exercise to calm you down and decrease your stress. Remember, you may be tense and nervous for the first 2  weeks after you quit, but this will pass.  Find new activities to keep your hands busy. Play with a pen, coin, or rubber band. Doodle or draw things on paper.  Brush your teeth right after eating. This will help cut down on the craving for the taste of tobacco after meals. You can also try mouthwash.   Use oral substitutes in place of cigarettes. Try using lemon drops, carrots, cinnamon sticks, or chewing gum. Keep them handy so they are available when you have the urge to smoke.  When you have the urge to smoke, try deep breathing.  Designate your home as a nonsmoking area.  If you are a heavy smoker, ask your health care provider about a prescription for nicotine chewing gum. It can ease your withdrawal from nicotine.  Reward yourself. Set aside the cigarette money you save and buy yourself something nice.  Look for support from others. Join a support group or smoking cessation program. Ask someone at home or at work to help you with your plan to quit smoking.  Always ask yourself, "Do I need this cigarette or is this  just a reflex?" Tell yourself, "Today, I choose not to smoke," or "I do not want to smoke." You are reminding yourself of your decision to quit.  Do not replace cigarette smoking with electronic cigarettes (commonly called e-cigarettes). The safety of e-cigarettes is unknown, and some may contain harmful chemicals.  If you relapse, do not give up! Plan ahead and think about what you will do the next time you get the urge to smoke. HOW WILL I FEEL WHEN I QUIT SMOKING? You may have symptoms of withdrawal because your body is used to nicotine (the addictive substance in cigarettes). You may crave cigarettes, be irritable, feel very hungry, cough often, get headaches, or have difficulty concentrating. The withdrawal symptoms are only temporary. They are strongest when you first quit but will go away within 10-14 days. When withdrawal symptoms occur, stay in control. Think about  your reasons for quitting. Remind yourself that these are signs that your body is healing and getting used to being without cigarettes. Remember that withdrawal symptoms are easier to treat than the major diseases that smoking can cause.  Even after the withdrawal is over, expect periodic urges to smoke. However, these cravings are generally short lived and will go away whether you smoke or not. Do not smoke! WHAT RESOURCES ARE AVAILABLE TO HELP ME QUIT SMOKING? Your health care provider can direct you to community resources or hospitals for support, which may include:  Group support.  Education.  Hypnosis.  Therapy.   This information is not intended to replace advice given to you by your health care provider. Make sure you discuss any questions you have with your health care provider.   Document Released: 03/28/2004 Document Revised: 07/21/2014 Document Reviewed: 12/16/2012 Elsevier Interactive Patient Education 2016 [employer].    Bupropion sustained-release tablets (smoking cessation) What is this medicine? BUPROPION (byoo PROE pee on) is used to help people quit smoking. This medicine may be used for other purposes; ask your health care provider or pharmacist if you have questions. What should I tell my health care provider before I take this medicine? They need to know if you have any of these conditions: -an eating disorder, such as anorexia or bulimia -bipolar disorder or psychosis -diabetes or high blood sugar, treated with medication -glaucoma -head injury or brain tumor -heart disease, previous heart attack, or irregular heart beat -high blood pressure -kidney or liver disease -seizures -suicidal thoughts or a previous suicide attempt -Tourette's syndrome -weight loss -an unusual or allergic reaction to bupropion, other medicines, foods, dyes, or preservatives -breast-feeding -pregnant or trying to become pregnant How should I use this medicine? Take this  medicine by mouth with a glass of water. Follow the directions on the prescription label. You can take it with or without food. If it upsets your stomach, take it with food. Do not cut, crush or chew this medicine. Take your medicine at regular intervals. If you take this medicine more than once a day, take your second dose at least 8 hours after you take your first dose. To limit difficulty in sleeping, avoid taking this medicine at bedtime. Do not take your medicine more often than directed. Do not stop taking this medicine suddenly except upon the advice of your doctor. Stopping this medicine too quickly may cause serious side effects. A special MedGuide will be given to you by the pharmacist with each prescription and refill. Be sure to read this information carefully each time. Talk to your pediatrician regarding the use of  this medicine in children. Special care may be needed. Overdosage: If you think you have taken too much of this medicine contact a poison control center or emergency room at once. NOTE: This medicine is only for you. Do not share this medicine with others. What if I miss a dose? If you miss a dose, skip the missed dose and take your next tablet at the regular time. There should be at least 8 hours between doses. Do not take double or extra doses. What may interact with this medicine? Do not take this medicine with any of the following medications: -linezolid -MAOIs like Azilect, Carbex, Eldepryl, Marplan, Nardil, and Parnate -methylene blue (injected into a vein) -other medicines that contain bupropion like Wellbutrin This medicine may also interact with the following medications: -alcohol -certain medicines for anxiety or sleep -certain medicines for blood pressure like metoprolol, propranolol -certain medicines for depression or psychotic disturbances -certain medicines for HIV or AIDS like efavirenz, lopinavir, nelfinavir, ritonavir -certain medicines for irregular  heart beat like propafenone, flecainide -certain medicines for Parkinson's disease like amantadine, levodopa -certain medicines for seizures like carbamazepine, phenytoin, phenobarbital -cimetidine -clopidogrel -cyclophosphamide -furazolidone -isoniazid -nicotine -orphenadrine -procarbazine -steroid medicines like prednisone or cortisone -stimulant medicines for attention disorders, weight loss, or to stay awake -tamoxifen -theophylline -thiotepa -ticlopidine -tramadol -warfarin This list may not describe all possible interactions. Give your health care provider a list of all the medicines, herbs, non-prescription drugs, or dietary supplements you use. Also tell them if you smoke, drink alcohol, or use illegal drugs. Some items may interact with your medicine. What should I watch for while using this medicine? Visit your doctor or health care professional for regular checks on your progress. This medicine should be used together with a patient support program. It is important to participate in a behavioral program, counseling, or other support program that is recommended by your health care professional. Patients and their families should watch out for new or worsening thoughts of suicide or depression. Also watch out for sudden changes in feelings such as feeling anxious, agitated, panicky, irritable, hostile, aggressive, impulsive, severely restless, overly excited and hyperactive, or not being able to sleep. If this happens, especially at the beginning of treatment or after a change in dose, call your health care professional. Avoid alcoholic drinks while taking this medicine. Drinking excessive alcoholic beverages, using sleeping or anxiety medicines, or quickly stopping the use of these agents while taking this medicine may increase your risk for a seizure. Do not drive or use heavy machinery until you know how this medicine affects you. This medicine can impair your ability to perform  these tasks. Do not take this medicine close to bedtime. It may prevent you from sleeping. Your mouth may get dry. Chewing sugarless gum or sucking hard candy, and drinking plenty of water may help. Contact your doctor if the problem does not go away or is severe. Do not use nicotine patches or chewing gum without the advice of your doctor or health care professional while taking this medicine. You may need to have your blood pressure taken regularly if your doctor recommends that you use both nicotine and this medicine together. What side effects may I notice from receiving this medicine? Side effects that you should report to your doctor or health care professional as soon as possible: -allergic reactions like skin rash, itching or hives, swelling of the face, lips, or tongue -breathing problems -changes in vision -confusion -fast or irregular heartbeat -hallucinations -increased blood pressure -redness,  blistering, peeling or loosening of the skin, including inside the mouth -seizures -suicidal thoughts or other mood changes -unusually weak or tired -vomiting Side effects that usually do not require medical attention (report to your doctor or health care professional if they continue or are bothersome): -change in sex drive or performance -constipation -headache -loss of appetite -nausea -tremors -weight loss This list may not describe all possible side effects. Call your doctor for medical advice about side effects. You may report side effects to FDA at 1-800-FDA-1088. Where should I keep my medicine? Keep out of the reach of children. Store at room temperature between 20 and 25 degrees C (68 and 77 degrees F). Protect from light. Keep container tightly closed. Throw away any unused medicine after the expiration date. NOTE: This sheet is a summary. It may not cover all possible information. If you have questions about this medicine, talk to your doctor, pharmacist, or health care  provider.    2016, Elsevier/Gold Standard. (2013-02-25 10:55:10)    Food Choices for Gastroesophageal Reflux Disease, Adult When you have gastroesophageal reflux disease (GERD), the foods you eat and your eating habits are very important. Choosing the right foods can help ease the discomfort of GERD. WHAT GENERAL GUIDELINES DO I NEED TO FOLLOW?  Choose fruits, vegetables, whole grains, low-fat dairy products, and low-fat meat, fish, and poultry.  Limit fats such as oils, salad dressings, butter, nuts, and avocado.  Keep a food diary to identify foods that cause symptoms.  Avoid foods that cause reflux. These may be different for different people.  Eat frequent small meals instead of three large meals each day.  Eat your meals slowly, in a relaxed setting.  Limit fried foods.  Cook foods using methods other than frying.  Avoid drinking alcohol.  Avoid drinking large amounts of liquids with your meals.  Avoid bending over or lying down until 2-3 hours after eating. WHAT FOODS ARE NOT RECOMMENDED? The following are some foods and drinks that may worsen your symptoms: Vegetables Tomatoes. Tomato juice. Tomato and spaghetti sauce. Chili peppers. Onion and garlic. Horseradish. Fruits Oranges, grapefruit, and lemon (fruit and juice). Meats High-fat meats, fish, and poultry. This includes hot dogs, ribs, ham, sausage, salami, and bacon. Dairy Whole milk and chocolate milk. Sour cream. Cream. Butter. Ice cream. Cream cheese.  Beverages Coffee and tea, with or without caffeine. Carbonated beverages or energy drinks. Condiments Hot sauce. Barbecue sauce.  Sweets/Desserts Chocolate and cocoa. Donuts. Peppermint and spearmint. Fats and Oils High-fat foods, including [place] fries and potato chips. Other Vinegar. Strong spices, such as black pepper, white pepper, red pepper, cayenne, curry powder, cloves, ginger, and chili powder. The items listed above may not be a complete list  of foods and beverages to avoid. Contact your dietitian for more information.   This information is not intended to replace advice given to you by your health care provider. Make sure you discuss any questions you have with your health care provider.   Document Released: 06/30/2005 Document Revised: 07/21/2014 Document Reviewed: 05/04/2013 Elsevier Interactive Patient Education [employer]

## 2015-07-19 NOTE — Progress Notes (Signed)
    MRN: [id number] DOB: 11-02-1960  Subjective:   Justin Nolan is a 55 y.o. male presenting for chief complaint of Ear Fullness; Cough; congestion; Medication Refill; Flu Vaccine; and Knee Pain  Cough - Reports 1.5 week history of cough, chest congestion, sinus congestion, right ear pressure. Has tried Sudafed with minimal relief. Denies fever, chest pain, shob, wheezing, ear pain, ear drainage, sinus pain, n/v, abdominal pain. Patient is cutting back on smoking, down from 1.5ppd to 1 pack every 2-3 days. Admits history of pneumonia 3 years ago, had to be hospitalized.  GERD - managed with Protonix. He would like a refill today. Admits this medication helps him very much but he does not avoid GERD causing foods.  BPH - patient was started on Flomax for difficulty with urination. Reports waiting a long time to urinate, weakened stream, incomplete emptying. He has not been evaluated by urology and would like a consult.  Justin Nolan has a current medication list which includes the following prescription(s): lisinopril, pantoprazole, tamsulosin, hydrocodone-acetaminophen, multivitamin with minerals, and sucralfate. Also has No Known Allergies.  Justin Nolan  has a past medical history of Hypertension; Pneumonia (02/09/2012); and GERD (gastroesophageal reflux disease). Also  has past surgical history that includes Knee surgery.  Objective:   Vitals: BP 144/94 mmHg  Pulse 80  Temp(Src) 97.7 F (36.5 C) (Oral)  Resp 16  Ht 6' (1.829 m)  Wt 220 lb 9.6 oz (100.064 kg)  BMI 29.91 kg/m2  SpO2 97%  Physical Exam  Constitutional: He is oriented to person, place, and time. He appears well-developed and well-nourished.  HENT:  TM's flat bilaterally, no effusions or erythema. Nasal turbinates pink and moist. No sinus tenderness. Postnasal drip present, without oropharyngeal exudates, erythema or abscesses.  Eyes: Right eye exhibits no discharge. Left eye exhibits no discharge. No scleral icterus.  Neck: Normal  range of motion. Neck supple.  Cardiovascular: Normal rate, regular rhythm and intact distal pulses.  Exam reveals no gallop and no friction rub.   No murmur heard. Pulmonary/Chest: No respiratory distress. He has no wheezes. He has rales (coarse lung sounds throughout).  Lymphadenopathy:    He has no cervical adenopathy.  Neurological: He is alert and oriented to person, place, and time.  Skin: Skin is warm and dry. No rash noted. No erythema. No pallor.   [facility] reading (PRIMARY) by  Dr. Neva SeatGreene and PA-Kouper Spinella. Chest - increased markings but no obvious infiltrates.  Assessment and Plan :   1. Cough 2. Chest congestion 3. Sinus congestion 4. Ear pressure, right 5. Tobacco use disorder 6. Acute bronchitis, unspecified organism - Will cover for infectious process with Azithromycin. Hycodan and Tessalon for cough. RTC in 1-2 weeks if no improvement - Counseled on smoking cessation. Patient will consider Wellbutrin if he is unable to quit smoking on his own.  7. BPH (benign prostatic hyperplasia) - Refill Flomax, referral to Urology for further evaluation.  8. Gastroesophageal reflux disease, esophagitis presence not specified - Counseled on diagnosis of GERD. Refilled Protonix. Recommended dietary modifications. Patient to let me know if he would like to wean himself off of Protonix.  Wallis BambergMario Cherelle Midkiff, PA-C [facility] [phone] 07/19/2015 8:30 AM

## 2015-08-09 ENCOUNTER — Other Ambulatory Visit: Payer: Self-pay | Admitting: Urgent Care

## 2015-08-09 DIAGNOSIS — F17210 Nicotine dependence, cigarettes, uncomplicated: Secondary | ICD-10-CM

## 2015-08-09 DIAGNOSIS — R911 Solitary pulmonary nodule: Secondary | ICD-10-CM

## 2015-08-09 DIAGNOSIS — F17200 Nicotine dependence, unspecified, uncomplicated: Secondary | ICD-10-CM

## 2015-08-09 NOTE — Progress Notes (Signed)
Spoke with patient regarding a pulmonary nodule that has slowly enlarged since 2013. He would like to pursue a repeat chest CT for this. He is still smoking, has ~25 pack year history. Has been working on quitting.

## 2015-09-24 ENCOUNTER — Ambulatory Visit (INDEPENDENT_AMBULATORY_CARE_PROVIDER_SITE_OTHER): Payer: BLUE CROSS/BLUE SHIELD | Admitting: Family Medicine

## 2015-09-24 ENCOUNTER — Ambulatory Visit (INDEPENDENT_AMBULATORY_CARE_PROVIDER_SITE_OTHER): Payer: Self-pay | Admitting: Family Medicine

## 2015-09-24 ENCOUNTER — Encounter: Payer: Self-pay | Admitting: Family Medicine

## 2015-09-24 VITALS — BP 170/103 | HR 75 | Temp 98.8°F | Resp 16 | Ht 73.0 in | Wt 221.0 lb

## 2015-09-24 VITALS — BP 185/123 | HR 71 | Temp 98.8°F | Resp 16 | Ht 73.0 in | Wt 221.0 lb

## 2015-09-24 DIAGNOSIS — I10 Essential (primary) hypertension: Secondary | ICD-10-CM | POA: Diagnosis not present

## 2015-09-24 DIAGNOSIS — M25561 Pain in right knee: Secondary | ICD-10-CM

## 2015-09-24 DIAGNOSIS — Z021 Encounter for pre-employment examination: Secondary | ICD-10-CM

## 2015-09-24 DIAGNOSIS — Z0000 Encounter for general adult medical examination without abnormal findings: Secondary | ICD-10-CM

## 2015-09-24 MED ORDER — AMLODIPINE BESYLATE 5 MG PO TABS: 5.0000 mg | ORAL_TABLET | Freq: Every day | ORAL | Status: DC

## 2015-09-24 NOTE — Patient Instructions (Signed)
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  I've given you a 1 time 3 month renewal. Please return in 3 weeks to make sure the new blood pressure medicine that called in for you is working well.

## 2015-09-24 NOTE — Progress Notes (Signed)
° °  Subjective:    Patient ID: Justin Footsony M Nolan, male    DOB: [birth year], 55 y.o.   MRN: [id number] By signing my name below, I, Littie Deedsichard Sun, attest that this documentation has been prepared under the direction and in the presence of Elvina SidleKurt Lauenstein, MD.  Electronically Signed: Littie Deedsichard Sun, Medical Scribe. 09/24/2015. 9:00 AM.  HPI HPI Comments: Justin Footsony M Nolan is a 55 y.o. male who presents to the [facility] complaining of right knee pain that started about 1 year ago. Patient would like a referral to have an MRI for his right knee. He has been having some problems with his right knee for the past year and has been going to [facility]. He notes the knee began swelling while he was at work. He had been told it was a meniscus problem. Patient had a left knee patella tendon surgery done [facility] about 13 years ago.   Patient comes here for primary care.   Review of Systems  Musculoskeletal: Positive for arthralgias.       Objective:   Physical Exam CONSTITUTIONAL: Well developed/well nourished HEAD: Normocephalic/atraumatic EYES: EOM/PERRL ENMT: Mucous membranes moist NECK: supple no meningeal signs SPINE: entire spine nontender CV: S1/S2 noted, no murmurs/rubs/gallops noted LUNGS: Lungs are clear to auscultation bilaterally, no apparent distress ABDOMEN: soft, nontender, no rebound or guarding GU: no cva tenderness NEURO: Pt is awake/alert, moves all extremitiesx4 EXTREMITIES: pulses normal, full ROM, mild diffuse swelling of right knee.  Old surgical scar left knee SKIN: warm, color normal PSYCH: no abnormalities of mood noted        Assessment & Plan:   This chart was scribed in my presence and reviewed by me personally.    ICD-9-CM ICD-10-CM   1. Essential hypertension 401.9 I10 amLODipine (NORVASC) 5 MG tablet  2. Right knee pain 719.46 M25.561 MR Knee Right Wo Contrast     Ambulatory referral to Orthopedic Surgery      Signed, Elvina SidleKurt Lauenstein, MD

## 2015-09-24 NOTE — Progress Notes (Signed)
   Subjective:    Patient ID: Justin Nolan, male    DOB: 11-Jun-1961, 55 y.o.   MRN: [id number] By signing my name below, I, Littie Deedsichard Sun, attest that this documentation has been prepared under the direction and in the presence of Elvina SidleKurt Zamira Hickam, MD.  Electronically Signed: Littie Deedsichard Sun, Medical Scribe. 09/24/2015. 8:37 AM.  HPI HPI Comments: Justin Footsony M Tobia is a 55 y.o. 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  Patient would like a referral to have an MRI for his right knee. He has been having some problems with his right knee for the past year and has been going to [facility]. He notes the knee began swelling while he was at work. He had been told it was a meniscus problem. Patient does not have any problems with driving due to the knee. He had a left knee patella tendon surgery done [facility] about 13 years ago.  Patient comes here for primary care.  Review of Systems  Musculoskeletal: Positive for arthralgias.       Objective:   Physical Exam CONSTITUTIONAL: Well developed/well nourished HEAD: Normocephalic/atraumatic EYES: EOM/PERRL ENMT: Mucous membranes moist NECK: supple no meningeal signs SPINE: entire spine nontender CV: S1/S2 noted, no murmurs/rubs/gallops noted. Repeat blood pressure: 170/94. LUNGS: Lungs are clear to auscultation bilaterally, no apparent distress ABDOMEN: soft, nontender, no rebound or guarding GU: no cva tenderness NEURO: Pt is awake/alert, moves all extremitiesx4 EXTREMITIES: pulses normal, full ROM, swollen right knee SKIN: warm, color normal PSYCH: no abnormalities of mood noted        Assessment & Plan:   Blood pressure too high, needs 3 month certificate. Told to come back in 3 weeks for repeat blood pressure check on your medicine  Signed, Sheila OatsKurt Conya Ellinwood M.D.

## 2015-09-24 NOTE — Patient Instructions (Signed)
Take the amlodipine daily. You can stop the lisinopril. Please come back in 3 weeks for a blood pressure check.  I have ordered the MRI for your knee.

## 2015-09-28 ENCOUNTER — Ambulatory Visit
Admission: RE | Admit: 2015-09-28 | Discharge: 2015-09-28 | Disposition: A | Discharge status: Home / Self Care | Payer: BLUE CROSS/BLUE SHIELD | Source: Ambulatory Visit | Attending: Family Medicine | Admitting: Family Medicine

## 2015-09-28 DIAGNOSIS — M25561 Pain in right knee: Secondary | ICD-10-CM

## 2015-10-02 ENCOUNTER — Other Ambulatory Visit: Payer: Self-pay

## 2015-11-15 ENCOUNTER — Telehealth: Payer: Self-pay

## 2015-11-15 NOTE — Telephone Encounter (Signed)
Pt is looking for mri results   Best number [phone]

## 2015-11-16 NOTE — Telephone Encounter (Signed)
Spoke with pt, advised him of report. Pt will pick up images to take [facility] to get advise on the next step.

## 2016-02-21 ENCOUNTER — Other Ambulatory Visit: Payer: Self-pay | Admitting: Urgent Care

## 2016-02-21 DIAGNOSIS — N400 Enlarged prostate without lower urinary tract symptoms: Secondary | ICD-10-CM

## 2017-04-27 ENCOUNTER — Ambulatory Visit (INDEPENDENT_AMBULATORY_CARE_PROVIDER_SITE_OTHER): Payer: Non-veteran care

## 2017-04-27 ENCOUNTER — Ambulatory Visit (INDEPENDENT_AMBULATORY_CARE_PROVIDER_SITE_OTHER): Payer: Non-veteran care | Admitting: Orthopaedic Surgery

## 2017-04-27 ENCOUNTER — Encounter (INDEPENDENT_AMBULATORY_CARE_PROVIDER_SITE_OTHER): Payer: Self-pay | Admitting: Orthopaedic Surgery

## 2017-04-27 DIAGNOSIS — G8929 Other chronic pain: Secondary | ICD-10-CM | POA: Diagnosis not present

## 2017-04-27 DIAGNOSIS — M1711 Unilateral primary osteoarthritis, right knee: Secondary | ICD-10-CM

## 2017-04-27 DIAGNOSIS — M25561 Pain in right knee: Secondary | ICD-10-CM | POA: Diagnosis not present

## 2017-04-27 NOTE — Progress Notes (Signed)
Office Visit Note   Patient: Justin Nolan           Date of Birth: 12/22/1960           MRN: [id number] Visit Date: 04/27/2017              Requested by: Gavin Potters, MD [address] [place], [place] [address] PCP: Patient, No Pcp Per   Assessment & Plan: Visit Diagnoses:  1. Primary osteoarthritis of right knee     Plan: Patient has advanced degenerative joint disease worst in the patellofemoral compartment. I showed him on a knee model the details of the surgery we also discussed the risks benefits alternatives to surgery. He understands and wishes to proceed. We discussed the expected recovery time in the physical therapy that goes along with the recovery. Questions encouraged and answered. We will get him scheduled in the near future.  Follow-Up Instructions: Return for 2 week postop visit.   Orders:  Orders Placed This Encounter  Procedures  . XR KNEE 3 VIEW RIGHT   No orders of the defined types were placed in this encounter.     Procedures: No procedures performed   Clinical Data: No additional findings.   Subjective: Chief Complaint  Patient presents with  . Right Knee - Pain    Patient is a very pleasant 56 year old, who comes in with years of right knee pain that has significantly affected his ability to perform ADLs. He has no quality of life. The pain does not radiate. He has had previous cortisone injections without any significant relief. MRI of the right knee has shown a he has tricompartmental degenerative joint disease. He has constant 10 out of 10 pain. He also has night pain. Denies any numbness and tingling.    Review of Systems  Constitutional: Negative.   All other systems reviewed and are negative.    Objective: Vital Signs: There were no vitals taken for this visit.  Physical Exam  Constitutional: He is oriented to person, place, and time. He appears well-developed and well-nourished.  HENT:  Head: Normocephalic and atraumatic.   Eyes: Pupils are equal, round, and reactive to light.  Neck: Neck supple.  Pulmonary/Chest: Effort normal.  Abdominal: Soft.  Musculoskeletal: Normal range of motion.  Neurological: He is alert and oriented to person, place, and time.  Skin: Skin is warm.  Psychiatric: He has a normal mood and affect. His behavior is normal. Judgment and thought content normal.  Nursing note and vitals reviewed.   Ortho Exam Right knee exam shows a small joint effusion. No surgical scars. Patella tracking is slightly lateral. Positive patellar crepitus or cause and cruciates are stable. Specialty Comments:  No specialty comments available.  Imaging: Xr Knee 3 View Right  Result Date: 04/27/2017 Advanced degenerative joint disease worst in the patellofemoral compartment    PMFS History: Patient Active Problem List   Diagnosis Date Noted  . Alcohol abuse 02/16/2012  . HTN (hypertension) 02/09/2012  . GERD (gastroesophageal reflux disease) 02/09/2012  . Tobacco use 02/09/2012  . Indirect hyperbilirubinemia 02/09/2012   Past Medical History:  Diagnosis Date  . GERD (gastroesophageal reflux disease)   . Hypertension   . Pneumonia 02/09/2012    Family History  Problem Relation Age of Onset  . Diabetes Maternal Aunt   . Diabetes Paternal Aunt     Past Surgical History:  Procedure Laterality Date  . KNEE SURGERY     Social History   Occupational History  . Not  on file.   Social History Main Topics  . Smoking status: Current Every Day Smoker    Packs/day: 0.50    Years: 25.00    Types: Cigarettes  . Smokeless tobacco: Never Used  . Alcohol use 1.8 oz/week    3 Shots of liquor per week     Comment: history of weekend binge drinking  . Drug use: No  . Sexual activity: Yes

## 2017-04-28 ENCOUNTER — Other Ambulatory Visit (INDEPENDENT_AMBULATORY_CARE_PROVIDER_SITE_OTHER): Payer: Self-pay

## 2017-04-29 ENCOUNTER — Encounter (HOSPITAL_COMMUNITY): Payer: Self-pay

## 2017-04-29 NOTE — Pre-Procedure Instructions (Signed)
Justin Nolan  04/29/2017      [facility] [place], [place] - [address] [place] [address] Phone: [phone] Fax: [phone]    Your procedure is scheduled on 05/07/2017.  Report to [facility] Admitting at [phone] A.M.  Call this number if you have problems the morning of surgery:  [phone]   Remember:  Do not eat food or drink liquids after midnight.  Continue all other medications as directed by your physician except follow these medication instructions before surgery   Take these medicines the morning of surgery with A SIP OF WATER:  Amlodipine (Norvasc) Pantoprazole (Protonix) tamsulosin (Flomax)  7 days prior to surgery STOP taking any Aspirin (unless otherwise instructed by your surgeon), Aleve, Naproxen, Ibuprofen, Motrin, Advil, Goody's, BC's, all herbal medications, fish oil, and all vitamins    Do not wear jewelry.  Do not wear lotions, powders, or colonges, or deodorant.  Men may shave face and neck.  Do not bring valuables to the hospital.  [facility] is not responsible for any belongings or valuables.  Contacts, eyeglasses, dentures or bridgework may not be worn into surgery.  Leave your suitcase in the car.  After surgery it may be brought to your room.  For patients admitted to the hospital, discharge time will be determined by your treatment team.  Patients discharged the day of surgery will not be allowed to drive home.   Name and phone number of your driver:    Special instructions:   [facility]- Preparing For Surgery  Before surgery, you can play an important role. Because skin is not sterile, your skin needs to be as free of germs as possible. You can reduce the number of germs on your skin by washing with CHG (chlorahexidine gluconate) Soap before surgery.  CHG is an antiseptic cleaner which kills germs and bonds with the skin to continue killing germs even after  washing.  Please do not use if you have an allergy to CHG or antibacterial soaps. If your skin becomes reddened/irritated stop using the CHG.  Do not shave (including legs and underarms) for at least 48 hours prior to first CHG shower. It is OK to shave your face.  Please follow these instructions carefully.   1. Shower the NIGHT BEFORE SURGERY and the MORNING OF SURGERY with CHG.   2. If you chose to wash your hair, wash your hair first as usual with your normal shampoo.  3. After you shampoo, rinse your hair and body thoroughly to remove the shampoo.  4. Use CHG as you would any other liquid soap. You can apply CHG directly to the skin and wash gently with a scrungie or a clean washcloth.   5. Apply the CHG Soap to your body ONLY FROM THE NECK DOWN.  Do not use on open wounds or open sores. Avoid contact with your eyes, ears, mouth and genitals (private parts). Wash Face and genitals (private parts)  with your normal soap.  6. Wash thoroughly, paying special attention to the area where your surgery will be performed.  7. Thoroughly rinse your body with warm water from the neck down.  8. DO NOT shower/wash with your normal soap after using and rinsing off the CHG Soap.  9. Pat yourself dry with a CLEAN TOWEL.  10. Wear CLEAN PAJAMAS to bed the night before surgery, wear comfortable clothes the morning of surgery  11. Place CLEAN SHEETS on your  bed the night of your first shower and DO NOT SLEEP WITH PETS.    Day of Surgery: Do not apply any deodorants/lotions.  Please wear clean clothes to the hospital/surgery center.      Please read over the following fact sheets that you were given. Pain Booklet, Coughing and Deep Breathing, Total Joint Packet, MRSA Information and Surgical Site Infection Prevention

## 2017-04-30 ENCOUNTER — Other Ambulatory Visit: Payer: Self-pay

## 2017-04-30 ENCOUNTER — Other Ambulatory Visit (INDEPENDENT_AMBULATORY_CARE_PROVIDER_SITE_OTHER): Payer: Self-pay | Admitting: Orthopaedic Surgery

## 2017-04-30 ENCOUNTER — Encounter (HOSPITAL_COMMUNITY): Payer: Self-pay

## 2017-04-30 ENCOUNTER — Encounter (HOSPITAL_COMMUNITY)
Admission: RE | Admit: 2017-04-30 | Discharge: 2017-04-30 | Disposition: A | Discharge status: Home / Self Care | Payer: Non-veteran care | Source: Ambulatory Visit | Attending: Orthopaedic Surgery | Admitting: Orthopaedic Surgery

## 2017-04-30 DIAGNOSIS — M1711 Unilateral primary osteoarthritis, right knee: Secondary | ICD-10-CM | POA: Insufficient documentation

## 2017-04-30 DIAGNOSIS — Z01818 Encounter for other preprocedural examination: Secondary | ICD-10-CM | POA: Insufficient documentation

## 2017-04-30 HISTORY — DX: Unspecified osteoarthritis, unspecified site: M19.90

## 2017-04-30 HISTORY — DX: Benign prostatic hyperplasia without lower urinary tract symptoms: N40.0

## 2017-04-30 LAB — CBC WITH DIFFERENTIAL/PLATELET
BASOS ABS: 0.1 10*3/uL (ref 0.0–0.1)
Basophils Relative: 1 %
EOS PCT: 1 %
Eosinophils Absolute: 0.1 10*3/uL (ref 0.0–0.7)
HCT: 42 % (ref 39.0–52.0)
Hemoglobin: 13.8 g/dL (ref 13.0–17.0)
LYMPHS PCT: 41 %
Lymphs Abs: 3.2 10*3/uL (ref 0.7–4.0)
MCH: 31.8 pg (ref 26.0–34.0)
MCHC: 32.9 g/dL (ref 30.0–36.0)
MCV: 96.8 fL (ref 78.0–100.0)
Monocytes Absolute: 0.5 10*3/uL (ref 0.1–1.0)
Monocytes Relative: 7 %
NEUTROS ABS: 4 10*3/uL (ref 1.7–7.7)
NEUTROS PCT: 50 %
PLATELETS: 181 10*3/uL (ref 150–400)
RBC: 4.34 MIL/uL (ref 4.22–5.81)
RDW: 12.8 % (ref 11.5–15.5)
WBC: 7.8 10*3/uL (ref 4.0–10.5)

## 2017-04-30 LAB — COMPREHENSIVE METABOLIC PANEL
ALT: 30 U/L (ref 17–63)
AST: 35 U/L (ref 15–41)
Albumin: 4.5 g/dL (ref 3.5–5.0)
Alkaline Phosphatase: 55 U/L (ref 38–126)
Anion gap: 9 (ref 5–15)
BUN: 15 mg/dL (ref 6–20)
CHLORIDE: 102 mmol/L (ref 101–111)
CO2: 28 mmol/L (ref 22–32)
CREATININE: 1.05 mg/dL (ref 0.61–1.24)
Calcium: 9.5 mg/dL (ref 8.9–10.3)
GFR calc Af Amer: >60 mL/min (ref >60)
GFR calc non Af Amer: >60 mL/min (ref >60)
Glucose, Bld: 116 mg/dL — ABNORMAL HIGH (ref 65–99)
Potassium: 3.5 mmol/L (ref 3.5–5.1)
Sodium: 139 mmol/L (ref 135–145)
Total Bilirubin: 1.3 mg/dL — ABNORMAL HIGH (ref 0.3–1.2)
Total Protein: 7.2 g/dL (ref 6.5–8.1)

## 2017-04-30 LAB — URINALYSIS, ROUTINE W REFLEX MICROSCOPIC
Bilirubin Urine: NEGATIVE
GLUCOSE, UA: NEGATIVE mg/dL
Hgb urine dipstick: NEGATIVE
Ketones, ur: NEGATIVE mg/dL
LEUKOCYTES UA: NEGATIVE
Nitrite: NEGATIVE
PH: 5 (ref 5.0–8.0)
Protein, ur: NEGATIVE mg/dL
SPECIFIC GRAVITY, URINE: 1.018 (ref 1.005–1.030)

## 2017-04-30 LAB — SURGICAL PCR SCREEN
MRSA, PCR: NEGATIVE
STAPHYLOCOCCUS AUREUS: NEGATIVE

## 2017-04-30 LAB — TYPE AND SCREEN
ABO/RH(D): A POS
ANTIBODY SCREEN: NEGATIVE

## 2017-04-30 LAB — APTT: APTT: 29 s (ref 24–36)

## 2017-04-30 LAB — C-REACTIVE PROTEIN: CRP: 0.8 mg/dL (ref <1.0)

## 2017-04-30 LAB — ABO/RH: ABO/RH(D): A POS

## 2017-04-30 LAB — PROTIME-INR
INR: 0.99
PROTHROMBIN TIME: 13 s (ref 11.4–15.2)

## 2017-04-30 LAB — SEDIMENTATION RATE: Sed Rate: 3 mm/hr (ref 0–16)

## 2017-04-30 NOTE — Progress Notes (Signed)
[facility] Cardiologist - patient denies  Chest x-ray - n/a EKG - 04/30/17 Stress Test - Patient denies ECHO -  patient states it was here 15+ years ago Cardiac Cath - patient denies   Sleep Study - patient denies   Patient denies shortness of breath, fever, cough and chest pain at PAT appointment   Patient verbalized understanding of instructions that were given to them at the PAT appointment. Patient was also instructed that they will need to review over the PAT instructions again at home before surgery.

## 2017-05-06 MED ORDER — TRANEXAMIC ACID 1000 MG/10ML IV SOLN
1000.0000 mg | INTRAVENOUS | Status: AC
  Administered 2017-05-07: 1000 mg via INTRAVENOUS
  Filled 2017-05-06: qty 10

## 2017-05-06 MED ORDER — BUPIVACAINE LIPOSOME 1.3 % IJ SUSP
20.0000 mL | INTRAMUSCULAR | Status: AC
  Administered 2017-05-07: 20 mL
  Filled 2017-05-06 (×3): qty 20

## 2017-05-06 MED ORDER — CEFAZOLIN SODIUM-DEXTROSE 2-4 GM/100ML-% IV SOLN
2.0000 g | INTRAVENOUS | Status: AC
  Administered 2017-05-07: 2 g via INTRAVENOUS
  Filled 2017-05-06: qty 100

## 2017-05-07 ENCOUNTER — Inpatient Hospital Stay (HOSPITAL_COMMUNITY)
Admission: RE | Admit: 2017-05-07 | Discharge: 2017-05-08 | DRG: 470 | Disposition: A | Discharge status: Home Health | Payer: Non-veteran care | Source: Ambulatory Visit | Attending: Orthopaedic Surgery | Admitting: Orthopaedic Surgery

## 2017-05-07 ENCOUNTER — Inpatient Hospital Stay (HOSPITAL_COMMUNITY): Payer: Non-veteran care | Admitting: Certified Registered Nurse Anesthetist

## 2017-05-07 ENCOUNTER — Encounter (HOSPITAL_COMMUNITY)
Admission: RE | Disposition: A | Discharge status: Home Health | Payer: Self-pay | Source: Ambulatory Visit | Attending: Orthopaedic Surgery

## 2017-05-07 ENCOUNTER — Inpatient Hospital Stay (HOSPITAL_COMMUNITY): Payer: Non-veteran care

## 2017-05-07 ENCOUNTER — Telehealth (INDEPENDENT_AMBULATORY_CARE_PROVIDER_SITE_OTHER): Payer: Self-pay | Admitting: Orthopaedic Surgery

## 2017-05-07 ENCOUNTER — Encounter (HOSPITAL_COMMUNITY): Payer: Self-pay

## 2017-05-07 DIAGNOSIS — D62 Acute posthemorrhagic anemia: Secondary | ICD-10-CM | POA: Diagnosis not present

## 2017-05-07 DIAGNOSIS — I10 Essential (primary) hypertension: Secondary | ICD-10-CM | POA: Diagnosis present

## 2017-05-07 DIAGNOSIS — Z833 Family history of diabetes mellitus: Secondary | ICD-10-CM | POA: Diagnosis not present

## 2017-05-07 DIAGNOSIS — M1711 Unilateral primary osteoarthritis, right knee: Principal | ICD-10-CM | POA: Diagnosis present

## 2017-05-07 DIAGNOSIS — N400 Enlarged prostate without lower urinary tract symptoms: Secondary | ICD-10-CM | POA: Diagnosis present

## 2017-05-07 DIAGNOSIS — F17210 Nicotine dependence, cigarettes, uncomplicated: Secondary | ICD-10-CM | POA: Diagnosis present

## 2017-05-07 DIAGNOSIS — Z96659 Presence of unspecified artificial knee joint: Secondary | ICD-10-CM

## 2017-05-07 DIAGNOSIS — Z7982 Long term (current) use of aspirin: Secondary | ICD-10-CM

## 2017-05-07 DIAGNOSIS — Z79899 Other long term (current) drug therapy: Secondary | ICD-10-CM

## 2017-05-07 DIAGNOSIS — K219 Gastro-esophageal reflux disease without esophagitis: Secondary | ICD-10-CM | POA: Diagnosis present

## 2017-05-07 DIAGNOSIS — M25761 Osteophyte, right knee: Secondary | ICD-10-CM | POA: Diagnosis present

## 2017-05-07 HISTORY — PX: TOTAL KNEE ARTHROPLASTY: SHX125

## 2017-05-07 SURGERY — ARTHROPLASTY, KNEE, TOTAL
Anesthesia: Spinal | Site: Knee | Laterality: Right

## 2017-05-07 MED ORDER — LACTATED RINGERS IV SOLN: INTRAVENOUS | Status: DC

## 2017-05-07 MED ORDER — TAMSULOSIN HCL 0.4 MG PO CAPS
0.4000 mg | ORAL_CAPSULE | Freq: Every day | ORAL | Status: DC
  Administered 2017-05-08: 0.4 mg via ORAL
  Filled 2017-05-07: qty 1

## 2017-05-07 MED ORDER — ASPIRIN EC 325 MG PO TBEC: 325.0000 mg | DELAYED_RELEASE_TABLET | Freq: Two times a day (BID) | ORAL | 0 refills | Status: DC

## 2017-05-07 MED ORDER — TIZANIDINE HCL 4 MG PO TABS: 4.0000 mg | ORAL_TABLET | Freq: Four times a day (QID) | ORAL | 2 refills | Status: DC | PRN

## 2017-05-07 MED ORDER — FENTANYL CITRATE (PF) 100 MCG/2ML IJ SOLN
INTRAMUSCULAR | Status: AC
  Filled 2017-05-07: qty 2

## 2017-05-07 MED ORDER — FLAX SEED OIL 1000 MG PO CAPS: 1000.0000 mg | ORAL_CAPSULE | Freq: Every day | ORAL | Status: DC

## 2017-05-07 MED ORDER — CHLORHEXIDINE GLUCONATE 4 % EX LIQD: 60.0000 mL | Freq: Once | CUTANEOUS | Status: DC

## 2017-05-07 MED ORDER — ACETAMINOPHEN 325 MG PO TABS: 650.0000 mg | ORAL_TABLET | ORAL | Status: DC | PRN

## 2017-05-07 MED ORDER — MEPERIDINE HCL 25 MG/ML IJ SOLN: 6.2500 mg | INTRAMUSCULAR | Status: DC | PRN

## 2017-05-07 MED ORDER — METOCLOPRAMIDE HCL 5 MG PO TABS: 5.0000 mg | ORAL_TABLET | Freq: Three times a day (TID) | ORAL | Status: DC | PRN

## 2017-05-07 MED ORDER — PROPOFOL 500 MG/50ML IV EMUL
INTRAVENOUS | Status: DC | PRN
  Administered 2017-05-07: 80 ug/kg/min via INTRAVENOUS

## 2017-05-07 MED ORDER — MENTHOL 3 MG MT LOZG: 1.0000 | LOZENGE | OROMUCOSAL | Status: DC | PRN

## 2017-05-07 MED ORDER — OXYCODONE HCL 5 MG PO TABS: 5.0000 mg | ORAL_TABLET | ORAL | 0 refills | Status: DC | PRN

## 2017-05-07 MED ORDER — TRANEXAMIC ACID 1000 MG/10ML IV SOLN
1000.0000 mg | Freq: Once | INTRAVENOUS | Status: AC
  Administered 2017-05-07: 1000 mg via INTRAVENOUS
  Filled 2017-05-07: qty 10

## 2017-05-07 MED ORDER — MIDAZOLAM HCL 2 MG/2ML IJ SOLN
2.0000 mg | Freq: Once | INTRAMUSCULAR | Status: AC
  Administered 2017-05-07: 2 mg via INTRAVENOUS

## 2017-05-07 MED ORDER — BUPIVACAINE-EPINEPHRINE (PF) 0.5% -1:200000 IJ SOLN
INTRAMUSCULAR | Status: DC | PRN
  Administered 2017-05-07: 30 mL via PERINEURAL

## 2017-05-07 MED ORDER — DEXAMETHASONE SODIUM PHOSPHATE 10 MG/ML IJ SOLN
INTRAMUSCULAR | Status: DC | PRN
  Administered 2017-05-07: 10 mg via INTRAVENOUS

## 2017-05-07 MED ORDER — BUPIVACAINE IN DEXTROSE 0.75-8.25 % IT SOLN
INTRATHECAL | Status: DC | PRN
  Administered 2017-05-07: 1.6 mL via INTRATHECAL

## 2017-05-07 MED ORDER — FENTANYL CITRATE (PF) 100 MCG/2ML IJ SOLN
100.0000 ug | Freq: Once | INTRAMUSCULAR | Status: AC
  Administered 2017-05-07: 100 ug via INTRAVENOUS

## 2017-05-07 MED ORDER — LYSINE 1000 MG PO TABS: 1000.0000 mg | ORAL_TABLET | Freq: Every day | ORAL | Status: DC

## 2017-05-07 MED ORDER — ONDANSETRON HCL 4 MG PO TABS: 4.0000 mg | ORAL_TABLET | Freq: Three times a day (TID) | ORAL | 0 refills | Status: DC | PRN

## 2017-05-07 MED ORDER — ASPIRIN EC 325 MG PO TBEC
325.0000 mg | DELAYED_RELEASE_TABLET | Freq: Two times a day (BID) | ORAL | Status: DC
  Administered 2017-05-08: 325 mg via ORAL
  Filled 2017-05-07: qty 1

## 2017-05-07 MED ORDER — FENTANYL CITRATE (PF) 100 MCG/2ML IJ SOLN
INTRAMUSCULAR | Status: AC
  Administered 2017-05-07: 50 ug via INTRAVENOUS
  Filled 2017-05-07: qty 2

## 2017-05-07 MED ORDER — FENTANYL CITRATE (PF) 250 MCG/5ML IJ SOLN
INTRAMUSCULAR | Status: AC
  Filled 2017-05-07: qty 5

## 2017-05-07 MED ORDER — MORPHINE SULFATE (PF) 4 MG/ML IV SOLN
1.0000 mg | INTRAVENOUS | Status: DC | PRN
  Administered 2017-05-07 – 2017-05-08 (×6): 1 mg via INTRAVENOUS
  Filled 2017-05-07 (×6): qty 1

## 2017-05-07 MED ORDER — ACETAMINOPHEN 650 MG RE SUPP: 650.0000 mg | RECTAL | Status: DC | PRN

## 2017-05-07 MED ORDER — AMLODIPINE BESYLATE 10 MG PO TABS
10.0000 mg | ORAL_TABLET | Freq: Every day | ORAL | Status: DC
  Administered 2017-05-08: 10 mg via ORAL
  Filled 2017-05-07: qty 1

## 2017-05-07 MED ORDER — PANTOPRAZOLE SODIUM 40 MG PO TBEC
40.0000 mg | DELAYED_RELEASE_TABLET | Freq: Every day | ORAL | Status: DC
  Administered 2017-05-08: 40 mg via ORAL
  Filled 2017-05-07: qty 1

## 2017-05-07 MED ORDER — TRANEXAMIC ACID 1000 MG/10ML IV SOLN
2000.0000 mg | Freq: Once | INTRAVENOUS | Status: DC
  Filled 2017-05-07: qty 20

## 2017-05-07 MED ORDER — METOCLOPRAMIDE HCL 5 MG/ML IJ SOLN: 10.0000 mg | Freq: Once | INTRAMUSCULAR | Status: DC | PRN

## 2017-05-07 MED ORDER — SORBITOL 70 % SOLN: 30.0000 mL | Freq: Every day | Status: DC | PRN

## 2017-05-07 MED ORDER — METHOCARBAMOL 500 MG PO TABS
500.0000 mg | ORAL_TABLET | Freq: Four times a day (QID) | ORAL | Status: DC | PRN
  Administered 2017-05-07 – 2017-05-08 (×3): 500 mg via ORAL
  Filled 2017-05-07 (×2): qty 1

## 2017-05-07 MED ORDER — LACTATED RINGERS IV SOLN
INTRAVENOUS | Status: DC
  Administered 2017-05-07 (×3): via INTRAVENOUS

## 2017-05-07 MED ORDER — FENTANYL CITRATE (PF) 100 MCG/2ML IJ SOLN
25.0000 ug | INTRAMUSCULAR | Status: DC | PRN
  Administered 2017-05-07 (×3): 50 ug via INTRAVENOUS

## 2017-05-07 MED ORDER — FENTANYL CITRATE (PF) 100 MCG/2ML IJ SOLN
INTRAMUSCULAR | Status: AC
  Administered 2017-05-07: 100 ug via INTRAVENOUS
  Filled 2017-05-07: qty 2

## 2017-05-07 MED ORDER — METOCLOPRAMIDE HCL 5 MG/ML IJ SOLN: 5.0000 mg | Freq: Three times a day (TID) | INTRAMUSCULAR | Status: DC | PRN

## 2017-05-07 MED ORDER — ONDANSETRON HCL 4 MG/2ML IJ SOLN: 4.0000 mg | Freq: Four times a day (QID) | INTRAMUSCULAR | Status: DC | PRN

## 2017-05-07 MED ORDER — CHLORTHALIDONE 25 MG PO TABS
12.5000 mg | ORAL_TABLET | Freq: Every day | ORAL | Status: DC
  Administered 2017-05-08: 12.5 mg via ORAL
  Filled 2017-05-07: qty 0.5

## 2017-05-07 MED ORDER — SODIUM CHLORIDE 0.9 % IV SOLN
INTRAVENOUS | Status: DC
  Administered 2017-05-07: 20:00:00 via INTRAVENOUS

## 2017-05-07 MED ORDER — SODIUM CHLORIDE 0.9 % IR SOLN
Status: DC | PRN
  Administered 2017-05-07: 3000 mL

## 2017-05-07 MED ORDER — 0.9 % SODIUM CHLORIDE (POUR BTL) OPTIME
TOPICAL | Status: DC | PRN
  Administered 2017-05-07: 1000 mL

## 2017-05-07 MED ORDER — PHENOL 1.4 % MT LIQD: 1.0000 | OROMUCOSAL | Status: DC | PRN

## 2017-05-07 MED ORDER — FENTANYL CITRATE (PF) 100 MCG/2ML IJ SOLN: 25.0000 ug | INTRAMUSCULAR | Status: DC | PRN

## 2017-05-07 MED ORDER — OXYCODONE HCL 5 MG PO TABS
ORAL_TABLET | ORAL | Status: AC
  Administered 2017-05-07: 10 mg via ORAL
  Filled 2017-05-07: qty 2

## 2017-05-07 MED ORDER — ACETAMINOPHEN 500 MG PO TABS
1000.0000 mg | ORAL_TABLET | Freq: Four times a day (QID) | ORAL | Status: AC
  Administered 2017-05-07 – 2017-05-08 (×4): 1000 mg via ORAL
  Filled 2017-05-07 (×4): qty 2

## 2017-05-07 MED ORDER — SENNOSIDES-DOCUSATE SODIUM 8.6-50 MG PO TABS: 1.0000 | ORAL_TABLET | Freq: Every evening | ORAL | 1 refills | Status: DC | PRN

## 2017-05-07 MED ORDER — MAGNESIUM CITRATE PO SOLN: 1.0000 | Freq: Once | ORAL | Status: DC | PRN

## 2017-05-07 MED ORDER — SODIUM CHLORIDE 0.9 % IV SOLN
INTRAVENOUS | Status: AC | PRN
  Administered 2017-05-07: 2000 mg via TOPICAL

## 2017-05-07 MED ORDER — FENTANYL CITRATE (PF) 250 MCG/5ML IJ SOLN
INTRAMUSCULAR | Status: DC | PRN
  Administered 2017-05-07: 50 ug via INTRAVENOUS

## 2017-05-07 MED ORDER — MIDAZOLAM HCL 2 MG/2ML IJ SOLN
INTRAMUSCULAR | Status: AC
  Filled 2017-05-07: qty 2

## 2017-05-07 MED ORDER — MSM 1000 MG PO CAPS: 1000.0000 mg | ORAL_CAPSULE | Freq: Every day | ORAL | Status: DC

## 2017-05-07 MED ORDER — ASPIRIN EC 81 MG PO TBEC: 81.0000 mg | DELAYED_RELEASE_TABLET | Freq: Every day | ORAL | Status: DC

## 2017-05-07 MED ORDER — METHOCARBAMOL 1000 MG/10ML IJ SOLN
500.0000 mg | Freq: Four times a day (QID) | INTRAVENOUS | Status: DC | PRN
  Filled 2017-05-07: qty 5

## 2017-05-07 MED ORDER — PROMETHAZINE HCL 25 MG PO TABS: 25.0000 mg | ORAL_TABLET | Freq: Four times a day (QID) | ORAL | 1 refills | Status: DC | PRN

## 2017-05-07 MED ORDER — ONDANSETRON HCL 4 MG/2ML IJ SOLN
INTRAMUSCULAR | Status: DC | PRN
  Administered 2017-05-07: 4 mg via INTRAVENOUS

## 2017-05-07 MED ORDER — DIPHENHYDRAMINE HCL 12.5 MG/5ML PO ELIX: 25.0000 mg | ORAL_SOLUTION | ORAL | Status: DC | PRN

## 2017-05-07 MED ORDER — CEFAZOLIN SODIUM-DEXTROSE 2-4 GM/100ML-% IV SOLN
2.0000 g | Freq: Four times a day (QID) | INTRAVENOUS | Status: AC
  Administered 2017-05-07 – 2017-05-08 (×2): 2 g via INTRAVENOUS
  Filled 2017-05-07 (×2): qty 100

## 2017-05-07 MED ORDER — PROPOFOL 500 MG/50ML IV EMUL
INTRAVENOUS | Status: AC
  Filled 2017-05-07: qty 50

## 2017-05-07 MED ORDER — PROPOFOL 1000 MG/100ML IV EMUL
INTRAVENOUS | Status: AC
  Filled 2017-05-07: qty 100

## 2017-05-07 MED ORDER — KETOROLAC TROMETHAMINE 30 MG/ML IJ SOLN
INTRAMUSCULAR | Status: AC
  Administered 2017-05-07: 30 mg
  Filled 2017-05-07: qty 1

## 2017-05-07 MED ORDER — METHOCARBAMOL 500 MG PO TABS
ORAL_TABLET | ORAL | Status: AC
  Administered 2017-05-07: 500 mg via ORAL
  Filled 2017-05-07: qty 1

## 2017-05-07 MED ORDER — DEXAMETHASONE SODIUM PHOSPHATE 10 MG/ML IJ SOLN
10.0000 mg | Freq: Once | INTRAMUSCULAR | Status: AC
  Administered 2017-05-08: 10 mg via INTRAVENOUS
  Filled 2017-05-07: qty 1

## 2017-05-07 MED ORDER — MIDAZOLAM HCL 2 MG/2ML IJ SOLN
INTRAMUSCULAR | Status: AC
  Administered 2017-05-07: 2 mg via INTRAVENOUS
  Filled 2017-05-07: qty 2

## 2017-05-07 MED ORDER — KETOROLAC TROMETHAMINE 15 MG/ML IJ SOLN
30.0000 mg | Freq: Four times a day (QID) | INTRAMUSCULAR | Status: AC
  Administered 2017-05-07 – 2017-05-08 (×4): 30 mg via INTRAVENOUS
  Filled 2017-05-07 (×3): qty 2

## 2017-05-07 MED ORDER — ALUM & MAG HYDROXIDE-SIMETH 200-200-20 MG/5ML PO SUSP: 30.0000 mL | ORAL | Status: DC | PRN

## 2017-05-07 MED ORDER — PROPOFOL 10 MG/ML IV BOLUS
INTRAVENOUS | Status: DC | PRN
  Administered 2017-05-07: 20 mg via INTRAVENOUS

## 2017-05-07 MED ORDER — MIDAZOLAM HCL 2 MG/2ML IJ SOLN
INTRAMUSCULAR | Status: DC | PRN
  Administered 2017-05-07: 2 mg via INTRAVENOUS

## 2017-05-07 MED ORDER — ONDANSETRON HCL 4 MG PO TABS: 4.0000 mg | ORAL_TABLET | Freq: Four times a day (QID) | ORAL | Status: DC | PRN

## 2017-05-07 MED ORDER — OXYCODONE HCL 5 MG PO TABS
5.0000 mg | ORAL_TABLET | ORAL | Status: DC | PRN
  Administered 2017-05-07: 10 mg via ORAL
  Administered 2017-05-07 – 2017-05-08 (×2): 15 mg via ORAL
  Administered 2017-05-08 (×2): 10 mg via ORAL
  Filled 2017-05-07: qty 3
  Filled 2017-05-07 (×2): qty 2
  Filled 2017-05-07: qty 3

## 2017-05-07 MED ORDER — POLYETHYLENE GLYCOL 3350 17 G PO PACK: 17.0000 g | PACK | Freq: Every day | ORAL | Status: DC | PRN

## 2017-05-07 MED ORDER — OXYCODONE HCL ER 15 MG PO T12A
15.0000 mg | EXTENDED_RELEASE_TABLET | Freq: Two times a day (BID) | ORAL | Status: DC
  Administered 2017-05-07 – 2017-05-08 (×2): 15 mg via ORAL
  Filled 2017-05-07 (×2): qty 1

## 2017-05-07 MED ORDER — OXYCODONE HCL ER 10 MG PO T12A: 10.0000 mg | EXTENDED_RELEASE_TABLET | Freq: Two times a day (BID) | ORAL | 0 refills | Status: DC

## 2017-05-07 SURGICAL SUPPLY — 65 items
ALCOHOL ISOPROPYL (RUBBING) (MISCELLANEOUS) ×2 IMPLANT
APL SKNCLS STERI-STRIP NONHPOA (GAUZE/BANDAGES/DRESSINGS) ×1
BAG DECANTER FOR FLEXI CONT (MISCELLANEOUS) ×2 IMPLANT
BANDAGE ACE 6X5 VEL STRL LF (GAUZE/BANDAGES/DRESSINGS) ×4 IMPLANT
BANDAGE ESMARK 6X9 LF (GAUZE/BANDAGES/DRESSINGS) ×1 IMPLANT
BENZOIN TINCTURE PRP APPL 2/3 (GAUZE/BANDAGES/DRESSINGS) ×2 IMPLANT
BLADE SAW SGTL 13.0X1.19X90.0M (BLADE) ×1 IMPLANT
BNDG CMPR 9X6 STRL LF SNTH (GAUZE/BANDAGES/DRESSINGS) ×1
BNDG ESMARK 6X9 LF (GAUZE/BANDAGES/DRESSINGS) ×2
BOWL SMART MIX CTS (DISPOSABLE) ×2 IMPLANT
CAPT KNEE TRIATH TK-4 ×1 IMPLANT
CLSR STERI-STRIP ANTIMIC 1/2X4 (GAUZE/BANDAGES/DRESSINGS) ×4 IMPLANT
COVER SURGICAL LIGHT HANDLE (MISCELLANEOUS) ×2 IMPLANT
CUFF TOURNIQUET SINGLE 34IN LL (TOURNIQUET CUFF) ×2 IMPLANT
CUFF TOURNIQUET SINGLE 44IN (TOURNIQUET CUFF) IMPLANT
DRAPE EXTREMITY T 121X128X90 (DRAPE) ×2 IMPLANT
DRAPE HALF SHEET 40X57 (DRAPES) ×2 IMPLANT
DRAPE INCISE IOBAN 66X45 STRL (DRAPES) IMPLANT
DRAPE ORTHO SPLIT 77X108 STRL (DRAPES) ×4
DRAPE SURG 17X11 SM STRL (DRAPES) ×4 IMPLANT
DRAPE SURG ORHT 6 SPLT 77X108 (DRAPES) ×2 IMPLANT
DRSG AQUACEL AG ADV 3.5X10 (GAUZE/BANDAGES/DRESSINGS) ×1 IMPLANT
DRSG AQUACEL AG ADV 3.5X14 (GAUZE/BANDAGES/DRESSINGS) ×2 IMPLANT
DURAPREP 26ML APPLICATOR (WOUND CARE) ×4 IMPLANT
ELECT CAUTERY BLADE 6.4 (BLADE) ×2 IMPLANT
ELECT REM PT RETURN 9FT ADLT (ELECTROSURGICAL) ×2
ELECTRODE REM PT RTRN 9FT ADLT (ELECTROSURGICAL) ×1 IMPLANT
GLOVE SKINSENSE NS SZ7.5 (GLOVE) ×1
GLOVE SKINSENSE STRL SZ7.5 (GLOVE) ×1 IMPLANT
GLOVE SURG SYN 7.5  E (GLOVE) ×4
GLOVE SURG SYN 7.5 E (GLOVE) ×4 IMPLANT
GLOVE SURG SYN 7.5 PF PI (GLOVE) ×4 IMPLANT
GOWN STRL REIN XL XLG (GOWN DISPOSABLE) ×2 IMPLANT
GOWN STRL REUS W/ TWL LRG LVL3 (GOWN DISPOSABLE) ×1 IMPLANT
GOWN STRL REUS W/TWL LRG LVL3 (GOWN DISPOSABLE) ×2
HANDPIECE INTERPULSE COAX TIP (DISPOSABLE) ×2
HOOD PEEL AWAY FLYTE STAYCOOL (MISCELLANEOUS) ×4 IMPLANT
KIT BASIN OR (CUSTOM PROCEDURE TRAY) ×2 IMPLANT
KIT ROOM TURNOVER OR (KITS) ×2 IMPLANT
MANIFOLD NEPTUNE II (INSTRUMENTS) ×2 IMPLANT
MARKER SKIN DUAL TIP RULER LAB (MISCELLANEOUS) ×2 IMPLANT
NDL SPNL 18GX3.5 QUINCKE PK (NEEDLE) ×1 IMPLANT
NEEDLE SPNL 18GX3.5 QUINCKE PK (NEEDLE) ×2 IMPLANT
NS IRRIG 1000ML POUR BTL (IV SOLUTION) ×2 IMPLANT
PACK TOTAL JOINT (CUSTOM PROCEDURE TRAY) ×2 IMPLANT
PAD ARMBOARD 7.5X6 YLW CONV (MISCELLANEOUS) ×4 IMPLANT
SAW OSC TIP CART 19.5X105X1.3 (SAW) ×2 IMPLANT
SET HNDPC FAN SPRY TIP SCT (DISPOSABLE) ×1 IMPLANT
STAPLER VISISTAT 35W (STAPLE) IMPLANT
STRIP CLOSURE SKIN 1/2X4 (GAUZE/BANDAGES/DRESSINGS) ×1 IMPLANT
SUCTION FRAZIER HANDLE 10FR (MISCELLANEOUS) ×1
SUCTION TUBE FRAZIER 10FR DISP (MISCELLANEOUS) ×1 IMPLANT
SUT ETHILON 2 0 FS 18 (SUTURE) IMPLANT
SUT MNCRL AB 4-0 PS2 18 (SUTURE) IMPLANT
SUT VIC AB 0 CT1 27 (SUTURE) ×4
SUT VIC AB 0 CT1 27XBRD ANBCTR (SUTURE) ×2 IMPLANT
SUT VIC AB 1 CTX 27 (SUTURE) ×6 IMPLANT
SUT VIC AB 2-0 CT1 27 (SUTURE) ×6
SUT VIC AB 2-0 CT1 TAPERPNT 27 (SUTURE) ×3 IMPLANT
SYR 50ML LL SCALE MARK (SYRINGE) ×2 IMPLANT
TOWEL OR 17X24 6PK STRL BLUE (TOWEL DISPOSABLE) ×2 IMPLANT
TOWEL OR 17X26 10 PK STRL BLUE (TOWEL DISPOSABLE) ×2 IMPLANT
TRAY CATH 16FR W/PLASTIC CATH (SET/KITS/TRAYS/PACK) IMPLANT
UNDERPAD 30X30 (UNDERPADS AND DIAPERS) ×2 IMPLANT
WRAP KNEE MAXI GEL POST OP (GAUZE/BANDAGES/DRESSINGS) ×2 IMPLANT

## 2017-05-07 NOTE — Discharge Instructions (Signed)

## 2017-05-07 NOTE — Op Note (Signed)
Total Knee Arthroplasty Procedure Note  Preoperative diagnosis: Right knee osteoarthritis  Postoperative diagnosis:same  Operative procedure: Right total knee arthroplasty. CPT [phone]  Surgeon: N. Glee Arvin, MD  Assistants: Hart Carwin, RNFA  Anesthesia: Spinal, regional  Tourniquet time: 60 mins  Implants used: Stryker Triatholon Femur: PS 5 Tibia: 6 Patella: 29 mm, 9 thick Polyethylene: 9 mm  Indication: Justin Nolan is a 55 y.o. year old male with a history of knee pain. Having failed conservative management, the patient elected to proceed with a total knee arthroplasty.  We have reviewed the risk and benefits of the surgery and they elected to proceed after voicing understanding.  Procedure:  After informed consent was obtained and understanding of the risk were voiced including but not limited to bleeding, infection, damage to surrounding structures including nerves and vessels, blood clots, leg length inequality and the failure to achieve desired results, the operative extremity was marked with verbal confirmation of the patient in the holding area.   The patient was then brought to the operating room and transported to the operating room table in the supine position.  A tourniquet was applied to the operative extremity around the upper thigh. The operative limb was then prepped and draped in the usual sterile fashion and preoperative antibiotics were administered.  A time out was performed prior to the start of surgery confirming the correct extremity, preoperative antibiotic administration, as well as team members, implants and instruments available for the case. Correct surgical site was also confirmed with preoperative radiographs. The limb was then elevated for exsanguination and the tourniquet was inflated. A midline incision was made and a standard medial parapatellar approach was performed.  The patella was prepared and sized to a 29 mm.  A cover was placed on the  patella for protection from retractors.  We then turned our attention to the femur. Posterior cruciate ligament was sacrificed. Start site was drilled in the femur and the intramedullary distal femoral cutting guide was placed, set at 5 degrees valgus, taking 9 mm of distal resection. The distal cut was made. Osteophytes were then removed. Next, the proximal tibial cutting guide was placed with appropriate slope, varus/valgus alignment and depth of resection. The proximal tibial cut was made. Gap blocks were then used to assess the extension gap and alignment, and appropriate soft tissue releases were performed. Attention was turned back to the femur, which was sized using the sizing guide to a size 5. Appropriate rotation of the femoral component was determined using epicondylar axis, Whiteside's line, and assessing the flexion gap under ligament tension. The appropriate size 4-in-1 cutting block was placed and cuts were made. Posterior femoral osteophytes and uncapped bone were then removed with the curved osteotome. The tibia was sized for a size 6 component. The femoral box-cutting guide was placed and prepared for a PS femoral component. Trial components were placed, and stability was checked in full extension, mid-flexion, and deep flexion. Proper tibial rotation was determined and marked.  The patella tracked well without a lateral release. Trial components were then removed and tibial preparation performed. A posterior capsular injection comprising of 20 cc of 1.3% exparel and 40 cc of normal saline was performed for postoperative pain control. The bony surfaces were irrigated with a pulse lavage and then dried. The final components sized above were malleted into place. The stability of the construct was re-evaluated throughout a range of motion and found to be acceptable. The trial liner was removed, the knee was copiously  irrigated, and the knee was re-evaluated for any excess bone debris. The real  polyethylene liner, 9 mm thick, was inserted and checked to ensure the locking mechanism had engaged appropriately. The tourniquet was deflated and hemostasis was achieved. The wound was irrigated with normal saline. A drain was not placed. Capsular closure was performed with a #1 vicryl, subcutaneous fat closed with a 2.0 vicryl suture, then subcutaneous tissue closed with interrupted 2.0 vicryl suture. The skin was then closed with a 3.0 monocryl. A sterile dressing was applied.   The patient was awakened in the operating room and taken to recovery in stable condition. All sponge, needle, and instrument counts were correct at the end of the case.  Position: supine  Complications: none.  Time Out: performed   Drains/Packing: none  Estimated blood loss: minimal  Returned to Recovery Room: in good condition.   Antibiotics: yes   Mechanical VTE (DVT) Prophylaxis: sequential compression devices, TED thigh-high  Chemical VTE (DVT) Prophylaxis: aspirin  Fluid Replacement  Crystalloid: see anesthesia record Blood: none  FFP: none   Specimens Removed: 1 to pathology   Sponge and Instrument Count Correct? yes   PACU: portable radiograph - knee AP and Lateral   Admission: inpatient status  Plan/RTC: Return in 2 weeks for wound check.   Weight Bearing/Load Lower Extremity: full   N. Glee ArvinMichael Xu, MD [facility] [phone] [phone] 2:33 PM

## 2017-05-07 NOTE — Telephone Encounter (Signed)
04/27/2017 OV NOTE FAXED TO THE [facility] [phone]

## 2017-05-07 NOTE — Anesthesia Preprocedure Evaluation (Signed)
Anesthesia Evaluation  Patient identified by MRN, date of birth, ID band Patient awake    Reviewed: Allergy & Precautions, NPO status , Patient's Chart, lab work & pertinent test results  Airway Mallampati: II  TM Distance: >3 FB Neck ROM: Full    Dental no notable dental hx.    Pulmonary Current Smoker,    Pulmonary exam normal breath sounds clear to auscultation       Cardiovascular hypertension, Pt. on medications Normal cardiovascular exam Rhythm:Regular Rate:Normal     Neuro/Psych negative neurological ROS  negative psych ROS   GI/Hepatic Neg liver ROS, GERD  Medicated and Controlled,  Endo/Other  negative endocrine ROS  Renal/GU negative Renal ROS  negative genitourinary   Musculoskeletal negative musculoskeletal ROS (+)   Abdominal   Peds negative pediatric ROS (+)  Hematology negative hematology ROS (+)   Anesthesia Other Findings   Reproductive/Obstetrics negative OB ROS                             Anesthesia Physical Anesthesia Plan  ASA: II  Anesthesia Plan: Spinal   Post-op Pain Management:  Regional for Post-op pain   Induction: Intravenous  PONV Risk Score and Plan: 0 and Ondansetron and Treatment may vary due to age or medical condition  Airway Management Planned: Simple Face Mask  Additional Equipment:   Intra-op Plan:   Post-operative Plan:   Informed Consent: I have reviewed the patients History and Physical, chart, labs and discussed the procedure including the risks, benefits and alternatives for the proposed anesthesia with the patient or authorized representative who has indicated his/her understanding and acceptance.   Dental advisory given  Plan Discussed with: CRNA  Anesthesia Plan Comments: (adductor)        Anesthesia Quick Evaluation

## 2017-05-07 NOTE — Transfer of Care (Signed)
Immediate Anesthesia Transfer of Care Note  Patient: Justin Nolan  Procedure(s) Performed: RIGHT TOTAL KNEE ARTHROPLASTY (Right Knee)  Patient Location: PACU  Anesthesia Type:Spinal  Level of Consciousness: awake, alert , oriented and patient cooperative  Airway & Oxygen Therapy: Patient Spontanous Breathing  Post-op Assessment: Report given to RN and Post -op Vital signs reviewed and stable  Post vital signs: Reviewed and stable  Last Vitals:  Vitals:   05/07/17 1155 05/07/17 1510  BP: (!) 150/69 110/77  Pulse: 78 75  Resp: 15 18  Temp:  36.6 C  SpO2: 97% 95%    Last Pain:  Vitals:   05/07/17 1106  PainSc: 10-Worst pain ever      Patients Stated Pain Goal: 0 (05/07/17 1106)  Complications: No apparent anesthesia complications

## 2017-05-07 NOTE — Progress Notes (Signed)
Orthopedic Tech Progress Note Patient Details:  Justin Nolan M Valverde 1961/02/12 [id number]  CPM Right Knee CPM Right Knee: On Right Knee Flexion (Degrees): 90 Right Knee Extension (Degrees): 0   Jennye MoccasinHughes, Halea Lieb Craig 05/07/2017, 3:51 PM

## 2017-05-07 NOTE — Anesthesia Procedure Notes (Signed)
Anesthesia Regional Block: Adductor canal block   Pre-Anesthetic Checklist: ,, timeout performed, Correct Patient, Correct Site, Correct Laterality, Correct Procedure, Correct Position, site marked, Risks and benefits discussed,  Surgical consent,  Pre-op evaluation,  At surgeon's request and post-op pain management  Laterality: Right and Lower  Prep: Maximum Sterile Barrier Precautions used, chloraprep       Needles:  Injection technique: Single-shot  Needle Type: Echogenic Stimulator Needle     Needle Length: 10cm      Additional Needles:   Procedures:,,,, ultrasound used (permanent image in chart),,,,  Narrative:  Start time: 05/07/2017 11:42 AM End time: 05/07/2017 11:52 AM Injection made incrementally with aspirations every 5 mL.  Performed by: Personally  Anesthesiologist: Phillips GroutARIGNAN, Okie Jansson  Additional Notes: Risks, benefits and alternative to block explained extensively.  Patient tolerated procedure well, without complications.

## 2017-05-07 NOTE — H&P (Signed)
PREOPERATIVE H&P  Chief Complaint: right knee degenerative joint disease  HPI: Justin Nolan is a 56 y.o. male who presents for surgical treatment of right knee degenerative joint disease.  He denies any changes in medical history.  Past Medical History:  Diagnosis Date  . Arthritis   . BPH (benign prostatic hyperplasia)   . GERD (gastroesophageal reflux disease)   . Hypertension   . Pneumonia 02/09/2012   Past Surgical History:  Procedure Laterality Date  . KNEE SURGERY     Social History   Social History  . Marital status: Married    Spouse name: N/A  . Number of children: N/A  . Years of education: N/A   Social History Main Topics  . Smoking status: Current Every Day Smoker    Packs/day: 0.25    Years: 25.00    Types: Cigarettes  . Smokeless tobacco: Never Used  . Alcohol use 1.8 oz/week    3 Shots of liquor per week     Comment: drink occasional  . Drug use: No  . Sexual activity: Yes   Other Topics Concern  . 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     Family History  Problem Relation Age of Onset  . Diabetes Maternal Aunt   . Diabetes Paternal Aunt    No Known Allergies Prior to Admission medications   Medication Sig Start Date End Date Taking? Authorizing Provider  amLODipine (NORVASC) 5 MG tablet Take 1 tablet (5 mg total) by mouth daily. Patient taking differently: Take 10 mg by mouth daily.  09/24/15  Yes Elvina SidleLauenstein, Kurt, MD  Ascorbic Acid (VITAMIN C PO) Take 1 tablet by mouth daily.   Yes [provider]  aspirin EC 81 MG tablet Take 81 mg by mouth daily.   Yes [provider]  chlorthalidone (HYGROTON) 25 MG tablet Take 12.5 mg by mouth daily.   Yes [provider]  Cyanocobalamin (VITAMIN B-12 PO) Take 1 tablet by mouth daily.   Yes [provider]  Flaxseed, Linseed, (FLAX SEED OIL) 1000 MG CAPS Take 1,000 mg by mouth daily.   Yes [provider]  Lysine 1000 MG TABS Take 1,000 mg by mouth daily.   Yes [provider]  Menaquinone-7 (VITAMIN K2 PO) Take 1 tablet by mouth daily.   Yes [provider]  Methylsulfonylmethane (MSM) 1000 MG CAPS Take 1,000 mg by mouth daily.   Yes [provider]  Multiple Vitamin (MULTIVITAMIN WITH MINERALS) TABS Take 1 tablet by mouth daily. 02/12/12  Yes Sara ChuKennerly, Solianny D, MD  pantoprazole (PROTONIX) 40 MG tablet TAKE ONE TABLET BY MOUTH ONCE DAILY Patient taking differently: Take 40 mg by mouth daily.  07/19/15  Yes Wallis BambergMani, Mario, PA-C  tamsulosin (FLOMAX) 0.4 MG CAPS capsule TAKE ONE CAPSULE BY MOUTH ONCE DAILY Patient taking differently: TAKE 0.4 MG BY MOUTH ONCE DAILY 02/22/16  Yes Elvina SidleLauenstein, Kurt, MD  sucralfate (CARAFATE) 1 G tablet Take 1 tablet (1 g total) by mouth 4 (four) times daily -  with meals and at bedtime. Patient not taking: Reported on 07/19/2015 11/29/14   Copland, Gwenlyn FoundJessica C, MD     Positive ROS: All other systems have been reviewed and were otherwise negative with the exception of those mentioned in the HPI and as above.  Physical Exam: General: Alert, no acute distress Cardiovascular: No pedal edema Respiratory: No cyanosis, no use of accessory musculature GI: abdomen  soft Skin: No lesions in the area of chief complaint Neurologic: Sensation intact distally Psychiatric: Patient is competent for consent with normal mood and affect Lymphatic: no lymphedema  MUSCULOSKELETAL: exam stable  Assessment: right knee degenerative joint disease  Plan: Plan for Procedure(s): RIGHT TOTAL KNEE ARTHROPLASTY  The risks benefits and alternatives were discussed with the patient including but not limited to the risks of nonoperative treatment, versus surgical intervention including infection, bleeding, nerve injury,  blood clots, cardiopulmonary complications, morbidity, mortality, among others, and they were willing to proceed.   Glee Arvin,  MD   05/07/2017 12:49 PM

## 2017-05-07 NOTE — Progress Notes (Signed)
PHARMACIST - PHYSICIAN ORDER COMMUNICATION  CONCERNING: P&T Medication Policy on Herbal Medications  DESCRIPTION:  This patient's order for:  Lysine, MSM, and flax seed oil  has been noted.  This product(s) is classified as an "herbal" or natural product. Due to a lack of definitive safety studies or FDA approval, nonstandard manufacturing practices, plus the potential risk of unknown drug-drug interactions while on inpatient medications, the Pharmacy and Therapeutics Committee does not permit the use of "herbal" or natural products of this type within [facility].   ACTION TAKEN: The pharmacy department is unable to verify this order at this time and your patient has been informed of this safety policy. Please reevaluate patient's clinical condition at discharge and address if the herbal or natural product(s) should be resumed at that time.  Girard CooterKimberly Perkins, PharmD Clinical Pharmacist  If after 3:30pm, please call main pharmacy at [phone]

## 2017-05-07 NOTE — Anesthesia Postprocedure Evaluation (Signed)
Anesthesia Post Note  Patient: Justin Nolan  Procedure(s) Performed: RIGHT TOTAL KNEE ARTHROPLASTY (Right Knee)     Patient location during evaluation: PACU Anesthesia Type: Spinal Level of consciousness: awake Pain management: pain level controlled Vital Signs Assessment: post-procedure vital signs reviewed and stable Respiratory status: spontaneous breathing Cardiovascular status: stable Anesthetic complications: no    Last Vitals:  Vitals:   05/07/17 1615 05/07/17 1630  BP: (!) 138/91 140/76  Pulse: (!) 58 (!) 55  Resp: 20 16  Temp:    SpO2: 98% 99%    Last Pain:  Vitals:   05/07/17 1630  PainSc: Asleep                 Katlyn Muldrew

## 2017-05-07 NOTE — Anesthesia Preprocedure Evaluation (Signed)
Anesthesia Evaluation  Patient identified by MRN, date of birth, ID band Patient awake    Reviewed: Allergy & Precautions, NPO status , Patient's Chart, lab work & pertinent test results  Airway Mallampati: II  TM Distance: >3 FB Neck ROM: Full    Dental no notable dental hx.    Pulmonary Current Smoker,    Pulmonary exam normal breath sounds clear to auscultation       Cardiovascular hypertension, Pt. on medications Normal cardiovascular exam Rhythm:Regular Rate:Normal     Neuro/Psych negative neurological ROS  negative psych ROS   GI/Hepatic Neg liver ROS, GERD  Medicated and Controlled,  Endo/Other  negative endocrine ROS  Renal/GU negative Renal ROS  negative genitourinary   Musculoskeletal negative musculoskeletal ROS (+)   Abdominal   Peds negative pediatric ROS (+)  Hematology negative hematology ROS (+)   Anesthesia Other Findings   Reproductive/Obstetrics negative OB ROS                             Anesthesia Physical Anesthesia Plan  ASA: II  Anesthesia Plan: Spinal   Post-op Pain Management:  Regional for Post-op pain   Induction:   PONV Risk Score and Plan: 1 and Ondansetron and Dexamethasone  Airway Management Planned: Simple Face Mask  Additional Equipment:   Intra-op Plan:   Post-operative Plan:   Informed Consent: I have reviewed the patients History and Physical, chart, labs and discussed the procedure including the risks, benefits and alternatives for the proposed anesthesia with the patient or authorized representative who has indicated his/her understanding and acceptance.   Dental advisory given  Plan Discussed with: CRNA  Anesthesia Plan Comments: (Adductor)        Anesthesia Quick Evaluation

## 2017-05-07 NOTE — Anesthesia Procedure Notes (Signed)
Spinal  Patient location during procedure: OR Staffing Anesthesiologist: Makayela Secrest Performed: anesthesiologist  Preanesthetic Checklist Completed: patient identified, site marked, surgical consent, pre-op evaluation, timeout performed, IV checked, risks and benefits discussed and monitors and equipment checked Spinal Block Patient position: sitting Prep: DuraPrep Patient monitoring: heart rate, continuous pulse ox and blood pressure Approach: right paramedian Location: L3-4 Injection technique: single-shot Needle Needle type: Sprotte  Needle gauge: 24 G Needle length: 9 cm Additional Notes Expiration date of kit checked and confirmed. Patient tolerated procedure well, without complications.       

## 2017-05-08 ENCOUNTER — Encounter (HOSPITAL_COMMUNITY): Payer: Self-pay | Admitting: Orthopaedic Surgery

## 2017-05-08 LAB — BASIC METABOLIC PANEL
Anion gap: 10 (ref 5–15)
BUN: 14 mg/dL (ref 6–20)
CHLORIDE: 99 mmol/L — AB (ref 101–111)
CO2: 28 mmol/L (ref 22–32)
CREATININE: 1.16 mg/dL (ref 0.61–1.24)
Calcium: 9 mg/dL (ref 8.9–10.3)
GFR calc non Af Amer: >60 mL/min (ref >60)
GLUCOSE: 143 mg/dL — AB (ref 65–99)
Potassium: 3.7 mmol/L (ref 3.5–5.1)
Sodium: 137 mmol/L (ref 135–145)

## 2017-05-08 LAB — CBC
HEMATOCRIT: 39 % (ref 39.0–52.0)
HEMOGLOBIN: 12.6 g/dL — AB (ref 13.0–17.0)
MCH: 31.6 pg (ref 26.0–34.0)
MCHC: 32.3 g/dL (ref 30.0–36.0)
MCV: 97.7 fL (ref 78.0–100.0)
Platelets: 164 10*3/uL (ref 150–400)
RBC: 3.99 MIL/uL — ABNORMAL LOW (ref 4.22–5.81)
RDW: 12.4 % (ref 11.5–15.5)
WBC: 17.4 10*3/uL — ABNORMAL HIGH (ref 4.0–10.5)

## 2017-05-08 NOTE — Clinical Social Work Note (Signed)
CSW acknowledges SNF consult. PT recommending HHPT.  CSW signing off. Consult again if any other social work needs arise.  Meng Winterton, CSW [phone]  

## 2017-05-08 NOTE — Progress Notes (Signed)
Orthopedic Tech Progress Note Patient Details:  Justin Nolan 02-Jun-1961 [id number]  CPM Right Knee CPM Right Knee: On Right Knee Flexion (Degrees): 70 Right Knee Extension (Degrees): 0   Justin FordyceJennifer C Brittanee Ghazarian 05/08/2017, 6:16 PM

## 2017-05-08 NOTE — Progress Notes (Signed)
   Subjective:  Patient reports pain as moderate.  No events.  Objective:   VITALS:   Vitals:   05/07/17 1630 05/07/17 1709 05/07/17 2100 05/08/17 0500  BP: 140/76 139/81 (!) 142/72 (!) 130/93  Pulse: (!) 55 71 72 74  Resp: 16   16  Temp:  97.8 F (36.6 C) 98.1 F (36.7 C) 98.5 F (36.9 C)  TempSrc:  Oral Oral Oral  SpO2: 99% 99% 99% 99%  Weight:      Height:        Neurologically intact Neurovascular intact Sensation intact distally Intact pulses distally Dorsiflexion/Plantar flexion intact Incision: dressing C/D/I and no drainage   Lab Results  Component Value Date   WBC 17.4 (H) 05/08/2017   HGB 12.6 (L) 05/08/2017   HCT 39.0 05/08/2017   MCV 97.7 05/08/2017   PLT 164 05/08/2017     Assessment/Plan:  1 Day Post-Op   - Expected postop acute blood loss anemia - will monitor for symptoms - Up with PT/OT - DVT ppx - SCDs, ambulation, aspirin - WBAT operative extremity - Pain control - Discharge planning - patient is interested in going home today if possible  Glee ArvinMichael Xu 05/08/2017, 7:37 AM [phone]

## 2017-05-08 NOTE — Care Management Note (Signed)
Case Management Note  Patient Details  Name: Royanne Footsony M Vanderstelt MRN: [id number] Date of Birth: 1960-08-04  Subjective/Objective:                 Spoke w patient at the bedside, [facility] Referral accepted by Davonna BellingMary Yonjoff. He states he needs a RW but not a 3/1, referral placed for RW to be delivered to [facility]. No other CM needs identified at this time.    Action/Plan:   Expected Discharge Date:                  Expected Discharge Plan:  Home w Home Health Services  In-House Referral:     Discharge planning Services  CM Consult  Post Acute Care Choice:  Home Health, Durable Medical Equipment Choice offered to:  Patient  DME Arranged:  Walker rolling DME Agency:  [facility]  HH Arranged:  PT [facility]  Status of Service:  Completed, signed off  If discussed at Long Length of Stay Meetings, dates discussed:    Additional Comments:  Lawerance SabalDebbie Khristina Janota, RN 05/08/2017, 10:56 AM

## 2017-05-08 NOTE — Evaluation (Signed)
Occupational Therapy Evaluation Patient Details Name: Justin Nolan MRN: [id number] DOB: Sep 27, 1960 Today's Date: 05/08/2017    History of Present Illness 56 y.o. male POD#1 s/p R TKA PMH includes: HTN, GERD   Clinical Impression   Pt admitted with the above diagnoses and presents with below problem list. Pt will benefit from continued acute OT to address the below listed deficits and maximize independence with basic ADLs prior to d/c home. PTA pt was independent with ADLs. Pt is currently min guard for functional mobility/transfers and LB ADLs. ADL education provided.       Follow Up Recommendations  No OT follow up    Equipment Recommendations  Other (comment) (declined 3n1 at this time)    Recommendations for Other Services       Precautions / Restrictions Precautions Precautions: Fall;Knee Precaution Booklet Issued: Yes (comment) Precaution Comments: reviewed Restrictions Weight Bearing Restrictions: Yes RLE Weight Bearing: Weight bearing as tolerated      Mobility Bed Mobility Overal bed mobility: Modified Independent             General bed mobility comments: supine<>sit without assistence  Transfers Overall transfer level: Needs assistance Equipment used: Rolling walker (2 wheeled) Transfers: Sit to/from Stand Sit to Stand: Supervision         General transfer comment: Verbal cueing for safe hand placement and use of RW with sit<>stand. Supervision for safety    Balance Overall balance assessment: Modified Independent                                         ADL either performed or assessed with clinical judgement   ADL Overall ADL's : Needs assistance/impaired Eating/Feeding: Set up;Sitting   Grooming: Min guard;Standing   Upper Body Bathing: Set up;Sitting   Lower Body Bathing: Min guard;Minimal assistance;Sit to/from stand   Upper Body Dressing : Set up;Sitting   Lower Body Dressing: Min guard;Minimal assistance;Sit  to/from stand   Toilet Transfer: Min guard;Ambulation;RW   Toileting- Architect and Hygiene: Min guard;Sit to/from stand;Sitting/lateral lean;Set up   Tub/ Shower Transfer: Tub transfer;Minimal assistance;Ambulation;Rolling walker Tub/Shower Transfer Details (indicate cue type and reason): Pt declined 3n1 recommendation. Plans to sponge bathe initially and then sit on edge of tub to get into the tub. Discussed strategies for this.  Functional mobility during ADLs: Min guard;Rolling walker General ADL Comments: Pt completed bed mobility and in-room functional mobility to recliner. ADL education provided.      Vision         Perception     Praxis      Pertinent Vitals/Pain Pain Assessment: Faces Pain Score: 2  Faces Pain Scale: Hurts even more Pain Location: R Knee after mobility Pain Descriptors / Indicators: Aching Pain Intervention(s): Monitored during session;Limited activity within patient's tolerance;Repositioned;Ice applied     Hand Dominance     Extremity/Trunk Assessment Upper Extremity Assessment Upper Extremity Assessment: Overall WFL for tasks assessed   Lower Extremity Assessment Lower Extremity Assessment: Defer to PT evaluation RLE Deficits / Details: Hip flexion and knee extension 3/5 secondary to pain and weakness consistent with surgery.  RLE Sensation:  (intact light touch )       Communication Communication Communication: No difficulties   Cognition Arousal/Alertness: Awake/alert Behavior During Therapy: WFL for tasks assessed/performed Overall Cognitive Status: Within Functional Limits for tasks assessed  General Comments: Eager to increase mobility.    General Comments  Knee flexion: 92 deg. VSS throughout session.     Exercises Exercises: Total Joint Total Joint Exercises Ankle Circles/Pumps: AAROM;Both;20 reps Quad Sets: AROM;Right;10 reps Short Arc Quad: AROM;Right;10 reps Heel  Slides: AROM;Right;10 reps Hip ABduction/ADduction: AROM;Right;10 reps Long Arc Quad: AROM;Both;10 reps Knee Flexion: AROM;Right;10 reps Goniometric ROM: Flexion: 92   Shoulder Instructions      Home Living Family/patient expects to be discharged to:: Private residence Living Arrangements: Spouse/significant other Available Help at Discharge: Family;Friend(s);Available 24 hours/day Type of Home: House Home Access: Stairs to enter Entergy CorporationEntrance Stairs-Number of Steps: 4 Entrance Stairs-Rails: Right;Left Home Layout: One level     Bathroom Shower/Tub: Tub only   FirefighterBathroom Toilet: Handicapped height Bathroom Accessibility: No   Home Equipment: None          Prior Functioning/Environment Level of Independence: Independent        Comments: Mod I with mobility due to pain.        OT Problem List: Impaired balance (sitting and/or standing);Decreased knowledge of use of DME or AE;Decreased knowledge of precautions;Pain      OT Treatment/Interventions: Self-care/ADL training;DME and/or AE instruction;Therapeutic activities;Patient/family education;Balance training    OT Goals(Current goals can be found in the care plan section) Acute Rehab OT Goals Patient Stated Goal: d/c home today to home health PT OT Goal Formulation: With patient Time For Goal Achievement: 05/15/17 Potential to Achieve Goals: Good ADL Goals Pt Will Perform Lower Body Bathing: with modified independence;sit to/from stand Pt Will Perform Lower Body Dressing: with modified independence;sit to/from stand Pt Will Perform Tub/Shower Transfer: with supervision;ambulating;rolling walker  OT Frequency: Min 2X/week   Barriers to D/C:            Co-evaluation              AM-PAC PT "6 Clicks" Daily Activity     Outcome Measure Help from another person eating meals?: None Help from another person taking care of personal grooming?: A Little Help from another person toileting, which includes using  toliet, bedpan, or urinal?: A Little Help from another person bathing (including washing, rinsing, drying)?: A Little Help from another person to put on and taking off regular upper body clothing?: None Help from another person to put on and taking off regular lower body clothing?: A Little 6 Click Score: 20   End of Session Equipment Utilized During Treatment: Rolling walker CPM Right Knee CPM Right Knee: Off  Activity Tolerance: Patient tolerated treatment well Patient left: in chair;with call bell/phone within reach  OT Visit Diagnosis: Unsteadiness on feet (R26.81);Pain Pain - Right/Left: Right Pain - part of body: Knee                Time: 1213-1231 OT Time Calculation (min): 18 min Charges:  OT General Charges $OT Visit: 1 Visit OT Evaluation $OT Eval Low Complexity: 1 Low G-Codes:       Justin Nolan, Kendre Sires H 05/08/2017, 12:43 PM

## 2017-05-08 NOTE — Progress Notes (Signed)
Physical Therapy Treatment Patient Details Name: Justin Nolan MRN: [id number] DOB: 1961/06/01 Today's Date: 05/08/2017    History of Present Illness 56 y.o. male POD#1 s/p R TKA PMH includes: HTN, GERD    PT Comments    PM session focused on reinforcing stair training and therex. Pt ascended/descnded 12 stairs x 2 with BUE support on 1 rail with step to gait, with close supervision. Pt and family educated on safety considerations for home and have no further questions or concerns at this time. Pt has met all functional goals and will benefit from skilled home health PT when medically cleared for d/c.    Follow Up Recommendations  Home health PT;DC plan and follow up therapy as arranged by surgeon     Equipment Recommendations  Rolling walker with 5" wheels    Recommendations for Other Services       Precautions / Restrictions Precautions Precautions: Fall;Knee Precaution Comments: reviewed Restrictions Weight Bearing Restrictions: Yes RLE Weight Bearing: Weight bearing as tolerated    Mobility  Bed Mobility Overal bed mobility: Modified Independent             General bed mobility comments: supine<>sit without assistence  Transfers Overall transfer level: Needs assistance Equipment used: Rolling walker (2 wheeled) Transfers: Sit to/from Stand Sit to Stand: Supervision         General transfer comment: Verbal cueing for safe hand placement and use of RW with sit<>stand. Supervision for safety  Ambulation/Gait Ambulation/Gait assistance: Supervision Ambulation Distance (Feet): 220 Feet Assistive device: Rolling walker (2 wheeled) Gait Pattern/deviations: Step-through pattern;Antalgic Gait velocity: Normal   General Gait Details: Gait velocity normal, step through pattern. Moderate reliance for BUE suport for safety.   Stairs Stairs: Yes   Stair Management: One rail Right;Step to pattern Number of Stairs: 24 General stair comments: Cues for sequencing  and techinque. patient performed 12x2 stairs with BUE support on 1 rail  Wheelchair Mobility    Modified Rankin (Stroke Patients Only)       Balance Overall balance assessment: Modified Independent                                          Cognition Arousal/Alertness: Awake/alert Behavior During Therapy: WFL for tasks assessed/performed Overall Cognitive Status: Within Functional Limits for tasks assessed                                 General Comments: Eager to increase mobility.       Exercises Total Joint Exercises Short Arc Quad: AROM;Right;10 reps Heel Slides: AROM;Right;10 reps Hip ABduction/ADduction: AROM;Right;10 reps Long Arc Quad: AROM;Both;10 reps Knee Flexion: AROM;Right;10 reps Goniometric ROM: 92    General Comments General comments (skin integrity, edema, etc.): Reinforced thereex and stair sequencing      Pertinent Vitals/Pain Pain Assessment: 0-10 Pain Score: 2  Faces Pain Scale: Hurts even more Pain Location: R Knee after mobility Pain Descriptors / Indicators: Aching Pain Intervention(s): Limited activity within patient's tolerance;Monitored during session;Repositioned    Home Living Family/patient expects to be discharged to:: Private residence Living Arrangements: Spouse/significant other Available Help at Discharge: Family;Friend(s);Available 24 hours/day Type of Home: House Home Access: Stairs to enter Entrance Stairs-Rails: Right;Left Home Layout: One level Home Equipment: None      Prior Function Level of Independence: Independent  Comments: Mod I with mobility due to pain.   PT Goals (current goals can now be found in the care plan section) Acute Rehab PT Goals Patient Stated Goal: d/c home today to home health PT PT Goal Formulation: With patient Time For Goal Achievement: 05/13/17 Potential to Achieve Goals: Good Progress towards PT goals: Progressing toward goals    Frequency     7X/week      PT Plan Current plan remains appropriate    Co-evaluation              AM-PAC PT "6 Clicks" Daily Activity  Outcome Measure  Difficulty turning over in bed (including adjusting bedclothes, sheets and blankets)?: None Difficulty moving from lying on back to sitting on the side of the bed? : None Difficulty sitting down on and standing up from a chair with arms (e.g., wheelchair, bedside commode, etc,.)?: None Help needed moving to and from a bed to chair (including a wheelchair)?: None Help needed walking in hospital room?: A Little Help needed climbing 3-5 steps with a railing? : A Little 6 Click Score: 22    End of Session Equipment Utilized During Treatment: Gait belt Activity Tolerance: Patient tolerated treatment well Patient left: in bed;with call bell/phone within reach;with family/visitor present Nurse Communication: Mobility status PT Visit Diagnosis: Pain;Difficulty in walking, not elsewhere classified (R26.2) Pain - Right/Left: Right Pain - part of body: Knee     Time: 1430-1500 PT Time Calculation (min) (ACUTE ONLY): 30 min  Charges:  $Gait Training: 8-22 mins                    G Codes:       Reinaldo Berber, PT, DPT Acute Rehab Services Pager: [place] 05/08/2017, 3:37 PM

## 2017-05-08 NOTE — Discharge Summary (Signed)
Physician Discharge Summary      Patient ID: Justin Nolan Armor MRN: [id number] DOB/AGE: 14-Feb-1961 56 y.o.  Admit date: 05/07/2017 Discharge date: 05/08/2017  Admission Diagnoses:  <principal problem not specified>  Discharge Diagnoses:  Active Problems:   Total knee replacement status   Past Medical History:  Diagnosis Date  . Arthritis   . BPH (benign prostatic hyperplasia)   . GERD (gastroesophageal reflux disease)   . Hypertension   . Pneumonia 02/09/2012    Surgeries: Procedure(s): RIGHT TOTAL KNEE ARTHROPLASTY on 05/07/2017   Consultants (if any):   Discharged Condition: Improved  Hospital Course: Justin Nolan C[place]ow is an 56 y.o. male who was admitted 05/07/2017 with a diagnosis of <principal problem not specified> and went to the operating room on 05/07/2017 and underwent the above named procedures.    He was given perioperative antibiotics:  Anti-infectives    Start     Dose/Rate Route Frequency Ordered Stop   05/07/17 1900  ceFAZolin (ANCEF) IVPB 2g/100 mL premix     2 g 200 mL/hr over 30 Minutes Intravenous Every 6 hours 05/07/17 1703 05/08/17 0101   05/07/17 1200  ceFAZolin (ANCEF) IVPB 2g/100 mL premix     2 g 200 mL/hr over 30 Minutes Intravenous To ShortStay Surgical 05/06/17 0850 05/07/17 1310    .  He was given sequential compression devices, early ambulation, and aspirin for DVT prophylaxis.  He benefited maximally from the hospital stay and there were no complications.    Recent vital signs:  Vitals:   05/08/17 0500 05/08/17 1510  BP: (!) 130/93 (!) 118/53  Pulse: 74 60  Resp: 16 17  Temp: 98.5 F (36.9 C) 98.4 F (36.9 C)  SpO2: 99% 98%    Recent laboratory studies:  Lab Results  Component Value Date   HGB 12.6 (L) 05/08/2017   HGB 13.8 04/30/2017   HGB 14.1 11/29/2014   Lab Results  Component Value Date   WBC 17.4 (H) 05/08/2017   PLT 164 05/08/2017   Lab Results  Component Value Date   INR 0.99 04/30/2017   Lab Results    Component Value Date   NA 137 05/08/2017   K 3.7 05/08/2017   CL 99 (L) 05/08/2017   CO2 28 05/08/2017   BUN 14 05/08/2017   CREATININE 1.16 05/08/2017   GLUCOSE 143 (H) 05/08/2017    Discharge Medications:   Allergies as of 05/08/2017   No Known Allergies     Medication List    TAKE these medications   amLODipine 5 MG tablet Commonly known as:  NORVASC Take 1 tablet (5 mg total) by mouth daily. What changed:  how much to take   aspirin EC 325 MG tablet Take 1 tablet (325 mg total) by mouth 2 (two) times daily. What changed:  medication strength  how much to take  when to take this   chlorthalidone 25 MG tablet Commonly known as:  HYGROTON Take 12.5 mg by mouth daily.   Flax Seed Oil 1000 MG Caps Take 1,000 mg by mouth daily.   Lysine 1000 MG Tabs Take 1,000 mg by mouth daily.   MSM 1000 MG Caps Take 1,000 mg by mouth daily.   multivitamin with minerals Tabs tablet Take 1 tablet by mouth daily.   ondansetron 4 MG tablet Commonly known as:  ZOFRAN Take 1-2 tablets (4-8 mg total) by mouth every 8 (eight) hours as needed for nausea or vomiting.   oxyCODONE 10 mg 12 hr tablet Commonly known as:  OXYCONTIN Take 1 tablet (10 mg total) by mouth every 12 (twelve) hours.   oxyCODONE 5 MG immediate release tablet Commonly known as:  Oxy IR/ROXICODONE Take 1-3 tablets (5-15 mg total) by mouth every 4 (four) hours as needed.   pantoprazole 40 MG tablet Commonly known as:  PROTONIX TAKE ONE TABLET BY MOUTH ONCE DAILY What changed:  how much to take  how to take this  when to take this  additional instructions   promethazine 25 MG tablet Commonly known as:  PHENERGAN Take 1 tablet (25 mg total) by mouth every 6 (six) hours as needed for nausea.   senna-docusate 8.6-50 MG tablet Commonly known as:  SENOKOT S Take 1 tablet by mouth at bedtime as needed.   sucralfate 1 g tablet Commonly known as:  CARAFATE Take 1 tablet (1 g total) by mouth 4  (four) times daily -  with meals and at bedtime.   tamsulosin 0.4 MG Caps capsule Commonly known as:  FLOMAX TAKE ONE CAPSULE BY MOUTH ONCE DAILY What changed:  See the new instructions.   tiZANidine 4 MG tablet Commonly known as:  ZANAFLEX Take 1 tablet (4 mg total) by mouth every 6 (six) hours as needed for muscle spasms.   VITAMIN B-12 PO Take 1 tablet by mouth daily.   VITAMIN C PO Take 1 tablet by mouth daily.   VITAMIN K2 PO Take 1 tablet by mouth daily.            Durable Medical Equipment        Start     Ordered   05/07/17 1704  DME Walker rolling  Once    Question:  Patient needs a walker to treat with the following condition  Answer:  Total knee replacement status   05/07/17 1703   05/07/17 1704  DME 3 n 1  Once     05/07/17 1703   05/07/17 1704  DME Bedside commode  Once    Question:  Patient needs a bedside commode to treat with the following condition  Answer:  Total knee replacement status   05/07/17 1703      Diagnostic Studies: Dg Knee Right Port  Result Date: 05/07/2017 CLINICAL DATA:  Total right knee arthroplasty. EXAM: PORTABLE RIGHT KNEE - 1-2 VIEW COMPARISON:  MRI 09/28/2015 and radiographs 04/27/2017 FINDINGS: The femoral, tibial and patellar components appear well seated. Findings suspicious for small cortical fracture involving the anterior cortex of the distal femur just above the femoral prosthesis. Recommend orthopedic observation. IMPRESSION: 1. Well seated components of a total knee arthroplasty. 2. Suspect small cortical fracture involving the femur just above the femoral prosthesis. These results will be called to the ordering clinician or representative by the Radiologist Assistant, and communication documented in the PACS or zVision Dashboard. Electronically Signed   By: Rudie Meyer Nolan.D.   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  If you develope a fever above 101.5 F, pus (white drainage) or increased drainage or redness at the wound, or calf pain, call your surgeon's office.   Constipation Prevention    Complete by:  As directed  Drink plenty of fluids.  Prune juice may be helpful.  You may use a stool softener, such as Colace (over the counter) 100 mg twice a day.  Use MiraLax (over the counter) for constipation as needed.   Driving restrictions    Complete by:  As directed    No driving while taking narcotic pain meds.   Increase activity slowly as tolerated    Complete by:  As directed       Follow-up Information    Tarry Kos, MD In 2 weeks.   Specialty:  Orthopedic Surgery Why:  For suture removal, For wound re-check Contact information: [address] [place] [address] [phone]            Signed: Glee Arvin 05/08/2017, 4:15 PM

## 2017-05-08 NOTE — Evaluation (Addendum)
Physical Therapy Evaluation Patient Details Name: Justin Nolan MRN: [id number] DOB: June 24, 1961 Today's Date: 05/08/2017   History of Present Illness  56 y.o. male POD#1 s/p R TKA PMH includes: HTN, GERD  Clinical Impression  Patient is s/p above surgery resulting in functional limitations due to the deficits listed below (see PT Problem List). PTA, pt was mod I with all mobility. Upon eval, pt presents with mod I with bed mobility, supervision with transfers and gait with BUE support in RW for safety. Pt currently reporting minimal pain that did not spike throughout treatment session. AM session focused on introducing therex, gait and stair training. Pt ambulated 200 feet with supervision with step-through gait pattern and symmetrical stride length. Ascended/descneded 4 stairs with BUE support. PM session will reinforce stairs and family education for guarding.  Patient will benefit from skilled PT to increase their independence and safety with mobility to allow discharge to the venue listed below.       Follow Up Recommendations Home health PT;DC plan and follow up therapy as arranged by surgeon    Equipment Recommendations  Rolling walker with 5" wheels    Recommendations for Other Services       Precautions / Restrictions Precautions Precautions: Fall;Knee Precaution Booklet Issued: Yes (comment) Restrictions Weight Bearing Restrictions: Yes RLE Weight Bearing: Weight bearing as tolerated      Mobility  Bed Mobility Overal bed mobility: Modified Independent             General bed mobility comments: supine<>sit without assistence  Transfers Overall transfer level: Needs assistance Equipment used: Rolling walker (2 wheeled) Transfers: Sit to/from Stand Sit to Stand: Supervision         General transfer comment: Verbal cueing for safe hand placement and use of RW with sit<>stand. Supervision for safety  Ambulation/Gait Ambulation/Gait assistance:  Supervision Ambulation Distance (Feet): 200 Feet Assistive device: Rolling walker (2 wheeled) Gait Pattern/deviations: Step-through pattern;Antalgic Gait velocity: Normal   General Gait Details: Gait velocity normal, step through pattern. Moderate reliance for BUE suport for safety.  Stairs Stairs: Yes Stairs assistance: Min guard Stair Management: Two rails Number of Stairs: 4 General stair comments: Cues for sequencing and techinque. Pt step-to pattern 2 stairs x 2 with BUE support on rails.  Wheelchair Mobility    Modified Rankin (Stroke Patients Only)       Balance Overall balance assessment: Modified Independent                                           Pertinent Vitals/Pain Pain Assessment: 0-10 Pain Score: 2  Pain Location: R Knee Pain Descriptors / Indicators: Aching Pain Intervention(s): Limited activity within patient's tolerance;Monitored during session;Repositioned;Ice applied    Home Living Family/patient expects to be discharged to:: Private residence Living Arrangements: Spouse/significant other Available Help at Discharge: Family;Friend(s);Available 24 hours/day Type of Home: House Home Access: Stairs to enter Entrance Stairs-Rails: Doctor, general practice of Steps: 4 Home Layout: One level Home Equipment: None      Prior Function Level of Independence: Independent         Comments: Mod I with mobility due to pain.     Hand Dominance        Extremity/Trunk Assessment        Lower Extremity Assessment Lower Extremity Assessment: RLE deficits/detail (LLE= WNL strength globally) RLE Deficits / Details: Hip flexion and knee extension 3/5  secondary to pain and weakness consistent with surgery.  RLE Sensation:  (intact light touch )       Communication   Communication: No difficulties  Cognition Arousal/Alertness: Awake/alert Behavior During Therapy: WFL for tasks assessed/performed Overall Cognitive  Status: Within Functional Limits for tasks assessed                                 General Comments: Eager to increase mobility.       General Comments General comments (skin integrity, edema, etc.): Knee flexion: 92 deg. VSS throughout session.     Exercises Total Joint Exercises Ankle Circles/Pumps: AAROM;Both;20 reps Quad Sets: AROM;Right;10 reps Short Arc Quad: AROM;Right;10 reps Heel Slides: AROM;Right;10 reps Hip ABduction/ADduction: AROM;Right;10 reps Long Arc Quad: AROM;Both;10 reps Knee Flexion: AROM;Right;10 reps Goniometric ROM: Flexion: 92   Assessment/Plan    PT Assessment    PT Problem List         PT Treatment Interventions      PT Goals (Current goals can be found in the Care Plan section)  Acute Rehab PT Goals Patient Stated Goal: d/c home today to home health PT PT Goal Formulation: With patient Time For Goal Achievement: 05/13/17 Potential to Achieve Goals: Good    Frequency     Barriers to discharge        Co-evaluation               AM-PAC PT "6 Clicks" Daily Activity  Outcome Measure Difficulty turning over in bed (including adjusting bedclothes, sheets and blankets)?: None Difficulty moving from lying on back to sitting on the side of the bed? : None Difficulty sitting down on and standing up from a chair with arms (e.g., wheelchair, bedside commode, etc,.)?: A Little Help needed moving to and from a bed to chair (including a wheelchair)?: A Little Help needed walking in hospital room?: A Little Help needed climbing 3-5 steps with a railing? : A Little 6 Click Score: 20    End of Session Equipment Utilized During Treatment: Gait belt Activity Tolerance: Patient tolerated treatment well Patient left: in bed;with call bell/phone within reach Nurse Communication: Mobility status      Time: 0810-0855 PT Time Calculation (min) (ACUTE ONLY): 45 min   Charges:     PT Treatments $Gait Training: 8-22  mins $Therapeutic Exercise: 8-22 mins   PT G Codes:        Etta GrandchildSean Carrell [place]matier, PT, DPT Acute Rehab Services Pager: [phone]   Etta GrandchildSean  Karon Cotterill 05/08/2017, 9:06 AM

## 2017-05-08 NOTE — Progress Notes (Signed)
OT Cancellation Note  Patient Details Name: Justin Nolan MRN: [id number] DOB: 09-13-1960   Cancelled Treatment:    Reason Eval/Treat Not Completed: Other (comment). Pt asleep in bed upon OT arrival. Pt reports he just had pain medication and was planning to rest for a little while. Pt requests OT come back later. Will reattempt as schedule permits.  Raynald KempKathryn Kratos Ruscitti OTR/L Pager: [phone]   05/08/2017, 10:46 AM

## 2017-05-11 ENCOUNTER — Encounter (INDEPENDENT_AMBULATORY_CARE_PROVIDER_SITE_OTHER): Payer: Self-pay

## 2017-05-11 ENCOUNTER — Telehealth (INDEPENDENT_AMBULATORY_CARE_PROVIDER_SITE_OTHER): Payer: Self-pay | Admitting: Orthopaedic Surgery

## 2017-05-11 ENCOUNTER — Ambulatory Visit (INDEPENDENT_AMBULATORY_CARE_PROVIDER_SITE_OTHER): Payer: No Typology Code available for payment source | Admitting: Orthopaedic Surgery

## 2017-05-11 NOTE — Telephone Encounter (Signed)
yes

## 2017-05-11 NOTE — Telephone Encounter (Signed)
[facility] asking for verbal orders on 1 week 1 and 3 week 2 for home health PT. CB # [phone]

## 2017-05-11 NOTE — Telephone Encounter (Signed)
Is this okay?

## 2017-05-11 NOTE — Telephone Encounter (Signed)
Called to advise on orders ok per Dr Roda ShuttersXu

## 2017-05-14 ENCOUNTER — Telehealth (INDEPENDENT_AMBULATORY_CARE_PROVIDER_SITE_OTHER): Payer: Self-pay | Admitting: Orthopaedic Surgery

## 2017-05-14 NOTE — Telephone Encounter (Signed)
Refill #30 oxycodone.  1-2 tabs po bid prn pain

## 2017-05-14 NOTE — Telephone Encounter (Signed)
Please advise on Rf's

## 2017-05-14 NOTE — Telephone Encounter (Signed)
Patient called needing Rx refilled (Oxycontin and Oxycodone) Patient asked if the Rx can be called into [facility] to the pharmacy. The number to contact patient is [phone]

## 2017-05-15 MED ORDER — OXYCODONE-ACETAMINOPHEN 5-325 MG PO TABS: ORAL_TABLET | ORAL | 0 refills | Status: DC

## 2017-05-15 NOTE — Telephone Encounter (Signed)
Rx printed

## 2017-05-15 NOTE — Telephone Encounter (Signed)
Pending Signature

## 2017-05-15 NOTE — Telephone Encounter (Signed)
Called pt Rx is ready for  Pick up here in our office.  Patient aware and wanted me to fax Rx into pharm but we cannot do that he will have to come pick up script here in  Our office.

## 2017-05-22 ENCOUNTER — Ambulatory Visit (INDEPENDENT_AMBULATORY_CARE_PROVIDER_SITE_OTHER): Payer: Non-veteran care

## 2017-05-22 ENCOUNTER — Ambulatory Visit (INDEPENDENT_AMBULATORY_CARE_PROVIDER_SITE_OTHER): Payer: Non-veteran care | Admitting: Orthopaedic Surgery

## 2017-05-22 ENCOUNTER — Encounter (INDEPENDENT_AMBULATORY_CARE_PROVIDER_SITE_OTHER): Payer: Self-pay | Admitting: Orthopaedic Surgery

## 2017-05-22 DIAGNOSIS — Z96651 Presence of right artificial knee joint: Secondary | ICD-10-CM

## 2017-05-22 MED ORDER — OXYCODONE-ACETAMINOPHEN 5-325 MG PO TABS: 1.0000 | ORAL_TABLET | Freq: Three times a day (TID) | ORAL | 0 refills | Status: DC | PRN

## 2017-05-22 NOTE — Progress Notes (Signed)
Justin Nolan is 2-week status post right total knee replacement.  He is overall doing well and progressing with home health physical therapy.  He ambulates with a walker today.  Is requesting a refill on his pain medicines.  Overall he does not have any significant complaints.  Surgical incision is healed without any signs of infection.  He does have persistent swelling and effusion.  There is no evidence of infection.  X-rays show stable total knee replacement in good alignment.  There is no evidence of propagation of the lucency of the supracondylar femur.  Outpatient physical therapy referral was made today.  Percocet was refilled.  Patient states that he was not taking his aspirin which I told him he needs to take to prevent blood clots.  Follow-up in 4 weeks with 2 view x-rays of the right knee.

## 2017-05-26 ENCOUNTER — Telehealth (INDEPENDENT_AMBULATORY_CARE_PROVIDER_SITE_OTHER): Payer: Self-pay | Admitting: Orthopaedic Surgery

## 2017-05-26 NOTE — Telephone Encounter (Signed)
Patient called advised he was referred for (PT) and was told he needed to get [place] the [place] in order to start his (PT) Patient want to know if the auth. On file for his surgery can get him authorized for (PT) Patient said the auth need to be faxed over to [facility] (PT). The number to contact patient is  [phone]

## 2017-05-26 NOTE — Telephone Encounter (Signed)
Do you know anything about this? VA patient

## 2017-06-19 ENCOUNTER — Ambulatory Visit (INDEPENDENT_AMBULATORY_CARE_PROVIDER_SITE_OTHER): Payer: Non-veteran care

## 2017-06-19 ENCOUNTER — Ambulatory Visit (INDEPENDENT_AMBULATORY_CARE_PROVIDER_SITE_OTHER): Payer: Non-veteran care | Admitting: Orthopaedic Surgery

## 2017-06-19 DIAGNOSIS — Z96651 Presence of right artificial knee joint: Secondary | ICD-10-CM

## 2017-06-19 MED ORDER — TIZANIDINE HCL 4 MG PO TABS: 4.0000 mg | ORAL_TABLET | Freq: Four times a day (QID) | ORAL | 2 refills | Status: DC | PRN

## 2017-06-19 MED ORDER — OXYCODONE-ACETAMINOPHEN 5-325 MG PO TABS: 1.0000 | ORAL_TABLET | Freq: Every day | ORAL | 0 refills | Status: DC | PRN

## 2017-06-19 NOTE — Progress Notes (Signed)
Patient is 6 weeks status post right total knee replacement.  He has yet to start outpatient physical therapy due to pending [facility] approval which was made 4 weeks ago.  Patient has been doing home exercises instead.  His pain is overall well controlled with pain medicines.  He is improving steadily.  His surgical scar is fully healed.  His range of motion is 0-95 degrees.  He has mild swelling.  X-rays show stable alignment of the right total knee replacement.  We will check on the [place] authorization for outpatient physical therapy which has significantly delayed his improvement and progress.  Percocet and Zanaflex were refilled today.  Follow-up in 6 weeks for recheck.  He will need 2 view x-rays of the right knee on return.  He may discontinue aspirin at this point.

## 2017-07-27 ENCOUNTER — Telehealth (INDEPENDENT_AMBULATORY_CARE_PROVIDER_SITE_OTHER): Payer: Self-pay | Admitting: Orthopaedic Surgery

## 2017-07-27 NOTE — Telephone Encounter (Signed)
Called patient no answer LMOM to return call. I Did new Rx for Physical therapy I can either give him the new script for PT or I can fax it to where ever he would like to go.

## 2017-07-27 NOTE — Telephone Encounter (Signed)
Pt needs new prescription for Physical Therapy. Pt had trouble getting to physical therapy will need new scrip.

## 2017-07-27 NOTE — Telephone Encounter (Signed)
Rx ready for pick up at the front desk

## 2017-07-30 NOTE — Telephone Encounter (Signed)
Patient was actually needin a RX refill on his pain medication before therapy.Justin Nolan. He is requesting Zanaflex and Percocet. CB [phone]

## 2017-07-30 NOTE — Telephone Encounter (Signed)
Please advise 

## 2017-07-30 NOTE — Telephone Encounter (Signed)
Norco 5. 1-2 tabs po daily prn pain #20.  #30 of zanaflex 5 mg.  1 tab tid prn

## 2017-07-31 ENCOUNTER — Other Ambulatory Visit (INDEPENDENT_AMBULATORY_CARE_PROVIDER_SITE_OTHER): Payer: Self-pay

## 2017-07-31 MED ORDER — TIZANIDINE HCL 4 MG PO TABS: ORAL_TABLET | ORAL | 2 refills | Status: DC

## 2017-07-31 MED ORDER — HYDROCODONE-ACETAMINOPHEN 5-325 MG PO TABS: ORAL_TABLET | ORAL | 0 refills | Status: DC

## 2017-07-31 NOTE — Telephone Encounter (Signed)
Can you do this RX?  thanks

## 2017-07-31 NOTE — Progress Notes (Signed)
Made in error

## 2017-07-31 NOTE — Telephone Encounter (Signed)
Rx ready for pick up at the front desk. Called patient to advise

## 2018-01-07 ENCOUNTER — Ambulatory Visit (INDEPENDENT_AMBULATORY_CARE_PROVIDER_SITE_OTHER): Payer: Self-pay

## 2018-01-07 ENCOUNTER — Ambulatory Visit (INDEPENDENT_AMBULATORY_CARE_PROVIDER_SITE_OTHER): Payer: PRIVATE HEALTH INSURANCE | Admitting: Orthopaedic Surgery

## 2018-01-07 DIAGNOSIS — M25561 Pain in right knee: Secondary | ICD-10-CM | POA: Diagnosis not present

## 2018-01-07 DIAGNOSIS — G8929 Other chronic pain: Secondary | ICD-10-CM | POA: Diagnosis not present

## 2018-01-07 DIAGNOSIS — Z96651 Presence of right artificial knee joint: Secondary | ICD-10-CM | POA: Diagnosis not present

## 2018-01-07 NOTE — Progress Notes (Signed)
Office Visit Note   Patient: Justin Nolan           Date of Birth: 01-Aug-1960           MRN: [id number] Visit Date: 01/07/2018              Requested by: No referring provider defined for this encounter. PCP: Patient, No Pcp Per   Assessment & Plan: Visit Diagnoses:  1. Status post total right knee replacement   2. Chronic pain of right knee     Plan: Patient is 8 months status post uncemented right total knee replacement.  Overall he is doing well with a recent flareup last month.  No concern for infection.  Dental prophylaxis was reinforced today.  He states that he has been having more pain with his left knee because he has had to compensate for his right knee from his recent knee replacement.  I will see him back in 4 months for his one-year checkup with 2 view x-rays of the right knee. Total face to face encounter time was greater than 25 minutes and over half of this time was spent in counseling and/or coordination of care.  Follow-Up Instructions: Return in about 4 months (around 05/09/2018).   Orders:  Orders Placed This Encounter  Procedures  . XR KNEE 3 VIEW RIGHT   No orders of the defined types were placed in this encounter.     Procedures: No procedures performed   Clinical Data: No additional findings.   Subjective: Chief Complaint  Patient presents with  . Right Knee - Pain    S/p right TKA 05/07/17    Guy status post right is 8 months uncemented knee replacement.  He is overall doing well.  He said last month it flared up and swelled up on him but this has resolved.  He denies any constitutional symptoms.  Overall he has been doing well in his postoperative recovery.   Review of Systems   Objective: Vital Signs: There were no vitals taken for this visit.  Physical Exam  Ortho Exam Right knee exam shows a fully healed surgical scar.  He has a small joint effusion.  No evidence of infection.  Good range of motion. Specialty Comments:  No  specialty comments available.  Imaging: Xr Knee 3 View Right  Result Date: 01/07/2018 Stable right total knee replacement without evidence of complication    PMFS History: Patient Active Problem List   Diagnosis Date Noted  . 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 Alcohol abuse 02/16/2012  . HTN (hypertension) 02/09/2012  . GERD (gastroesophageal reflux disease) 02/09/2012  . Tobacco use 02/09/2012  . Indirect hyperbilirubinemia 02/09/2012   Past Medical History:  Diagnosis Date  . Arthritis   . BPH (benign prostatic hyperplasia)   . GERD (gastroesophageal reflux disease)   . Hypertension   . Pneumonia 02/09/2012    Family History  Problem Relation Age of Onset  . Diabetes Maternal Aunt   . Diabetes Paternal Aunt     Past Surgical History:  Procedure Laterality Date  . KNEE SURGERY    . TOTAL KNEE ARTHROPLASTY Right 05/07/2017   Procedure: RIGHT TOTAL KNEE ARTHROPLASTY;  Surgeon: Tarry Kos, MD;  Location: [facility];  Service: Orthopedics;  Laterality: Right;   Social History   Occupational History  . Not on file  Tobacco Use  . Smoking status: Current Every Day Smoker    Packs/day: 0.25    Years: 25.00    Pack  years: 6.25    Types: Cigarettes  . Smokeless tobacco: Never Used  Substance and Sexual Activity  . Alcohol use: Yes    Alcohol/week: 1.8 oz    Types: 3 Shots of liquor per week    Comment: drink occasional  . Drug use: No  . Sexual activity: Yes

## 2018-01-26 ENCOUNTER — Telehealth (INDEPENDENT_AMBULATORY_CARE_PROVIDER_SITE_OTHER): Payer: Self-pay | Admitting: Orthopaedic Surgery

## 2018-01-26 NOTE — Telephone Encounter (Signed)
Returned call to patient left message for return call  [phone]

## 2018-02-11 ENCOUNTER — Encounter (INDEPENDENT_AMBULATORY_CARE_PROVIDER_SITE_OTHER): Payer: Self-pay | Admitting: Orthopaedic Surgery

## 2018-02-11 ENCOUNTER — Ambulatory Visit (INDEPENDENT_AMBULATORY_CARE_PROVIDER_SITE_OTHER): Payer: PRIVATE HEALTH INSURANCE

## 2018-02-11 ENCOUNTER — Ambulatory Visit (INDEPENDENT_AMBULATORY_CARE_PROVIDER_SITE_OTHER): Payer: PRIVATE HEALTH INSURANCE | Admitting: Orthopaedic Surgery

## 2018-02-11 DIAGNOSIS — M545 Low back pain: Secondary | ICD-10-CM

## 2018-02-11 DIAGNOSIS — G8929 Other chronic pain: Secondary | ICD-10-CM | POA: Diagnosis not present

## 2018-02-11 DIAGNOSIS — Z96651 Presence of right artificial knee joint: Secondary | ICD-10-CM | POA: Diagnosis not present

## 2018-02-11 MED ORDER — PREDNISONE 10 MG (21) PO TBPK: ORAL_TABLET | ORAL | 0 refills | Status: DC

## 2018-02-11 MED ORDER — METHOCARBAMOL 500 MG PO TABS: ORAL_TABLET | ORAL | 0 refills | Status: DC

## 2018-02-11 NOTE — Progress Notes (Signed)
Office Visit Note   Patient: Justin Nolan           Date of Birth: 03/18/1961           MRN: [id number] Visit Date: 02/11/2018              Requested by: No referring provider defined for this encounter. PCP: Patient, No Pcp Per   Assessment & Plan: Visit Diagnoses:  1. Chronic low back pain, unspecified back pain laterality, with sciatica presence unspecified   2. 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  I believe the patient is still getting some pain from his knee replacement as he is not even 1 year out.  I do believe he has a lumbar radiculopathy as well.  We will call in a steroid taper and muscle relaxer as well as send him to formal physical therapy.  He will follow-up with [place] in October when he is 1 year out from surgery.  Follow-Up Instructions: Return in about 2 months (around 04/13/2018).   Orders:  Orders Placed This Encounter  Procedures  . XR Lumbar Spine 2-3 Views   Meds ordered this encounter  Medications  . predniSONE (STERAPRED UNI-PAK 21 TAB) 10 MG (21) TBPK tablet    Sig: Take as directed    Dispense:  21 tablet    Refill:  0  . methocarbamol (ROBAXIN) 500 MG tablet    Sig: Take one tab po bid prn muscle spasm    Dispense:  30 tablet    Refill:  0      Procedures: No procedures performed   Clinical Data: No additional findings.   Subjective: Chief Complaint  Patient presents with  . Right Knee - Pain    HPI patient is a pleasant 57 year old gentleman who presents to our clinic today with continued right knee pain.  Status post right total knee replacement, date of surgery 05/07/2017.  He has had intermittent continued pain since surgery.  His pain is primarily located to the lateral aspect of the knee.  He does admit to pain to the posterior aspect as well.  Pain is worse when trying to lift his entire right leg.  He has taken over-the-counter medication as  well as and wearing a knee sleeve with minimal improvement of symptoms.  He denies any numbness, tingling or burning but does note lower back pain.  No groin  or anterior thigh pain.  No bowel or bladder incontinence and no saddle paresthesias.  No previous epidural steroid injection.  Review of Systems as detailed in HPI.  All others reviewed and are negative.   Objective: Vital Signs: There were no vitals taken for this visit.  Physical Exam well-developed and well-nourished gentleman in no acute distress.  Alert and oriented x3.  Ortho Exam examination of the right lower extremity shows range of motion of the knee from 0 to 100 degrees.  He is stable valgus varus stress.  He does have a trace effusion.  No patella tracking.  Calf is soft and nontender.  Negative logroll.  Minimally positive straight leg raise.  Increased pain with lumbar flexion.  No spinous or paraspinous tenderness.  He is neurovascularly intact distally.  Specialty Comments:  No specialty comments available.  Imaging: Xr Lumbar Spine 2-3 Views  Result Date: 02/11/2018 X-rays show decreased joint space L5-S1    PMFS History: Patient Active Problem List   Diagnosis Date Noted  .  Chronic low back pain 02/11/2018  . 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 Alcohol abuse 02/16/2012  . HTN (hypertension) 02/09/2012  . GERD (gastroesophageal reflux disease) 02/09/2012  . Tobacco use 02/09/2012  . Indirect hyperbilirubinemia 02/09/2012   Past Medical History:  Diagnosis Date  . Arthritis   . BPH (benign prostatic hyperplasia)   . GERD (gastroesophageal reflux disease)   . Hypertension   . Pneumonia 02/09/2012    Family History  Problem Relation Age of Onset  . Diabetes Maternal Aunt   . Diabetes Paternal Aunt     Past Surgical History:  Procedure Laterality Date  . KNEE SURGERY    . TOTAL KNEE ARTHROPLASTY Right 05/07/2017   Procedure: RIGHT TOTAL KNEE ARTHROPLASTY;  Surgeon: Tarry KosXu, Kalika Smay M, MD;   Location: [facility];  Service: Orthopedics;  Laterality: Right;   Social History   Occupational History  . Not on file  Tobacco Use  . Smoking status: Current Every Day Smoker    Packs/day: 0.25    Years: 25.00    Pack years: 6.25    Types: Cigarettes  . Smokeless tobacco: Never Used  Substance and Sexual Activity  . Alcohol use: Yes    Alcohol/week: 1.8 oz    Types: 3 Shots of liquor per week    Comment: drink occasional  . Drug use: No  . Sexual activity: Yes

## 2018-02-15 ENCOUNTER — Telehealth (INDEPENDENT_AMBULATORY_CARE_PROVIDER_SITE_OTHER): Payer: Self-pay | Admitting: Orthopaedic Surgery

## 2018-02-15 NOTE — Telephone Encounter (Signed)
See message below °

## 2018-02-15 NOTE — Telephone Encounter (Signed)
Can you do this

## 2018-02-16 ENCOUNTER — Telehealth (INDEPENDENT_AMBULATORY_CARE_PROVIDER_SITE_OTHER): Payer: Self-pay | Admitting: Physical Medicine and Rehabilitation

## 2018-02-16 NOTE — Telephone Encounter (Signed)
Patient came into the office and wanted to schedule an injection with Dr. Alvester MorinNewton.  He has [facility], so authorization is required.  I do not see a referral from Dr. Roda ShuttersXu for the injection.  The [facility]# is [phone].  Patient's CB#[phone]

## 2018-02-16 NOTE — Telephone Encounter (Signed)
The person you would need to speak to at [facility] is Vickie

## 2018-02-17 NOTE — Telephone Encounter (Signed)
Will fill out paperwork for patient for the [place].

## 2018-02-17 NOTE — Telephone Encounter (Signed)
Sent original form to scan

## 2018-02-17 NOTE — Telephone Encounter (Signed)
Spoke to Dr. Roda ShuttersXu assistant and she will get auth for pt.

## 2018-02-17 NOTE — Telephone Encounter (Signed)
Faxed paper to fax number [phone]

## 2018-02-23 ENCOUNTER — Telehealth (INDEPENDENT_AMBULATORY_CARE_PROVIDER_SITE_OTHER): Payer: Self-pay | Admitting: Orthopaedic Surgery

## 2018-02-23 NOTE — Telephone Encounter (Signed)
[place] [facility] [place] [phone]

## 2018-03-02 ENCOUNTER — Telehealth (INDEPENDENT_AMBULATORY_CARE_PROVIDER_SITE_OTHER): Payer: Self-pay | Admitting: *Deleted

## 2018-03-03 NOTE — Telephone Encounter (Signed)
We gave him a referral to PT when he was here

## 2018-03-03 NOTE — Telephone Encounter (Signed)
FYI- Tried calling patient to advise him of message per Dr. Roda ShuttersXu, but no answer and VM is full and I could not leave a message for patient to return call.

## 2018-03-03 NOTE — Telephone Encounter (Signed)
See message below concerning PT. Please advise.  Thank you

## 2018-03-29 ENCOUNTER — Telehealth (INDEPENDENT_AMBULATORY_CARE_PROVIDER_SITE_OTHER): Payer: Self-pay | Admitting: Orthopaedic Surgery

## 2018-03-29 NOTE — Telephone Encounter (Signed)
Patient called asked if Dr Roda ShuttersXu completed the paperwork for the [facility]?  The number to contact patient is [phone]

## 2018-03-31 NOTE — Telephone Encounter (Signed)
Dr Roda ShuttersXu filled out paper work. Patient will pick up

## 2018-04-02 ENCOUNTER — Ambulatory Visit (INDEPENDENT_AMBULATORY_CARE_PROVIDER_SITE_OTHER): Payer: No Typology Code available for payment source | Admitting: Orthopaedic Surgery

## 2018-04-12 ENCOUNTER — Telehealth (INDEPENDENT_AMBULATORY_CARE_PROVIDER_SITE_OTHER): Payer: Self-pay | Admitting: Orthopaedic Surgery

## 2018-04-12 NOTE — Telephone Encounter (Signed)
See message below °

## 2018-04-12 NOTE — Telephone Encounter (Signed)
Medication refill   Hydrocodone-acetaminophen   Methocarbamol(Robaxin)500 mg tablet    Patient called needed pain medicine as well as med for muscle spasms. Patient is unaware of mediation he is currently taking.

## 2018-04-13 MED ORDER — HYDROCODONE-ACETAMINOPHEN 5-325 MG PO TABS: ORAL_TABLET | ORAL | 0 refills | Status: DC

## 2018-04-13 MED ORDER — METHOCARBAMOL 500 MG PO TABS: ORAL_TABLET | ORAL | 0 refills | Status: DC

## 2018-04-13 NOTE — Telephone Encounter (Signed)
IC advised could pick up at front desk Patient wanted both printed because he is taking them to [place] to have filled.

## 2018-04-13 NOTE — Telephone Encounter (Signed)
He can have #5 of norco.  Refill robaxin #[phone].

## 2018-07-14 HISTORY — PX: ROTATOR CUFF REPAIR: SHX139

## 2018-10-20 ENCOUNTER — Telehealth (INDEPENDENT_AMBULATORY_CARE_PROVIDER_SITE_OTHER): Payer: Self-pay | Admitting: Orthopaedic Surgery

## 2018-10-20 NOTE — Telephone Encounter (Signed)
I received call from patient wanting to get copy of records for the [place]. I informed need to sign release form. He will come in this morning to sign form.

## 2019-04-25 ENCOUNTER — Emergency Department (HOSPITAL_COMMUNITY)
Admission: EM | Admit: 2019-04-25 | Discharge: 2019-04-25 | Disposition: A | Discharge status: Home / Self Care | Payer: No Typology Code available for payment source | Attending: Emergency Medicine | Admitting: Emergency Medicine

## 2019-04-25 ENCOUNTER — Emergency Department (HOSPITAL_COMMUNITY): Payer: No Typology Code available for payment source

## 2019-04-25 ENCOUNTER — Other Ambulatory Visit: Payer: Self-pay

## 2019-04-25 ENCOUNTER — Encounter (HOSPITAL_COMMUNITY): Payer: Self-pay | Admitting: Emergency Medicine

## 2019-04-25 DIAGNOSIS — S92011B Displaced fracture of body of right calcaneus, initial encounter for open fracture: Secondary | ICD-10-CM | POA: Diagnosis not present

## 2019-04-25 DIAGNOSIS — F17210 Nicotine dependence, cigarettes, uncomplicated: Secondary | ICD-10-CM | POA: Diagnosis not present

## 2019-04-25 DIAGNOSIS — Y9289 Other specified places as the place of occurrence of the external cause: Secondary | ICD-10-CM | POA: Insufficient documentation

## 2019-04-25 DIAGNOSIS — Y999 Unspecified external cause status: Secondary | ICD-10-CM | POA: Diagnosis not present

## 2019-04-25 DIAGNOSIS — Z7982 Long term (current) use of aspirin: Secondary | ICD-10-CM | POA: Insufficient documentation

## 2019-04-25 DIAGNOSIS — Y9389 Activity, other specified: Secondary | ICD-10-CM | POA: Insufficient documentation

## 2019-04-25 DIAGNOSIS — Z79899 Other long term (current) drug therapy: Secondary | ICD-10-CM | POA: Insufficient documentation

## 2019-04-25 DIAGNOSIS — I10 Essential (primary) hypertension: Secondary | ICD-10-CM | POA: Insufficient documentation

## 2019-04-25 DIAGNOSIS — W320XXA Accidental handgun discharge, initial encounter: Secondary | ICD-10-CM | POA: Diagnosis not present

## 2019-04-25 DIAGNOSIS — S92001B Unspecified fracture of right calcaneus, initial encounter for open fracture: Secondary | ICD-10-CM

## 2019-04-25 DIAGNOSIS — W3400XA Accidental discharge from unspecified firearms or gun, initial encounter: Secondary | ICD-10-CM

## 2019-04-25 DIAGNOSIS — S91301A Unspecified open wound, right foot, initial encounter: Secondary | ICD-10-CM | POA: Diagnosis present

## 2019-04-25 LAB — COMPREHENSIVE METABOLIC PANEL
ALT: 22 U/L (ref 0–44)
AST: 25 U/L (ref 15–41)
Albumin: 4.4 g/dL (ref 3.5–5.0)
Alkaline Phosphatase: 45 U/L (ref 38–126)
Anion gap: 20 — ABNORMAL HIGH (ref 5–15)
BUN: 17 mg/dL (ref 6–20)
CO2: 24 mmol/L (ref 22–32)
Calcium: 9.6 mg/dL (ref 8.9–10.3)
Chloride: 99 mmol/L (ref 98–111)
Creatinine, Ser: 1.11 mg/dL (ref 0.61–1.24)
GFR calc Af Amer: >60 mL/min (ref >60)
GFR calc non Af Amer: >60 mL/min (ref >60)
Glucose, Bld: 142 mg/dL — ABNORMAL HIGH (ref 70–99)
Potassium: 3.1 mmol/L — ABNORMAL LOW (ref 3.5–5.1)
Sodium: 143 mmol/L (ref 135–145)
Total Bilirubin: 1.1 mg/dL (ref 0.3–1.2)
Total Protein: 7.1 g/dL (ref 6.5–8.1)

## 2019-04-25 LAB — ETHANOL: Alcohol, Ethyl (B): 115 mg/dL — ABNORMAL HIGH (ref <10)

## 2019-04-25 LAB — CBC WITH DIFFERENTIAL/PLATELET
Abs Immature Granulocytes: 0.04 10*3/uL (ref 0.00–0.07)
Basophils Absolute: 0.1 10*3/uL (ref 0.0–0.1)
Basophils Relative: 1 %
Eosinophils Absolute: 0.1 10*3/uL (ref 0.0–0.5)
Eosinophils Relative: 1 %
HCT: 40.4 % (ref 39.0–52.0)
Hemoglobin: 13.7 g/dL (ref 13.0–17.0)
Immature Granulocytes: 0 %
Lymphocytes Relative: 43 %
Lymphs Abs: 4.2 10*3/uL — ABNORMAL HIGH (ref 0.7–4.0)
MCH: 33.7 pg (ref 26.0–34.0)
MCHC: 33.9 g/dL (ref 30.0–36.0)
MCV: 99.3 fL (ref 80.0–100.0)
Monocytes Absolute: 0.7 10*3/uL (ref 0.1–1.0)
Monocytes Relative: 7 %
Neutro Abs: 4.5 10*3/uL (ref 1.7–7.7)
Neutrophils Relative %: 48 %
Platelets: 200 10*3/uL (ref 150–400)
RBC: 4.07 MIL/uL — ABNORMAL LOW (ref 4.22–5.81)
RDW: 12.3 % (ref 11.5–15.5)
WBC: 9.7 10*3/uL (ref 4.0–10.5)
nRBC: 0 % (ref 0.0–0.2)

## 2019-04-25 LAB — PROTIME-INR
INR: 1 (ref 0.8–1.2)
Prothrombin Time: 13.3 seconds (ref 11.4–15.2)

## 2019-04-25 MED ORDER — IBUPROFEN 400 MG PO TABS
600.0000 mg | ORAL_TABLET | Freq: Once | ORAL | Status: AC
  Administered 2019-04-25: 600 mg via ORAL
  Filled 2019-04-25: qty 1

## 2019-04-25 MED ORDER — OXYCODONE-ACETAMINOPHEN 5-325 MG PO TABS: 1.0000 | ORAL_TABLET | Freq: Four times a day (QID) | ORAL | 0 refills | Status: DC | PRN

## 2019-04-25 MED ORDER — CEPHALEXIN 500 MG PO CAPS: 500.0000 mg | ORAL_CAPSULE | Freq: Three times a day (TID) | ORAL | 0 refills | Status: DC

## 2019-04-25 MED ORDER — CEPHALEXIN 250 MG PO CAPS
500.0000 mg | ORAL_CAPSULE | Freq: Once | ORAL | Status: AC
  Administered 2019-04-25: 500 mg via ORAL
  Filled 2019-04-25: qty 2

## 2019-04-25 MED ORDER — OXYCODONE-ACETAMINOPHEN 5-325 MG PO TABS
1.0000 | ORAL_TABLET | Freq: Once | ORAL | Status: AC
  Administered 2019-04-25: 1 via ORAL
  Filled 2019-04-25: qty 1
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
## 2019-04-25 NOTE — ED Notes (Signed)
Called ortho for short leg splint  ?

## 2019-04-25 NOTE — Progress Notes (Signed)
Orthopedic Tech Progress Note Patient Details:  Justin Nolan 09-03-1960 [id number]  Ortho Devices Type of Ortho Device: Short leg splint Ortho Device/Splint Interventions: Adjustment, Application, Ordered   Post Interventions Patient Tolerated: Well Instructions Provided: Care of device, Adjustment of device   [name] T 04/25/2019, 9:31 PM
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
## 2019-04-25 NOTE — ED Notes (Signed)
Patient verbalizes understanding of discharge instructions. Opportunity for questioning and answers were provided. Armband removed by staff, pt discharged from ED ambulatory to Home.   
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
## 2022-10-02 ENCOUNTER — Encounter (HOSPITAL_COMMUNITY): Payer: Self-pay

## 2022-10-02 ENCOUNTER — Other Ambulatory Visit: Payer: Self-pay

## 2022-10-02 ENCOUNTER — Emergency Department (HOSPITAL_COMMUNITY)
Admission: EM | Admit: 2022-10-02 | Discharge: 2022-10-02 | Disposition: A | Discharge status: Home / Self Care | Payer: No Typology Code available for payment source | Attending: Emergency Medicine | Admitting: Emergency Medicine

## 2022-10-02 DIAGNOSIS — E876 Hypokalemia: Secondary | ICD-10-CM | POA: Diagnosis not present

## 2022-10-02 DIAGNOSIS — Z7982 Long term (current) use of aspirin: Secondary | ICD-10-CM | POA: Insufficient documentation

## 2022-10-02 LAB — CBC WITH DIFFERENTIAL/PLATELET
Abs Immature Granulocytes: 0.03 10*3/uL (ref 0.00–0.07)
Basophils Absolute: 0.1 10*3/uL (ref 0.0–0.1)
Basophils Relative: 1 %
Eosinophils Absolute: 0 10*3/uL (ref 0.0–0.5)
Eosinophils Relative: 0 %
HCT: 40.5 % (ref 39.0–52.0)
Hemoglobin: 13.7 g/dL (ref 13.0–17.0)
Immature Granulocytes: 0 %
Lymphocytes Relative: 30 %
Lymphs Abs: 2.5 10*3/uL (ref 0.7–4.0)
MCH: 33.7 pg (ref 26.0–34.0)
MCHC: 33.8 g/dL (ref 30.0–36.0)
MCV: 99.5 fL (ref 80.0–100.0)
Monocytes Absolute: 0.7 10*3/uL (ref 0.1–1.0)
Monocytes Relative: 9 %
Neutro Abs: 5.1 10*3/uL (ref 1.7–7.7)
Neutrophils Relative %: 60 %
Platelets: 161 10*3/uL (ref 150–400)
RBC: 4.07 MIL/uL — ABNORMAL LOW (ref 4.22–5.81)
RDW: 12.4 % (ref 11.5–15.5)
WBC: 8.4 10*3/uL (ref 4.0–10.5)
nRBC: 0 % (ref 0.0–0.2)

## 2022-10-02 LAB — BASIC METABOLIC PANEL
Anion gap: 13 (ref 5–15)
BUN: 14 mg/dL (ref 8–23)
CO2: 29 mmol/L (ref 22–32)
Calcium: 8.4 mg/dL — ABNORMAL LOW (ref 8.9–10.3)
Chloride: 96 mmol/L — ABNORMAL LOW (ref 98–111)
Creatinine, Ser: 1.21 mg/dL (ref 0.61–1.24)
GFR, Estimated: >60 mL/min (ref >60)
Glucose, Bld: 188 mg/dL — ABNORMAL HIGH (ref 70–99)
Potassium: 2.6 mmol/L — CL (ref 3.5–5.1)
Sodium: 138 mmol/L (ref 135–145)

## 2022-10-02 LAB — MAGNESIUM: Magnesium: 1 mg/dL — ABNORMAL LOW (ref 1.7–2.4)

## 2022-10-02 MED ORDER — POTASSIUM CHLORIDE CRYS ER 20 MEQ PO TBCR
60.0000 meq | EXTENDED_RELEASE_TABLET | Freq: Once | ORAL | Status: AC
  Administered 2022-10-02: 60 meq via ORAL
  Filled 2022-10-02: qty 3

## 2022-10-02 MED ORDER — MAGNESIUM SULFATE 2 GM/50ML IV SOLN
2.0000 g | Freq: Once | INTRAVENOUS | Status: AC
  Administered 2022-10-02: 2 g via INTRAVENOUS
  Filled 2022-10-02: qty 50

## 2022-10-02 MED ORDER — POTASSIUM CHLORIDE CRYS ER 20 MEQ PO TBCR: 40.0000 meq | EXTENDED_RELEASE_TABLET | Freq: Every day | ORAL | 0 refills | Status: DC

## 2022-10-02 MED ORDER — POTASSIUM CHLORIDE IN NACL 20-0.9 MEQ/L-% IV SOLN
Freq: Once | INTRAVENOUS | Status: AC
  Filled 2022-10-02: qty 1000

## 2022-10-02 NOTE — ED Provider Notes (Signed)
[facility] Provider Note   CSN: [id number] Arrival date & time: 10/02/22  1227     History  Chief Complaint  Patient presents with   Abnormal labs    Potassium 2.7    Justin Nolan is a 62 y.o. male.  HPI Patient presents with concern for hypokalemia.  Patient was at his physicians office, had labs that were abnormal and he was sent here. He notes occasional mild fogginess, but no pain, no dyspnea, no syncope.  After the initial results were available I discussed his history and he notes that his previous been diagnosed with hypokalemia, with starting this medication after the prescription ran out.       Home Medications Prior to Admission medications   Medication Sig Start Date End Date Taking? Authorizing Provider  amLODipine (NORVASC) 5 MG tablet Take 1 tablet (5 mg total) by mouth daily. Patient taking differently: Take 10 mg by mouth daily.  09/24/15   Robyn Haber, MD  Ascorbic Acid (VITAMIN C PO) Take 1 tablet by mouth daily.    [provider]  aspirin EC 325 MG tablet Take 1 tablet (325 mg total) by mouth 2 (two) times daily. 05/07/17   Leandrew Koyanagi, MD  cephALEXin (KEFLEX) 500 MG capsule Take 1 capsule (500 mg total) by mouth 3 (three) times daily. 04/25/19   Virgel Manifold, MD  chlorthalidone (HYGROTON) 25 MG tablet Take 12.5 mg by mouth daily.    [provider]  Cyanocobalamin (VITAMIN B-12 PO) Take 1 tablet by mouth daily.    [provider]  Flaxseed, Linseed, (FLAX SEED OIL) 1000 MG CAPS Take 1,000 mg by mouth daily.    [provider]  HYDROcodone-acetaminophen (NORCO/VICODIN) 5-325 MG tablet 1 po q d prn pain 04/13/18   Leandrew Koyanagi, MD  Lysine 1000 MG TABS Take 1,000 mg by mouth daily.    [provider]  Menaquinone-7 (VITAMIN K2 PO) Take 1 tablet by mouth daily.    [provider]  methocarbamol (ROBAXIN) 500 MG tablet 1 po q 8-12 hrs prn spasm  04/13/18   Leandrew Koyanagi, MD  Methylsulfonylmethane (MSM) 1000 MG CAPS Take 1,000 mg by mouth daily.    [provider]  Multiple Vitamin (MULTIVITAMIN WITH MINERALS) TABS Take 1 tablet by mouth daily. 02/12/12   Blain Pais, MD  ondansetron (ZOFRAN) 4 MG tablet Take 1-2 tablets (4-8 mg total) by mouth every 8 (eight) hours as needed for nausea or vomiting. 05/07/17   Leandrew Koyanagi, MD  oxyCODONE-acetaminophen (PERCOCET/ROXICET) 5-325 MG tablet Take 1-2 tablets by mouth every 6 (six) hours as needed for severe pain. 04/25/19   Virgel Manifold, MD  pantoprazole (PROTONIX) 40 MG tablet TAKE ONE TABLET BY MOUTH ONCE DAILY Patient taking differently: Take 40 mg by mouth daily.  07/19/15   Jaynee Eagles, PA-C  predniSONE (STERAPRED UNI-PAK 21 TAB) 10 MG (21) TBPK tablet Take as directed 02/11/18   Aundra Dubin, PA-C  promethazine (PHENERGAN) 25 MG tablet Take 1 tablet (25 mg total) by mouth every 6 (six) hours as needed for nausea. 05/07/17   Leandrew Koyanagi, MD  senna-docusate (SENOKOT S) 8.6-50 MG tablet Take 1 tablet by mouth at bedtime as needed. 05/07/17   Leandrew Koyanagi, MD  sucralfate (CARAFATE) 1 G tablet Take 1 tablet (1 g total) by mouth 4 (four) times daily -  with meals and at bedtime. Patient not taking: Reported on 07/19/2015 11/29/14  Copland, Gay Filler, MD  tamsulosin (FLOMAX) 0.4 MG CAPS capsule TAKE ONE CAPSULE BY MOUTH ONCE DAILY Patient taking differently: TAKE 0.4 MG BY MOUTH ONCE DAILY 02/22/16   Robyn Haber, MD  tiZANidine (ZANAFLEX) 4 MG tablet Take 1 tablet (4 mg total) by mouth every 6 (six) hours as needed for muscle spasms. Patient not taking: Reported on 02/11/2018 05/07/17   Leandrew Koyanagi, MD  tiZANidine (ZANAFLEX) 4 MG tablet 1 tab tid prn Patient not taking: Reported on 02/11/2018 07/31/17   Leandrew Koyanagi, MD      Allergies    Patient has no known allergies.    Review of Systems   Review of Systems  All other systems reviewed and are negative.   Physical  Exam Updated Vital Signs BP 139/85   Pulse 71   Temp 98.4 F (36.9 C) (Oral)   Resp 18   Ht 5' 11.5" (1.816 m)   Wt 97.5 kg   SpO2 96%   BMI 29.57 kg/m  Physical Exam Vitals and nursing note reviewed.  Constitutional:      General: He is not in acute distress.    Appearance: He is well-developed.  HENT:     Head: Normocephalic and atraumatic.  Eyes:     Conjunctiva/sclera: Conjunctivae normal.  Cardiovascular:     Rate and Rhythm: Normal rate and regular rhythm.  Pulmonary:     Effort: Pulmonary effort is normal. No respiratory distress.     Breath sounds: No stridor.  Abdominal:     General: There is no distension.  Skin:    General: Skin is warm and dry.  Neurological:     Mental Status: He is alert and oriented to person, place, and time.     ED Results / Procedures / Treatments   Labs (all labs ordered are listed, but only abnormal results are displayed) Labs Reviewed  CBC WITH DIFFERENTIAL/PLATELET - Abnormal; Notable for the following components:      Result Value   RBC 4.07 (*)    All other components within normal limits  BASIC METABOLIC PANEL - Abnormal; Notable for the following components:   Potassium 2.6 (*)    Chloride 96 (*)    Glucose, Bld 188 (*)    Calcium 8.4 (*)    All other components within normal limits  MAGNESIUM - Abnormal; Notable for the following components:   Magnesium 1.0 (*)    All other components within normal limits    EKG EKG Interpretation  Date/Time:  Thursday October 02 2022 16:13:54 EDT Ventricular Rate:  69 PR Interval:  155 QRS Duration: 105 QT Interval:  458 QTC Calculation: 491 R Axis:   -31 Text Interpretation: Sinus rhythm Atrial premature complex Left axis deviation T wave abnormality Artifact Abnormal ECG Confirmed by Carmin Muskrat [phone]) on 10/02/2022 4:17:55 PM  Radiology No results found.  Procedures Procedures    Medications Ordered in ED Medications  0.9 % NaCl with KCl 20 mEq/ L  infusion (  Intravenous New Bag/Given 10/02/22 1441)  magnesium sulfate IVPB 2 g 50 mL (has no administration in time range)  potassium chloride SA (KLOR-CON M) CR tablet 60 mEq (60 mEq Oral Given 10/02/22 1440)    ED Course/ Medical Decision Making/ A&P                             Medical Decision Making With multiple medical problems including hypokalemia presents with vague complaints, concern  from outpatient labs of hypokalemia.  Suspicion for medication noncompliance, less likely infection.  Patient placed on continuous monitoring, pulse oximetry, labs sent. Cardiac 70 sinus normal Pulse ox 100% room air normal   Amount and/or Complexity of Data Reviewed External Data Reviewed: notes. Labs: ordered. Decision-making details documented in ED Course. ECG/medicine tests: ordered and independent interpretation performed. Decision-making details documented in ED Course.  Risk Prescription drug management. Decision regarding hospitalization.  4:18 PM  On repeat exam the patient is awake, alert, speaking clearly.  Labs notable for hypokalemia, requiring IV and oral repletion.  Patient also found to have hypomagnesemia, but is awake, alert, monitored for hours without any decompensation. He and I discussed the importance of obtaining and taking his potassium on discharge, and after he had repletion here, without evidence for other acute findings, patient discharged in stable condition.         Final Clinical Impression(s) / ED Diagnoses Final diagnoses:  Hypokalemia  Hypomagnesemia     Carmin Muskrat, MD 10/02/22 [phone]

## 2022-10-02 NOTE — Discharge Instructions (Addendum)
Please be sure to obtain and take your potassium.    Have your blood levels checked next week to ensure that your tests have normalized.    Follow up with your doctor  Return here for concerning changes in your condition.

## 2022-10-02 NOTE — ED Triage Notes (Signed)
Sent by PCP for low potassium of 2.7. Pt states he feels "foggy". Labs were drawn today. Pt denies other sx.

## 2022-10-02 NOTE — ED Provider Notes (Signed)
  Physical Exam  BP (!) 158/95   Pulse 72   Temp 98.1 F (36.7 C)   Resp (!) 21   Ht 5' 11.5" (1.816 m)   Wt 97.5 kg   SpO2 96%   BMI 29.57 kg/m   Physical Exam  Procedures  Procedures  ED Course / MDM    Medical Decision Making Care assumed at 4 pm.  Patient was noted to be hypokalemic and sent in for IV potassium.  Patient signed out pending IV potassium and discharge.  5:02 PM Patient's potassium was 2.6 and was supplemented.  Patient's IV potassium and IV magnesium are done and is feeling better now.  Stable for discharge.  Will prescribe potassium   Problems Addressed: Hypokalemia: acute illness or injury Hypomagnesemia: acute illness or injury  Amount and/or Complexity of Data Reviewed Labs: ordered. Decision-making details documented in ED Course.  Risk Prescription drug management.          Drenda Freeze, MD 10/02/22 [phone]

## 2023-03-06 ENCOUNTER — Emergency Department (HOSPITAL_COMMUNITY)
Admission: EM | Admit: 2023-03-06 | Discharge: 2023-03-06 | Disposition: A | Discharge status: Home / Self Care | Payer: No Typology Code available for payment source | Attending: Emergency Medicine | Admitting: Emergency Medicine

## 2023-03-06 ENCOUNTER — Emergency Department (HOSPITAL_COMMUNITY): Payer: No Typology Code available for payment source

## 2023-03-06 ENCOUNTER — Other Ambulatory Visit: Payer: Self-pay

## 2023-03-06 ENCOUNTER — Encounter (HOSPITAL_COMMUNITY): Payer: Self-pay

## 2023-03-06 DIAGNOSIS — Z79899 Other long term (current) drug therapy: Secondary | ICD-10-CM | POA: Insufficient documentation

## 2023-03-06 DIAGNOSIS — Z7982 Long term (current) use of aspirin: Secondary | ICD-10-CM | POA: Insufficient documentation

## 2023-03-06 DIAGNOSIS — Z96651 Presence of right artificial knee joint: Secondary | ICD-10-CM | POA: Insufficient documentation

## 2023-03-06 DIAGNOSIS — M25462 Effusion, left knee: Secondary | ICD-10-CM | POA: Diagnosis not present

## 2023-03-06 DIAGNOSIS — I10 Essential (primary) hypertension: Secondary | ICD-10-CM | POA: Insufficient documentation

## 2023-03-06 DIAGNOSIS — M1712 Unilateral primary osteoarthritis, left knee: Secondary | ICD-10-CM

## 2023-03-06 DIAGNOSIS — M7989 Other specified soft tissue disorders: Secondary | ICD-10-CM | POA: Diagnosis present

## 2023-03-06 LAB — SYNOVIAL CELL COUNT + DIFF, W/ CRYSTALS
Crystals, Fluid: NONE SEEN
Monocyte-Macrophage-Synovial Fluid: 5 % — ABNORMAL LOW (ref 50–90)
Neutrophil, Synovial: 95 % — ABNORMAL HIGH (ref 0–25)
WBC, Synovial: 65145 /mm3 — ABNORMAL HIGH (ref 0–200)

## 2023-03-06 MED ORDER — LIDOCAINE-EPINEPHRINE (PF) 2 %-1:200000 IJ SOLN
INTRAMUSCULAR | Status: AC
  Filled 2023-03-06: qty 20

## 2023-03-06 MED ORDER — LIDOCAINE-EPINEPHRINE 2 %-1:100000 IJ SOLN: 20.0000 mL | Freq: Once | INTRAMUSCULAR | Status: DC

## 2023-03-06 MED ORDER — PREDNISONE 20 MG PO TABS: 40.0000 mg | ORAL_TABLET | Freq: Every day | ORAL | 0 refills | Status: AC

## 2023-03-06 MED ORDER — HYDROMORPHONE HCL 1 MG/ML IJ SOLN
1.0000 mg | Freq: Once | INTRAMUSCULAR | Status: AC
  Administered 2023-03-06: 1 mg via INTRAVENOUS
  Filled 2023-03-06: qty 1

## 2023-03-06 NOTE — ED Notes (Signed)
Patient approached the RN station to leave. AVS given to the patient and explained by paramedic.

## 2023-03-06 NOTE — ED Provider Notes (Signed)
7:30 PM Assumed care of patient from off-going team. For more details, please see note from same day.  In brief, this is a 62 y.o. 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 No h/o gout. Not clear infectious presentation but performed arthrocentesis to r/o septic arthritis. WBC 65,145, neutrophil 95%, negative crystals.   Plan/Dispo at time of sign-out & ED Course since sign-out: [ ]  gram stain, ortho (Dr. Magnus Ivan)  BP 123/85 (BP Location: Right Arm)   Pulse 69   Temp 99.3 F (37.4 C) (Oral)   Resp 18   Ht 5' 11.5" (1.816 m)   Wt 100.7 kg   SpO2 100%   BMI 30.53 kg/m    ED Course:   Clinical Course as of 03/06/23 1930  Fri Mar 06, 2023  1346 Synovial fluid does show elevated white blood cell count greater than 50,000 [JK]  1447 Reviewed case with Dr. Magnus Ivan.  Crystal analysis is still pending.  Will add on x-rays CBC CRP.  He will follow-up once we have the complete synovial fluid analysis [JK]  1504 No crystals seen on synovial fluid [JK]  1527 Reviewed with lab.  Sample was sent to [facility] to complete the rest of the labs including gram stain  [JK]  1748 Body fluid culture w Gram Stain No organisms on gram stain. Orthopedics pulled another 90 cc of fluid off the knee. Does not believe it is a septic arthritis. Likely arthritis. Recommended steroid burst x 5 days and o/p f/u. DC w/ discharge instructions/return precautions. All questions answered to patient's satisfaction.   [HN]    Clinical Course User Index [HN] Loetta Rough, MD [JK] Linwood Dibbles, MD   ------------------------------- Vivi Barrack, MD Emergency Medicine  This note was created using dictation software, which may contain spelling or grammatical errors.   Loetta Rough, MD 03/06/23 [phone]

## 2023-03-06 NOTE — ED Provider Notes (Signed)
[facility] AT [facility] Provider Note   CSN: [id number] Arrival date & time: 03/06/23  1041     History  Chief Complaint  Patient presents with   Knee Pain    Justin Nolan is a 61 y.o. male.   Knee Pain    Patient has a history of hypertension pneumonia reflux arthritis BPH.  He presents to the ED with complaints of knee pain and swelling.  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  He has had patellar tendon surgery on the left side.  Patient states in the last couple weeks he started having creasing pain in his left knee.  Patient states he started having swelling however in the last day or so.  Patient states it is very painful for him to bend his knee.  He feels like there is bone-on-bone.  Patient states it feels similar to his right knee before he ended up having surgery.  Patient went to his doctor's office today.  They noted significant swelling in his knee and they were concerned about the possibility of infection.  He was instructed to come to the ED for evaluation  Home Medications Prior to Admission medications   Medication Sig Start Date End Date Taking? Authorizing Provider  amLODipine (NORVASC) 5 MG tablet Take 1 tablet (5 mg total) by mouth daily. Patient taking differently: Take 10 mg by mouth daily.  09/24/15   Elvina Sidle, MD  Ascorbic Acid (VITAMIN C PO) Take 1 tablet by mouth daily.    [provider]  aspirin EC 325 MG tablet Take 1 tablet (325 mg total) by mouth 2 (two) times daily. 05/07/17   Tarry Kos, MD  cephALEXin (KEFLEX) 500 MG capsule Take 1 capsule (500 mg total) by mouth 3 (three) times daily. 04/25/19   Raeford Razor, MD  chlorthalidone (HYGROTON) 25 MG tablet Take 12.5 mg by mouth daily.    [provider]  Cyanocobalamin (VITAMIN B-12 PO) Take 1 tablet by mouth daily.    [provider]  Flaxseed, Linseed, (FLAX SEED OIL) 1000 MG CAPS Take  1,000 mg by mouth daily.    [provider]  HYDROcodone-acetaminophen (NORCO/VICODIN) 5-325 MG tablet 1 po q d prn pain 04/13/18   Tarry Kos, MD  Lysine 1000 MG TABS Take 1,000 mg by mouth daily.    [provider]  Menaquinone-7 (VITAMIN K2 PO) Take 1 tablet by mouth daily.    [provider]  methocarbamol (ROBAXIN) 500 MG tablet 1 po q 8-12 hrs prn spasm 04/13/18   Tarry Kos, MD  Methylsulfonylmethane (MSM) 1000 MG CAPS Take 1,000 mg by mouth daily.    [provider]  Multiple Vitamin (MULTIVITAMIN WITH MINERALS) TABS Take 1 tablet by mouth daily. 02/12/12   Ky Barban, MD  ondansetron (ZOFRAN) 4 MG tablet Take 1-2 tablets (4-8 mg total) by mouth every 8 (eight) hours as needed for nausea or vomiting. 05/07/17   Tarry Kos, MD  oxyCODONE-acetaminophen (PERCOCET/ROXICET) 5-325 MG tablet Take 1-2 tablets by mouth every 6 (six) hours as needed for severe pain. 04/25/19   Raeford Razor, MD  pantoprazole (PROTONIX) 40 MG tablet TAKE ONE TABLET BY MOUTH ONCE DAILY Patient taking differently: Take 40 mg by mouth daily.  07/19/15   Wallis Bamberg, PA-C  potassium chloride SA (KLOR-CON M) 20 MEQ tablet Take 2 tablets (40 mEq total) by mouth daily. 10/02/22   Chaney Malling  Hsienta, MD  predniSONE (STERAPRED UNI-PAK 21 TAB) 10 MG (21) TBPK tablet Take as directed 02/11/18   Cristie Hem, PA-C  promethazine (PHENERGAN) 25 MG tablet Take 1 tablet (25 mg total) by mouth every 6 (six) hours as needed for nausea. 05/07/17   Tarry Kos, MD  senna-docusate (SENOKOT S) 8.6-50 MG tablet Take 1 tablet by mouth at bedtime as needed. 05/07/17   Tarry Kos, MD  sucralfate (CARAFATE) 1 G tablet Take 1 tablet (1 g total) by mouth 4 (four) times daily -  with meals and at bedtime. Patient not taking: Reported on 07/19/2015 11/29/14   Copland, Gwenlyn Found, MD  tamsulosin (FLOMAX) 0.4 MG CAPS capsule TAKE ONE CAPSULE BY MOUTH ONCE DAILY Patient taking differently: TAKE 0.4  MG BY MOUTH ONCE DAILY 02/22/16   Elvina Sidle, MD  tiZANidine (ZANAFLEX) 4 MG tablet Take 1 tablet (4 mg total) by mouth every 6 (six) hours as needed for muscle spasms. Patient not taking: Reported on 02/11/2018 05/07/17   Tarry Kos, MD  tiZANidine (ZANAFLEX) 4 MG tablet 1 tab tid prn Patient not taking: Reported on 02/11/2018 07/31/17   Tarry Kos, MD      Allergies    Patient has no known allergies.    Review of Systems   Review of Systems  Physical Exam Updated Vital Signs BP (!) 172/97   Pulse 64   Temp 98 F (36.7 C) (Oral)   Resp 18   Ht 1.816 m (5' 11.5")   Wt 100.7 kg   SpO2 99%   BMI 30.53 kg/m  Physical Exam Vitals and nursing note reviewed.  Constitutional:      General: He is not in acute distress.    Appearance: He is well-developed. He is not ill-appearing.  HENT:     Head: Normocephalic and atraumatic.     Right Ear: External ear normal.     Left Ear: External ear normal.  Eyes:     General: No scleral icterus.       Right eye: No discharge.        Left eye: No discharge.     Conjunctiva/sclera: Conjunctivae normal.  Neck:     Trachea: No tracheal deviation.  Cardiovascular:     Rate and Rhythm: Normal rate.  Pulmonary:     Effort: Pulmonary effort is normal. No respiratory distress.     Breath sounds: No stridor.  Abdominal:     General: There is no distension.  Musculoskeletal:        General: Swelling and tenderness present. No deformity.     Cervical back: Neck supple.     Right knee: Normal.     Left knee: Swelling and effusion present. No erythema. Decreased range of motion. Tenderness present.  Skin:    General: Skin is warm and dry.     Findings: No rash.  Neurological:     Mental Status: He is alert. Mental status is at baseline.     Cranial Nerves: No dysarthria or facial asymmetry.     Motor: No seizure activity.     ED Results / Procedures / Treatments   Labs (all labs ordered are listed, but only abnormal results are  displayed) Labs Reviewed  SYNOVIAL CELL COUNT + DIFF, W/ CRYSTALS - Abnormal; Notable for the following components:      Result Value   Appearance-Synovial TURBID (*)    WBC, Synovial 65,145 (*)    Neutrophil, Synovial 95 (*)  Monocyte-Macrophage-Synovial Fluid 5 (*)    All other components within normal limits  BODY FLUID CULTURE W GRAM STAIN  GLUCOSE, BODY FLUID OTHER            PROTEIN, BODY FLUID (OTHER)  URIC ACID, BODY FLUID  CBC  BASIC METABOLIC PANEL  C-REACTIVE PROTEIN    EKG None  Radiology No results found.  Procedures .Joint Aspiration/Arthrocentesis  Date/Time: 03/06/2023 11:55 AM  Performed by: Linwood Dibbles, MD Authorized by: Linwood Dibbles, MD   Consent:    Consent obtained:  Verbal   Consent given by:  Patient   Risks, benefits, and alternatives were discussed: yes     Risks discussed:  Bleeding, infection, pain and incomplete drainage Universal protocol:    Patient identity confirmed:  Verbally with patient Location:    Location:  Knee   Knee:  R knee Anesthesia:    Anesthesia method:  Local infiltration   Local anesthetic:  Lidocaine 2% WITH epi Procedure details:    Preparation: Patient was prepped and draped in usual sterile fashion     Needle gauge:  18 G   Ultrasound guidance: no     Approach:  Lateral   Aspirate amount:  75   Aspirate characteristics:  Cloudy and yellow   Steroid injected: no     Specimen collected: yes   Post-procedure details:    Dressing:  Sterile dressing   Procedure completion:  Tolerated well, no immediate complications     Medications Ordered in ED Medications  lidocaine-EPINEPHrine (XYLOCAINE W/EPI) 2 %-1:100000 (with pres) injection 20 mL (20 mLs Infiltration Not Given 03/06/23 1122)  HYDROmorphone (DILAUDID) injection 1 mg (1 mg Intravenous Given 03/06/23 1147)  lidocaine-EPINEPHrine (XYLOCAINE W/EPI) 2 %-1:200000 (PF) injection (  Given 03/06/23 1123)    ED Course/ Medical Decision Making/ A&P Clinical  Course as of 03/06/23 1539  Fri Mar 06, 2023  1346 Synovial fluid does show elevated white blood cell count greater than 50,000 [JK]  1447 Reviewed case with Dr. Magnus Ivan.  Crystal analysis is still pending.  Will add on x-rays CBC CRP.  He will follow-up once we have the complete synovial fluid analysis [JK]  1504 No crystals seen on synovial fluid [JK]  1527 Reviewed with lab.  Sample was sent to [facility] to complete the rest of the labs including gram stain  [JK]    Clinical Course User Index [JK] Linwood Dibbles, MD                                 Medical Decision Making Differential diagnosis includes but not limited to septic arthritis inflammatory arthritis  Amount and/or Complexity of Data Reviewed Labs: ordered. Radiology: ordered.  Risk Prescription drug management.   Patient presented to ED for evaluation of acute joint swelling.  Patient noted to have 65,000 white blood cells in the synovial fluid.  95% neutrophils.  No evidence of crystals on synovial fluid.  Gram stain is currently pending.  Evaluation still concerning for the possibility of septic arthritis.  I have consulted with Dr. Magnus Ivan orthopedics..  Orthopedics will review  Care turned over at shift change        Final Clinical Impression(s) / ED Diagnoses Final diagnoses:  Knee effusion, left    Rx / DC Orders ED Discharge Orders     None         Linwood Dibbles, MD 03/06/23 1539

## 2023-03-06 NOTE — Discharge Instructions (Addendum)
Thank you for coming to [facility] Emergency Department. You were seen for left knee pain. We did an exam, labs, and imaging, and these showed an effusion of the left knee without any signs of infection. This was drained by the ED physician as well as orthopedics. Orthopedic surgery recommended a prednisone burst of 40 mg per day for 5 days and follow up with Dr. Roda Shutters in clinic next week. Please call his clinic on Monday to schedule an appointment.   Do not hesitate to return to the ED or call 911 if you experience: -Worsening symptoms -Lightheadedness, passing out -Fevers/chills -Anything else that concerns you

## 2023-03-06 NOTE — ED Triage Notes (Signed)
Pt coming in today complaining left knee swelling, pain, and feeling like it is "bone on bone". Pt being seen in [facility]. for this complaint, s/s only becoming worse.

## 2023-03-06 NOTE — Consult Note (Signed)
Reason for Consult: Left knee effusion, questionable septic joint Referring Physician: Dr. Iantha Fallen, EDP  Justin Nolan is an 62 y.o. male.  HPI: The patient is a 62 year old veteran who has been dealing with worsening left knee pain for over 3 months now.  He has a history of a right total knee replacement performed in 2019 by my partner Dr. Roda Shutters.  He was seen by his primary care physician today and with knee swelling they were concerned about a septic joint so they sent him directly to the emergency room.  In the emergency room they did aspirate his knee and it showed 65,000 white blood cells and no crystals and no organisms.  Again the Gram stain was negative.  The patient is vital signs are stable.  He has been afebrile.  He denies any recent illnesses.  He said the pain has come along slowly but has been frustrating for him.  However he did say that his right knee did the same thing prior to that knee replacement.  Past Medical History:  Diagnosis Date   Arthritis    BPH (benign prostatic hyperplasia)    GERD (gastroesophageal reflux disease)    Hypertension    Pneumonia 02/09/2012    Past Surgical History:  Procedure Laterality Date   KNEE SURGERY     TOTAL KNEE ARTHROPLASTY Right 05/07/2017   Procedure: RIGHT TOTAL KNEE ARTHROPLASTY;  Surgeon: Tarry Kos, MD;  Location: [facility] OR;  Service: Orthopedics;  Laterality: Right;    Family History  Problem Relation Age of Onset   Diabetes Maternal Aunt    Diabetes Paternal Aunt     Social History:  reports that he has been smoking cigarettes. He has a 6.3 pack-year smoking history. He has never used smokeless tobacco. He reports current alcohol use of about 3.0 standard drinks of alcohol per week. He reports that he does not use drugs.  Allergies: No Known Allergies  Medications: I have reviewed the patient's current medications.  Results for orders placed or performed during the hospital encounter of 03/06/23 (from the past 48  hour(s))  Body fluid culture w Gram Stain     Status: None (Preliminary result)   Collection Time: 03/06/23 11:45 AM   Specimen: Synovium; Body Fluid  Result Value Ref Range   Specimen Description      SYNOVIAL Performed at [facility], [address] [place], [place] [address]    Special Requests      NONE Performed at [facility], [address], [place], [place] [address]    Gram Stain      ABUNDANT WBC PRESENT, PREDOMINANTLY PMN NO ORGANISMS SEEN Performed at [facility], [address], [place], [place] [address]    Culture PENDING    Report Status PENDING   Synovial cell count + diff, w/ crystals     Status: Abnormal   Collection Time: 03/06/23 11:45 AM  Result Value Ref Range   Color, Synovial YELLOW YELLOW   Appearance-Synovial TURBID (A) CLEAR   Crystals, Fluid NO CRYSTALS SEEN    WBC, Synovial 65,145 (H) 0 - 200 /cu mm   Neutrophil, Synovial 95 (H) 0 - 25 %   Monocyte-Macrophage-Synovial Fluid 5 (L) 50 - 90 %    Comment: Performed at [facility], [address] [place], [place] [address]    DG Knee 2 Views Left  Result Date: 03/06/2023 CLINICAL DATA:  knee pain, swelling EXAM: LEFT KNEE - 1-2  VIEW COMPARISON:  None Available. FINDINGS: No acute fracture or dislocation. No aggressive osseous lesion. There are mild degenerative changes of the left knee joint in the form of mildly reduced medial tibio-femoral compartment joint space, tibial spiking and tricompartmental osteophytosis. Small suprapatellar knee joint effusion noted. No focal soft tissue swelling. No radiopaque foreign bodies. IMPRESSION: 1. Mild degenerative changes. Small suprapatellar knee joint effusion. Electronically Signed   By: Jules Schick M.D.   On: 03/06/2023 16:14    Review of Systems  Musculoskeletal:  Positive for joint swelling.  All other systems reviewed and are negative.  Blood pressure 123/85, pulse 69, temperature  99.3 F (37.4 C), temperature source Oral, resp. rate 18, height 5' 11.5" (1.816 m), weight 100.7 kg, SpO2 100%. Physical Exam Vitals reviewed.  Constitutional:      Appearance: Normal appearance. He is normal weight.  HENT:     Head: Normocephalic and atraumatic.  Cardiovascular:     Rate and Rhythm: Normal rate.  Pulmonary:     Effort: Pulmonary effort is normal.  Abdominal:     Palpations: Abdomen is soft.  Musculoskeletal:     Cervical back: Normal range of motion.     Left knee: Effusion present. Tenderness present over the medial joint line.  Neurological:     Mental Status: He is alert and oriented to person, place, and time.  Psychiatric:        Behavior: Behavior normal.     Assessment/Plan: Left knee effusion that is likely inflammatory in nature.  At the bedside I was able to aspirate 90 more cc of yellow fluid from the knee.  This seems more consistent with an arthritic or inflammatory process and not an infection.  The Gram stain was negative and he has not been on any antibiotics.  I did communicate with the emergency room physician who is now seeing the patient that I feel that he would benefit from 5 days of prednisone 50 mg and to call our office Monday to be seen next week for further evaluation of his knee and likely steroid injection.  I did talk to the patient in length about this as well.  He is likely heading toward a knee replacement on that side.  He has been very pleased with his knee replacement on his right side and he is asymptomatic from that knee and that knee exam is also normal.  Kathryne Hitch 03/06/2023, 5:54 PM

## 2023-03-08 LAB — URIC ACID, BODY FLUID: Uric Acid Body Fluid: 6.6 mg/dL

## 2023-03-08 LAB — PROTEIN, BODY FLUID (OTHER): Total Protein, Body Fluid Other: 4.4 g/dL

## 2023-03-08 LAB — GLUCOSE, BODY FLUID OTHER: Glucose, Body Fluid Other: 15 mg/dL

## 2023-03-09 LAB — BODY FLUID CULTURE W GRAM STAIN: Culture: NO GROWTH

## 2023-03-26 ENCOUNTER — Ambulatory Visit (INDEPENDENT_AMBULATORY_CARE_PROVIDER_SITE_OTHER): Payer: No Typology Code available for payment source | Admitting: Orthopaedic Surgery

## 2023-03-26 DIAGNOSIS — M1712 Unilateral primary osteoarthritis, left knee: Secondary | ICD-10-CM

## 2023-03-26 DIAGNOSIS — M25462 Effusion, left knee: Secondary | ICD-10-CM

## 2023-03-26 MED ORDER — LIDOCAINE HCL 1 % IJ SOLN
2.0000 mL | INTRAMUSCULAR | Status: AC | PRN
Start: 2023-03-26 — End: 2023-03-26
  Administered 2023-03-26: 2 mL

## 2023-03-26 MED ORDER — METHYLPREDNISOLONE ACETATE 40 MG/ML IJ SUSP
40.0000 mg | INTRAMUSCULAR | Status: AC | PRN
Start: 2023-03-26 — End: 2023-03-26
  Administered 2023-03-26: 40 mg via INTRA_ARTICULAR

## 2023-03-26 MED ORDER — BUPIVACAINE HCL 0.5 % IJ SOLN
2.0000 mL | INTRAMUSCULAR | Status: AC | PRN
Start: 2023-03-26 — End: 2023-03-26
  Administered 2023-03-26: 2 mL via INTRA_ARTICULAR

## 2023-03-26 NOTE — Progress Notes (Signed)
Office Visit Note   Patient: Justin Nolan           Date of Birth: January 07, 1961           MRN: [id number] Visit Date: 03/26/2023              Requested by: Jeanine Luz [address] [facility] [place],  [place] [address] PCP: [facility]   Assessment & Plan: Visit Diagnoses:  1. Knee effusion, left   2. Primary osteoarthritis of left knee     Plan: Justin Nolan is a 62 year old gentleman with recurrent left knee pain and effusion.  Laboratory findings are unclear as to whether it is an inflammatory process or infectious process.  I went ahead and retrained the fusion today which I got about 40 cc and putting cortisone because the cultures and Gram stain were negative from 23 August.  We will notify him of these new results.  Follow-Up Instructions: No follow-ups on file.   Orders:  No orders of the defined types were placed in this encounter.  No orders of the defined types were placed in this encounter.     Procedures: Large Joint Inj: L knee on 03/26/2023 9:28 AM Details: 22 G needle Medications: 2 mL bupivacaine 0.5 %; 2 mL lidocaine 1 %; 40 mg methylPREDNISolone acetate 40 MG/ML Aspirate: 40 mL cloudy and yellow; sent for lab analysis Outcome: tolerated well, no immediate complications Patient was prepped and draped in the usual sterile fashion.       Clinical Data: No additional findings.   Subjective: Chief Complaint  Patient presents with   Left Knee - Pain    HPI Justin Nolan is a 62 year old gentleman following up from the ER from 03/06/2023.  He presented with left knee pain and swelling and for concern for septic arthritis.  He was evaluated by my partner Dr. Magnus Ivan that day in the ER and aspirated 90 cc of joint fluid.  The Gram stain and cultures were negative from that aspirate.  He felt that the patient was going through an arthritic or inflammatory process and did not present like a septic arthritis even though the white blood cell  count was 65,000.  Currently does not report any constitutional symptoms.  He feels that the effusion has reaccumulated and is very uncomfortable but he is not in severe pain.  Review of Systems  Constitutional: Negative.   HENT: Negative.    Eyes: Negative.   Respiratory: Negative.    Cardiovascular: Negative.   Gastrointestinal: Negative.   Endocrine: Negative.   Genitourinary: Negative.   Skin: Negative.   Allergic/Immunologic: Negative.   Neurological: Negative.   Hematological: Negative.   Psychiatric/Behavioral: Negative.    All other systems reviewed and are negative.    Objective: Vital Signs: There were no vitals taken for this visit.  Physical Exam Vitals and nursing note reviewed.  Constitutional:      Appearance: He is well-developed.  Pulmonary:     Effort: Pulmonary effort is normal.  Abdominal:     Palpations: Abdomen is soft.  Skin:    General: Skin is warm.  Neurological:     Mental Status: He is alert and oriented to person, place, and time.  Psychiatric:        Behavior: Behavior normal.        Thought Content: Thought content normal.        Judgment: Judgment normal.     Ortho Exam Exam of the left knee shows no warmth or  cellulitis.  Large joint effusion with pain with range of motion. Specialty Comments:  No specialty comments available.  Imaging: No results found.   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   Alcohol abuse 02/16/2012   HTN (hypertension) 02/09/2012   GERD (gastroesophageal reflux disease) 02/09/2012   Tobacco use 02/09/2012   Indirect hyperbilirubinemia 02/09/2012   Past Medical History:  Diagnosis Date   Arthritis    BPH (benign prostatic hyperplasia)    GERD (gastroesophageal reflux disease)    Hypertension    Pneumonia 02/09/2012    Family History  Problem Relation Age of  Onset   Diabetes Maternal Aunt    Diabetes Paternal Aunt     Past Surgical History:  Procedure Laterality Date   KNEE SURGERY     TOTAL KNEE ARTHROPLASTY Right 05/07/2017   Procedure: RIGHT TOTAL KNEE ARTHROPLASTY;  Surgeon: Tarry Kos, MD;  Location: [facility] OR;  Service: Orthopedics;  Laterality: Right;   Social History   Occupational History   Not on file  Tobacco Use   Smoking status: Every Day    Current packs/day: 0.25    Average packs/day: 0.3 packs/day for 25.0 years (6.3 ttl pk-yrs)    Types: Cigarettes   Smokeless tobacco: Never  Substance and Sexual Activity   Alcohol use: Yes    Alcohol/week: 3.0 standard drinks of alcohol    Types: 3 Shots of liquor per week    Comment: drink occasional   Drug use: No   Sexual activity: Yes

## 2023-04-01 NOTE — Progress Notes (Signed)
Please let him know that the fluid looks ok and does not indicated infection.  Likely an inflammatory process.  Should resolve with the cortisone.

## 2023-04-01 NOTE — Progress Notes (Signed)
Notified patient of results

## 2023-04-02 ENCOUNTER — Telehealth: Payer: Self-pay

## 2023-04-02 NOTE — Telephone Encounter (Signed)
Spoke to him.  He's going to have the [place] place a referral to [place] for TKA

## 2023-04-02 NOTE — Telephone Encounter (Signed)
Patient called and wants to know if you received the notes from the [place]? He dropped them off with the front desk yesterday. He wants you to call him after you review them.  [phone]

## 2023-04-02 NOTE — Telephone Encounter (Signed)
Yes.  The MRI shows advanced arthritis with bone-on-bone changes.

## 2023-04-02 NOTE — Telephone Encounter (Signed)
Called and relayed information to patient. He wants to know what the next steps or options are. He would like to speak with you.

## 2023-04-24 ENCOUNTER — Other Ambulatory Visit: Payer: Self-pay

## 2023-04-24 ENCOUNTER — Emergency Department (HOSPITAL_COMMUNITY)
Admission: EM | Admit: 2023-04-24 | Discharge: 2023-04-24 | Disposition: A | Discharge status: Home / Self Care | Payer: No Typology Code available for payment source | Attending: Emergency Medicine | Admitting: Emergency Medicine

## 2023-04-24 ENCOUNTER — Encounter: Payer: Self-pay | Admitting: Physician Assistant

## 2023-04-24 ENCOUNTER — Ambulatory Visit (INDEPENDENT_AMBULATORY_CARE_PROVIDER_SITE_OTHER): Payer: No Typology Code available for payment source | Admitting: Physician Assistant

## 2023-04-24 ENCOUNTER — Telehealth: Payer: Self-pay

## 2023-04-24 ENCOUNTER — Encounter (HOSPITAL_COMMUNITY): Payer: Self-pay

## 2023-04-24 DIAGNOSIS — M25562 Pain in left knee: Secondary | ICD-10-CM | POA: Diagnosis present

## 2023-04-24 DIAGNOSIS — M25462 Effusion, left knee: Secondary | ICD-10-CM

## 2023-04-24 DIAGNOSIS — Z7982 Long term (current) use of aspirin: Secondary | ICD-10-CM | POA: Insufficient documentation

## 2023-04-24 DIAGNOSIS — Z79899 Other long term (current) drug therapy: Secondary | ICD-10-CM | POA: Insufficient documentation

## 2023-04-24 MED ORDER — LIDOCAINE HCL 1 % IJ SOLN
3.0000 mL | INTRAMUSCULAR | Status: AC | PRN
Start: 2023-04-24 — End: 2023-04-24
  Administered 2023-04-24: 3 mL

## 2023-04-24 MED ORDER — LIDOCAINE HCL 2 % IJ SOLN
20.0000 mL | Freq: Once | INTRAMUSCULAR | Status: DC
  Filled 2023-04-24: qty 20

## 2023-04-24 NOTE — Progress Notes (Signed)
Office Visit Note   Patient: Justin Nolan           Date of Birth: Jan 04, 1961           MRN: [id number] Visit Date: 04/24/2023              Requested by: Jeanine Luz [address] [facility] [place],  [place] [address] PCP: [facility]  Chief Complaint  Patient presents with  . Left Knee - Pain, Joint Swelling      HPI: Patient is a pleasant 62 year old gentleman who is a patient of Dr. Roda Shutters.  Presents today for recurrent effusion in his left knee denies any fever or chills requesting aspiration this is his third aspiration rates his pain is moderate and significant enough that he went to the emergency room earlier.  No injury.  Assessment & Plan: Visit Diagnoses:  1. Knee effusion, left     Plan: Aspirated 40 cc of yellow cloudy fluid.  When comparing to previous aspirates as patient had pictures was the same.  Patient was able to lift his leg easier after the aspiration I injected 30 cc of Toradol intra-articularly patient is waiting to be called to see about scheduling knee replacement  Follow-Up Instructions: No follow-ups on file.  Ortho Exam  Patient is alert, oriented, no adenopathy, well-dressed, normal affect, normal respiratory effort. Examination of his left knee he has mild warmth but no erythema or cellulitis he has a moderate effusion.  He has difficulty firing the quadriceps complex prior to the injection better after the injection  Imaging: No results found. No images are attached to the encounter.  Labs: Lab Results  Component Value Date   HGBA1C 5.7 (H) 02/09/2012   ESRSEDRATE 3 04/30/2017   CRP 0.8 04/30/2017   REPTSTATUS 03/09/2023 FINAL 03/06/2023   GRAMSTAIN  03/06/2023    ABUNDANT WBC PRESENT, PREDOMINANTLY PMN NO ORGANISMS SEEN    CULT  03/06/2023    NO GROWTH 3 DAYS Performed at [facility], [address] [place], [place] [address]    LABORGA NO GROWTH 11/29/2014     Lab Results  Component  Value Date   ALBUMIN 4.4 04/25/2019   ALBUMIN 4.5 04/30/2017   ALBUMIN 4.6 11/29/2014    Lab Results  Component Value Date   MG 1.0 (L) 10/02/2022   No results found for: "VD25OH"  No results found for: "PREALBUMIN"    Latest Ref Rng & Units 10/02/2022   12:56 PM 04/25/2019    7:48 PM 05/08/2017    4:22 AM  CBC EXTENDED  WBC 4.0 - 10.5 K/uL 8.4  9.7  17.4   RBC 4.22 - 5.81 MIL/uL 4.07  4.07  3.99   Hemoglobin 13.0 - 17.0 g/dL 82.9  56.2  13.0   HCT 39.0 - 52.0 % 40.5  40.4  39.0   Platelets 150 - 400 K/uL 161  200  164   NEUT# 1.7 - 7.7 K/uL 5.1  4.5    Lymph# 0.7 - 4.0 K/uL 2.5  4.2       There is no height or weight on file to calculate BMI.  Orders:  No orders of the defined types were placed in this encounter.  No orders of the defined types were placed in this encounter.    Procedures: Large Joint Inj: L knee on 04/24/2023 2:10 PM Indications: pain and diagnostic evaluation Details: 25 G 1.5 in needle, superolateral approach  Arthrogram: No  Medications: 3 mL lidocaine 1 %  Aspirate: 40 mL yellow Outcome: tolerated well, no immediate complications  After obtaining verbal consent the superior lateral pouch was prepped with alcohol and Betadine.  3 cc of lidocaine plain was injected.  After adequate analgesia 40 cc of cloudy yellow fluid was aspirated.  30 cc as of Toradol was injected Band-Aid and Ace wrap applied patient self ambulated from the clinic Procedure, treatment alternatives, risks and benefits explained, specific risks discussed. Consent was given by the patient.     Clinical Data: No additional findings.  ROS:  All other systems negative, except as noted in the HPI. Review of Systems  Objective: Vital Signs: There were no vitals taken for this visit.  Specialty Comments:  No specialty comments available.  PMFS History: Patient Active Problem List   Diagnosis Date Noted  . Knee effusion, left 03/06/2023  . Chronic low back pain  02/11/2018  . 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 Alcohol abuse 02/16/2012  . HTN (hypertension) 02/09/2012  . GERD (gastroesophageal reflux disease) 02/09/2012  . Tobacco use 02/09/2012  . Indirect hyperbilirubinemia 02/09/2012   Past Medical History:  Diagnosis Date  . Arthritis   . BPH (benign prostatic hyperplasia)   . GERD (gastroesophageal reflux disease)   . Hypertension   . Pneumonia 02/09/2012    Family History  Problem Relation Age of Onset  . Diabetes Maternal Aunt   . Diabetes Paternal Aunt     Past Surgical History:  Procedure Laterality Date  . KNEE SURGERY    . TOTAL KNEE ARTHROPLASTY Right 05/07/2017   Procedure: RIGHT TOTAL KNEE ARTHROPLASTY;  Surgeon: Tarry Kos, MD;  Location: [facility] OR;  Service: Orthopedics;  Laterality: Right;   Social History   Occupational History  . Not on file  Tobacco Use  . Smoking status: Every Day    Current packs/day: 0.25    Average packs/day: 0.3 packs/day for 25.0 years (6.3 ttl pk-yrs)    Types: Cigarettes  . Smokeless tobacco: Never  Substance and Sexual Activity  . Alcohol use: Yes    Alcohol/week: 3.0 standard drinks of alcohol    Types: 3 Shots of liquor per week    Comment: drink occasional  . Drug use: No  . Sexual activity: Yes

## 2023-04-24 NOTE — ED Provider Notes (Signed)
[place] EMERGENCY DEPARTMENT AT [facility] Provider Note   CSN: [id number] Arrival date & time: 04/24/23  8119     History  Chief Complaint  Patient presents with   Knee Pain    Justin Nolan is a 62 y.o. male.  Patient presents to the emergency department requesting fluid be removed from his knee.  Patient reports that he has severe arthritis in his knee and is supposed to have a total knee replacement.  Patient states that he has had previously had to have fluid removed twice in the past.  Patient reports he began swelling on Tuesday of this week and the swelling has progressively gotten worse.  Patient has not had any fever or chills.  He denies any redness or swelling.  Patient reports previous aspirations have not shown any evidence of infection   Knee Pain      Home Medications Prior to Admission medications   Medication Sig Start Date End Date Taking? Authorizing Provider  amLODipine (NORVASC) 5 MG tablet Take 1 tablet (5 mg total) by mouth daily. Patient taking differently: Take 10 mg by mouth daily. 09/24/15   Elvina Sidle, MD  Ascorbic Acid (VITAMIN C PO) Take 1 tablet by mouth daily.    [provider]  aspirin EC 325 MG tablet Take 1 tablet (325 mg total) by mouth 2 (two) times daily. 05/07/17   Tarry Kos, MD  cephALEXin (KEFLEX) 500 MG capsule Take 1 capsule (500 mg total) by mouth 3 (three) times daily. 04/25/19   Raeford Razor, MD  chlorthalidone (HYGROTON) 25 MG tablet Take 12.5 mg by mouth daily.    [provider]  Cyanocobalamin (VITAMIN B-12 PO) Take 1 tablet by mouth daily.    [provider]  Flaxseed, Linseed, (FLAX SEED OIL) 1000 MG CAPS Take 1,000 mg by mouth daily.    [provider]  HYDROcodone-acetaminophen (NORCO/VICODIN) 5-325 MG tablet 1 po q d prn pain 04/13/18   Tarry Kos, MD  Lysine 1000 MG TABS Take 1,000 mg by mouth daily.    [provider]  Menaquinone-7 (VITAMIN K2 PO)  Take 1 tablet by mouth daily.    [provider]  methocarbamol (ROBAXIN) 500 MG tablet 1 po q 8-12 hrs prn spasm 04/13/18   Tarry Kos, MD  Methylsulfonylmethane (MSM) 1000 MG CAPS Take 1,000 mg by mouth daily.    [provider]  Multiple Vitamin (MULTIVITAMIN WITH MINERALS) TABS Take 1 tablet by mouth daily. 02/12/12   Ky Barban, MD  ondansetron (ZOFRAN) 4 MG tablet Take 1-2 tablets (4-8 mg total) by mouth every 8 (eight) hours as needed for nausea or vomiting. 05/07/17   Tarry Kos, MD  oxyCODONE-acetaminophen (PERCOCET/ROXICET) 5-325 MG tablet Take 1-2 tablets by mouth every 6 (six) hours as needed for severe pain. 04/25/19   Raeford Razor, MD  pantoprazole (PROTONIX) 40 MG tablet TAKE ONE TABLET BY MOUTH ONCE DAILY Patient taking differently: Take 40 mg by mouth daily. 07/19/15   Wallis Bamberg, PA-C  potassium chloride SA (KLOR-CON M) 20 MEQ tablet Take 2 tablets (40 mEq total) by mouth daily. 10/02/22   Charlynne Pander, MD  promethazine (PHENERGAN) 25 MG tablet Take 1 tablet (25 mg total) by mouth every 6 (six) hours as needed for nausea. 05/07/17   Tarry Kos, MD  senna-docusate (SENOKOT S) 8.6-50 MG tablet Take 1 tablet by mouth at bedtime as needed. 05/07/17   Tarry Kos, MD  sucralfate (  CARAFATE) 1 G tablet Take 1 tablet (1 g total) by mouth 4 (four) times daily -  with meals and at bedtime. 11/29/14   Copland, Gwenlyn Found, MD  tamsulosin (FLOMAX) 0.4 MG CAPS capsule TAKE ONE CAPSULE BY MOUTH ONCE DAILY Patient taking differently: No sig reported 02/22/16   Elvina Sidle, MD  tiZANidine (ZANAFLEX) 4 MG tablet Take 1 tablet (4 mg total) by mouth every 6 (six) hours as needed for muscle spasms. 05/07/17   Tarry Kos, MD  tiZANidine (ZANAFLEX) 4 MG tablet 1 tab tid prn 07/31/17   Tarry Kos, MD      Allergies    Patient has no known allergies.    Review of Systems   Review of Systems  All other systems reviewed and are negative.   Physical  Exam Updated Vital Signs BP 139/87 (BP Location: Left Arm)   Pulse 82   Temp 99.1 F (37.3 C) (Oral)   Resp 18   Ht 5\' 11"  (1.803 m)   Wt 98.9 kg   SpO2 97%   BMI 30.40 kg/m  Physical Exam Vitals and nursing note reviewed.  Constitutional:      Appearance: He is well-developed.  HENT:     Head: Normocephalic.  Cardiovascular:     Rate and Rhythm: Normal rate.  Pulmonary:     Effort: Pulmonary effort is normal.  Abdominal:     General: There is no distension.  Musculoskeletal:        General: Swelling and tenderness present.     Comments: Large effusion, decreased range of motion neurovascular neurosensory are intact  Skin:    General: Skin is warm.  Neurological:     General: No focal deficit present.     Mental Status: He is alert and oriented to Nolan, place, and time.     ED Results / Procedures / Treatments   Labs (all labs ordered are listed, but only abnormal results are displayed) Labs Reviewed - No data to display  EKG None  Radiology No results found.  Procedures Procedures    Medications Ordered in ED Medications  lidocaine (XYLOCAINE) 2 % (with pres) injection 400 mg (has no administration in time range)    ED Course/ Medical Decision Making/ A&P                                 Medical Decision Making Risk Prescription drug management.   I discussed with provider at Justin Nolan  who will see the patient today at 2 PM.  Patient is advised to arrive at the office at 145.  Patient is in agreement with plan.        Final Clinical Impression(s) / ED Diagnoses Final diagnoses:  Effusion of left knee    Rx / DC Orders ED Discharge Orders     None      An After Visit Summary was printed and given to the patient.    Elson Areas, [place] 04/24/23 1610    Maia Plan, MD 04/24/23 [phone]

## 2023-04-24 NOTE — Telephone Encounter (Signed)
Patient saw [place] today. He wants to know the status of his TKA with Dr.Xu. He states that they have been approval from the [place]. Please call patient.

## 2023-04-24 NOTE — ED Triage Notes (Signed)
Left knee is hurting again. Pain started on Tuesday. Patient says "it started swelling." He says he needs a "knee replacement and I am waiting for it to be scheduled." Says he needs the fluid drained off of it

## 2023-04-28 NOTE — Telephone Encounter (Signed)
done

## 2023-04-30 ENCOUNTER — Other Ambulatory Visit: Payer: Self-pay

## 2023-05-12 ENCOUNTER — Telehealth: Payer: Self-pay | Admitting: Orthopaedic Surgery

## 2023-05-12 NOTE — Telephone Encounter (Signed)
Patient called and wants to know if you could get the fluid off his left knee. He is in severe pain. Is there any way he can get in sooner than next week. CB#[phone]

## 2023-05-13 ENCOUNTER — Ambulatory Visit (INDEPENDENT_AMBULATORY_CARE_PROVIDER_SITE_OTHER): Payer: No Typology Code available for payment source | Admitting: Physician Assistant

## 2023-05-13 ENCOUNTER — Encounter: Payer: Self-pay | Admitting: Physician Assistant

## 2023-05-13 ENCOUNTER — Telehealth: Payer: Self-pay

## 2023-05-13 DIAGNOSIS — M1712 Unilateral primary osteoarthritis, left knee: Secondary | ICD-10-CM

## 2023-05-13 MED ORDER — LIDOCAINE HCL 1 % IJ SOLN
4.0000 mL | INTRAMUSCULAR | Status: AC | PRN
Start: 2023-05-13 — End: 2023-05-13
  Administered 2023-05-13: 4 mL

## 2023-05-13 NOTE — Progress Notes (Signed)
Office Visit Note   Patient: Justin Nolan           Date of Birth: Oct 19, 1960           MRN: [id number] Visit Date: 05/13/2023              Requested by: Jeanine Luz [address] [facility] [place],  [place] [address] PCP: [facility], [facility]  Chief Complaint  Patient presents with   Left Knee - Follow-up      HPI: Justin Nolan is a pleasant 62 year old gentleman who is a patient of Dr. Roda Shutters.  He has a history of end stage osteoarthritis of his left knee.  He had a right knee replacement in 2018 and did quite good.  He is scheduled for left knee replacement in December.  He keeps having recurrent painful effusions.  No new injury.  Requests aspiration today.  States his pain is moderate to severe  Assessment & Plan: Visit Diagnoses: Osteoarthritis left knee  Plan: Micah Flesher forward with an aspiration today.  80 cc of yellow joint fluid was aspirated.  Encouraged him to buy compression sleeve for his knee may help but understands that lower than likely this will continue to reaccumulate.  He did want his surgery moved up and I have spoken with his team and they have sent a message to his surgery scheduler.  He understands I cannot inject steroid because of the proximity to his upcoming surgery  Follow-Up Instructions: No follow-ups on file.   Ortho Exam  Patient is alert, oriented, no adenopathy, well-dressed, normal affect, normal respiratory effort. Examination of his left knee he has significant quad atrophy with very little quad function.  He is neurovascularly intact he has a palpable large effusion but no erythema compartments are soft and compressible walks with an antalgic gait with cane  Imaging: No results found. No images are attached to the encounter.  Labs: Lab Results  Component Value Date   HGBA1C 5.7 (H) 02/09/2012   ESRSEDRATE 3 04/30/2017   CRP 0.8 04/30/2017   REPTSTATUS 03/09/2023 FINAL 03/06/2023   GRAMSTAIN  03/06/2023    ABUNDANT WBC  PRESENT, PREDOMINANTLY PMN NO ORGANISMS SEEN    CULT  03/06/2023    NO GROWTH 3 DAYS Performed at [facility], [address] [place], [place] [address]    LABORGA NO GROWTH 11/29/2014     Lab Results  Component Value Date   ALBUMIN 4.4 04/25/2019   ALBUMIN 4.5 04/30/2017   ALBUMIN 4.6 11/29/2014    Lab Results  Component Value Date   MG 1.0 (L) 10/02/2022   No results found for: "VD25OH"  No results found for: "PREALBUMIN"    Latest Ref Rng & Units 10/02/2022   12:56 PM 04/25/2019    7:48 PM 05/08/2017    4:22 AM  CBC EXTENDED  WBC 4.0 - 10.5 K/uL 8.4  9.7  17.4   RBC 4.22 - 5.81 MIL/uL 4.07  4.07  3.99   Hemoglobin 13.0 - 17.0 g/dL 96.2  95.2  84.1   HCT 39.0 - 52.0 % 40.5  40.4  39.0   Platelets 150 - 400 K/uL 161  200  164   NEUT# 1.7 - 7.7 K/uL 5.1  4.5    Lymph# 0.7 - 4.0 K/uL 2.5  4.2       There is no height or weight on file to calculate BMI.  Orders:  No orders of the defined types were placed in this encounter.  No  orders of the defined types were placed in this encounter.    Procedures: Large Joint Inj: L knee on 05/13/2023 9:05 AM Indications: pain and diagnostic evaluation Details: 18 G 1.5 in needle, superolateral approach  Arthrogram: No  Medications: 4 mL lidocaine 1 % Aspirate: 80 mL yellow Outcome: tolerated well, no immediate complications  After obtaining verbal consent the superior lateral pouch of the left knee was prepped with alcohol and Betadine.  4 cc of lidocaine plain was injected on a 25-gauge needle.  After adequate analgesia an 18-gauge needle was inserted.  80 cc of yellow fluid was aspirated without difficulty.  Band-Aid was applied as well as a compressive wrap patient tolerated the procedure well Procedure, treatment alternatives, risks and benefits explained, specific risks discussed. Consent was given by the patient.    Clinical Data: No additional findings.  ROS:  All other systems negative, except as  noted in the HPI. Review of Systems  Objective: Vital Signs: There were no vitals taken for this visit.  Specialty Comments:  No specialty comments available.  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   Alcohol abuse 02/16/2012   HTN (hypertension) 02/09/2012   GERD (gastroesophageal reflux disease) 02/09/2012   Tobacco use 02/09/2012   Indirect hyperbilirubinemia 02/09/2012   Past Medical History:  Diagnosis Date   Arthritis    BPH (benign prostatic hyperplasia)    GERD (gastroesophageal reflux disease)    Hypertension    Pneumonia 02/09/2012    Family History  Problem Relation Age of Onset   Diabetes Maternal Aunt    Diabetes Paternal Aunt     Past Surgical History:  Procedure Laterality Date   KNEE SURGERY     TOTAL KNEE ARTHROPLASTY Right 05/07/2017   Procedure: RIGHT TOTAL KNEE ARTHROPLASTY;  Surgeon: Tarry Kos, MD;  Location: [facility] OR;  Service: Orthopedics;  Laterality: Right;   Social History   Occupational History   Not on file  Tobacco Use   Smoking status: Every Day    Current packs/day: 0.25    Average packs/day: 0.3 packs/day for 25.0 years (6.3 ttl pk-yrs)    Types: Cigarettes   Smokeless tobacco: Never  Substance and Sexual Activity   Alcohol use: Yes    Alcohol/week: 3.0 standard drinks of alcohol    Types: 3 Shots of liquor per week    Comment: drink occasional   Drug use: No   Sexual activity: Yes

## 2023-05-13 NOTE — Telephone Encounter (Signed)
Patient came in and saw Justin Nolan. He is requesting a sooner surgery date than scheduled for TKA.

## 2023-06-23 NOTE — Progress Notes (Addendum)
Surgical Instructions   Your procedure is scheduled on December 16, 24. Report to [facility] Main Entrance "A" at 7:50 A.M., then check in with the Admitting office. Any questions or running late day of surgery: call [phone]  Questions prior to your surgery date: call [phone], Monday-Friday, 8am-4pm. If you experience any cold or flu symptoms such as cough, fever, chills, shortness of breath, etc. between now and your scheduled surgery, please notify us at the above number.     Remember:  Do not eat after midnight the night before your surgery  You may drink clear liquids until 6:50 the morning of your surgery.   Clear liquids allowed are: Water, Non-Citrus Juices (without pulp), Carbonated Beverages, Clear Tea (no milk, honey, etc.), Black Coffee Only (NO MILK, CREAM OR POWDERED CREAMER of any kind), and Gatorade.    Take these medicines the morning of surgery with A SIP OF WATER   amLODipine (NORVASC)  pantoprazole (PROTONIX)  venlafaxine XR (EFFEXOR-XR)    One week prior to surgery, STOP taking any Aspirin (unless otherwise instructed by your surgeon) Aleve, Naproxen, Ibuprofen, Motrin, Advil, Goody's, BC's, all herbal medications, fish oil, and non-prescription vitamins.          Patient Instructions  The night before surgery:  No food after midnight. ONLY clear liquids after midnight  The day of surgery (if you do NOT have diabetes):  Drink ONE (1) Pre-Surgery Clear Ensure by 6:50 the morning of surgery. Drink in one sitting. Do not sip.  This drink was given to you during your hospital  pre-op appointment visit.  Nothing else to drink after completing the  Pre-Surgery Clear Ensure.         If you have questions, please contact your surgeon's office.              Do NOT Smoke (Tobacco/Vaping) for 24 hours prior to your procedure.  If you use a CPAP at night, you may bring your mask/headgear for your overnight stay.   You will be asked to remove any  contacts, glasses, piercing's, hearing aid's, dentures/partials prior to surgery. Please bring cases for these items if needed.    Patients discharged the day of surgery will not be allowed to drive home, and someone needs to stay with them for 24 hours.  SURGICAL WAITING ROOM VISITATION Patients may have no more than 2 support people in the waiting area - these visitors may rotate.   Pre-op nurse will coordinate an appropriate time for 1 ADULT support person, who may not rotate, to accompany patient in pre-op.  Children under the age of 36 must have an adult with them who is not the patient and must remain in the main waiting area with an adult.  If the patient needs to stay at the hospital during part of their recovery, the visitor guidelines for inpatient rooms apply.  Please refer to the [facility] website for the visitor guidelines for any additional information.   If you received a COVID test during your pre-op visit  it is requested that you wear a mask when out in public, stay away from anyone that may not be feeling well and notify your surgeon if you develop symptoms. If you have been in contact with anyone that has tested positive in the last 10 days please notify you surgeon.      Pre-operative 5 CHG Bathing Instructions   You can play a key role in reducing the risk of infection after surgery. Your skin  needs to be as free of germs as possible. You can reduce the number of germs on your skin by washing with CHG (chlorhexidine gluconate) soap before surgery. CHG is an antiseptic soap that kills germs and continues to kill germs even after washing.   DO NOT use if you have an allergy to chlorhexidine/CHG or antibacterial soaps. If your skin becomes reddened or irritated, stop using the CHG and notify one of our RNs at [phone].   Please shower with the CHG soap starting 4 days before surgery using the following schedule:     Please keep in mind the following:  DO NOT  shave, including legs and underarms, starting the day of your first shower.   You may shave your face at any point before/day of surgery.  Place clean sheets on your bed the day you start using CHG soap. Use a clean washcloth (not used since being washed) for each shower. DO NOT sleep with pets once you start using the CHG.   CHG Shower Instructions:  Wash your face and private area with normal soap. If you choose to wash your hair, wash first with your normal shampoo.  After you use shampoo/soap, rinse your hair and body thoroughly to remove shampoo/soap residue.  Turn the water OFF and apply about 3 tablespoons (45 ml) of CHG soap to a CLEAN washcloth.  Apply CHG soap ONLY FROM YOUR NECK DOWN TO YOUR TOES (washing for 3-5 minutes)  DO NOT use CHG soap on face, private areas, open wounds, or sores.  Pay special attention to the area where your surgery is being performed.  If you are having back surgery, having someone wash your back for you may be helpful. Wait 2 minutes after CHG soap is applied, then you may rinse off the CHG soap.  Pat dry with a clean towel  Put on clean clothes/pajamas   If you choose to wear lotion, please use ONLY the CHG-compatible lotions on the back of this paper.   Additional instructions for the day of surgery: DO NOT APPLY any lotions, deodorants, cologne, or perfumes.   Do not bring valuables to the hospital. [facility] is not responsible for any belongings/valuables. Do not wear nail polish, gel polish, artificial nails, or any other type of covering on natural nails (fingers and toes) Do not wear jewelry or makeup Put on clean/comfortable clothes.  Please brush your teeth.  Ask your nurse before applying any prescription medications to the skin.     CHG Compatible Lotions   Aveeno Moisturizing lotion  Cetaphil Moisturizing Cream  Cetaphil Moisturizing Lotion  Clairol Herbal Essence Moisturizing Lotion, Dry Skin  Clairol Herbal Essence  Moisturizing Lotion, Extra Dry Skin  Clairol Herbal Essence Moisturizing Lotion, Normal Skin  Curel Age Defying Therapeutic Moisturizing Lotion with Alpha Hydroxy  Curel Extreme Care Body Lotion  Curel Soothing Hands Moisturizing Hand Lotion  Curel Therapeutic Moisturizing Cream, Fragrance-Free  Curel Therapeutic Moisturizing Lotion, Fragrance-Free  Curel Therapeutic Moisturizing Lotion, Original Formula  Eucerin Daily Replenishing Lotion  Eucerin Dry Skin Therapy Plus Alpha Hydroxy Crme  Eucerin Dry Skin Therapy Plus Alpha Hydroxy Lotion  Eucerin Original Crme  Eucerin Original Lotion  Eucerin Plus Crme Eucerin Plus Lotion  Eucerin TriLipid Replenishing Lotion  Keri Anti-Bacterial Hand Lotion  Keri Deep Conditioning Original Lotion Dry Skin Formula Softly Scented  Keri Deep Conditioning Original Lotion, Fragrance Free Sensitive Skin Formula  Keri Lotion Fast Absorbing Fragrance Free Sensitive Skin Formula  Keri Lotion Fast Absorbing Softly Scented Dry  Skin Formula  Keri Original Lotion  Keri Skin Renewal Lotion Keri Silky Smooth Lotion  Keri Silky Smooth Sensitive Skin Lotion  Nivea Body Creamy Conditioning Oil  Nivea Body Extra Enriched Lotion  Nivea Body Original Lotion  Nivea Body Sheer Moisturizing Lotion Nivea Crme  Nivea Skin Firming Lotion  NutraDerm 30 Skin Lotion  NutraDerm Skin Lotion  NutraDerm Therapeutic Skin Cream  NutraDerm Therapeutic Skin Lotion  ProShield Protective Hand Cream  Provon moisturizing lotion  Please read over the following fact sheets that you were given.

## 2023-06-24 ENCOUNTER — Other Ambulatory Visit: Payer: Self-pay

## 2023-06-24 ENCOUNTER — Encounter (HOSPITAL_COMMUNITY): Payer: Self-pay

## 2023-06-24 ENCOUNTER — Encounter (HOSPITAL_COMMUNITY)
Admission: RE | Admit: 2023-06-24 | Discharge: 2023-06-24 | Disposition: A | Discharge status: Home / Self Care | Payer: No Typology Code available for payment source | Source: Ambulatory Visit | Attending: Orthopaedic Surgery | Admitting: Orthopaedic Surgery

## 2023-06-24 ENCOUNTER — Other Ambulatory Visit: Payer: Self-pay | Admitting: Physician Assistant

## 2023-06-24 VITALS — BP 133/76 | HR 86 | Temp 98.8°F | Resp 18 | Ht 71.0 in | Wt 214.0 lb

## 2023-06-24 DIAGNOSIS — Z01812 Encounter for preprocedural laboratory examination: Secondary | ICD-10-CM | POA: Diagnosis present

## 2023-06-24 DIAGNOSIS — Z01818 Encounter for other preprocedural examination: Secondary | ICD-10-CM

## 2023-06-24 DIAGNOSIS — M1712 Unilateral primary osteoarthritis, left knee: Secondary | ICD-10-CM | POA: Insufficient documentation

## 2023-06-24 HISTORY — DX: Sleep apnea, unspecified: G47.30

## 2023-06-24 HISTORY — DX: Post-traumatic stress disorder, unspecified: F43.10

## 2023-06-24 HISTORY — DX: Anxiety disorder, unspecified: F41.9

## 2023-06-24 HISTORY — DX: Major depressive disorder, single episode, unspecified: F32.9

## 2023-06-24 LAB — CBC
HCT: 43.7 % (ref 39.0–52.0)
Hemoglobin: 14.7 g/dL (ref 13.0–17.0)
MCH: 33.1 pg (ref 26.0–34.0)
MCHC: 33.6 g/dL (ref 30.0–36.0)
MCV: 98.4 fL (ref 80.0–100.0)
Platelets: 198 10*3/uL (ref 150–400)
RBC: 4.44 MIL/uL (ref 4.22–5.81)
RDW: 12.4 % (ref 11.5–15.5)
WBC: 7.8 10*3/uL (ref 4.0–10.5)
nRBC: 0 % (ref 0.0–0.2)

## 2023-06-24 LAB — BASIC METABOLIC PANEL
Anion gap: 10 (ref 5–15)
BUN: 17 mg/dL (ref 8–23)
CO2: 29 mmol/L (ref 22–32)
Calcium: 8.7 mg/dL — ABNORMAL LOW (ref 8.9–10.3)
Chloride: 100 mmol/L (ref 98–111)
Creatinine, Ser: 1.26 mg/dL — ABNORMAL HIGH (ref 0.61–1.24)
GFR, Estimated: >60 mL/min (ref >60)
Glucose, Bld: 180 mg/dL — ABNORMAL HIGH (ref 70–99)
Potassium: 3.1 mmol/L — ABNORMAL LOW (ref 3.5–5.1)
Sodium: 139 mmol/L (ref 135–145)

## 2023-06-24 LAB — SURGICAL PCR SCREEN
MRSA, PCR: NEGATIVE
Staphylococcus aureus: NEGATIVE

## 2023-06-24 MED ORDER — OXYCODONE-ACETAMINOPHEN 5-325 MG PO TABS: 1.0000 | ORAL_TABLET | Freq: Three times a day (TID) | ORAL | 0 refills | Status: DC | PRN

## 2023-06-24 MED ORDER — DOCUSATE SODIUM 100 MG PO CAPS: 100.0000 mg | ORAL_CAPSULE | Freq: Every day | ORAL | 2 refills | Status: AC | PRN

## 2023-06-24 MED ORDER — ONDANSETRON HCL 4 MG PO TABS: 4.0000 mg | ORAL_TABLET | Freq: Three times a day (TID) | ORAL | 0 refills | Status: AC | PRN

## 2023-06-24 MED ORDER — METHOCARBAMOL 750 MG PO TABS: 750.0000 mg | ORAL_TABLET | Freq: Two times a day (BID) | ORAL | 2 refills | Status: DC | PRN

## 2023-06-24 MED ORDER — RIVAROXABAN 10 MG PO TABS: 10.0000 mg | ORAL_TABLET | Freq: Every day | ORAL | 0 refills | Status: DC

## 2023-06-24 MED ORDER — POTASSIUM CHLORIDE CRYS ER 10 MEQ PO TBCR: EXTENDED_RELEASE_TABLET | ORAL | 0 refills | Status: DC

## 2023-06-24 NOTE — Progress Notes (Signed)
[facility]  Cardiologist - Denies  PPM/ICD - Denies Device Orders - n/a Rep Notified - n/a  Chest x-ray - n/a EKG - 10-02-22 Stress Test - Denies ECHO - pt states not sure but greater than 15 years ago Cardiac Cath - Denies  Sleep Study - Y CPAP - No per patient he does not need one.  Fasting Blood Sugar - Denies Checks Blood Sugar _____ times a day. N/A  Last dose of GLP1 agonist-  Denies GLP1 instructions: N/A  Blood Thinner Instructions: N Aspirin Instructions: does not take but instructed not to take per instructions  ERAS Protcol - Y PRE-SURGERY Ensure or G2- Ensure  COVID TEST- N   Anesthesia review:  N  Patient denies shortness of breath, fever, cough and chest pain at PAT appointment. Patient denies any respiratory issues at this time    All instructions explained to the patient, with a verbal understanding of the material. Patient agrees to go over the instructions while at home for a better understanding. Patient also instructed to self quarantine after being tested for COVID-19. The opportunity to ask questions was provided.

## 2023-06-24 NOTE — Progress Notes (Signed)
Can you please call patient and let him know he has low potassium and that I have sent in supplements to start taking asap

## 2023-06-24 NOTE — Progress Notes (Signed)
ok 

## 2023-06-26 MED ORDER — TRANEXAMIC ACID 1000 MG/10ML IV SOLN
2000.0000 mg | INTRAVENOUS | Status: DC
  Filled 2023-06-26: qty 20

## 2023-06-28 NOTE — Anesthesia Preprocedure Evaluation (Signed)
Anesthesia Evaluation  Patient identified by MRN, date of birth, ID band Patient awake    Reviewed: Allergy & Precautions, NPO status , Patient's Chart, lab work & pertinent test results  Airway Mallampati: I  TM Distance: >3 FB Neck ROM: Full    Dental no notable dental hx. (+) Teeth Intact, Dental Advisory Given   Pulmonary sleep apnea (does not wear CPAP) , Current SmokerPatient did not abstain from smoking.   Pulmonary exam normal breath sounds clear to auscultation       Cardiovascular hypertension, Pt. on medications Normal cardiovascular exam Rhythm:Regular Rate:Normal     Neuro/Psych  PSYCHIATRIC DISORDERS Anxiety Depression    negative neurological ROS     GI/Hepatic ,GERD  Medicated,,(+)     substance abuse  alcohol use  Endo/Other  negative endocrine ROS    Renal/GU negative Renal ROS  negative genitourinary   Musculoskeletal negative musculoskeletal ROS (+)    Abdominal   Peds  Hematology negative hematology ROS (+)   Anesthesia Other Findings   Reproductive/Obstetrics                             Anesthesia Physical Anesthesia Plan  ASA: 3  Anesthesia Plan: Spinal and Regional   Post-op Pain Management: Regional block* and Tylenol PO (pre-op)*   Induction:   PONV Risk Score and Plan: 1 and Treatment may vary due to age or medical condition, Propofol infusion, Ondansetron, Dexamethasone and Midazolam  Airway Management Planned: Natural Airway  Additional Equipment:   Intra-op Plan:   Post-operative Plan:   Informed Consent: I have reviewed the patients History and Physical, chart, labs and discussed the procedure including the risks, benefits and alternatives for the proposed anesthesia with the patient or authorized representative who has indicated his/her understanding and acceptance.     Dental advisory given  Plan Discussed with: CRNA  Anesthesia Plan  Comments:        Anesthesia Quick Evaluation

## 2023-06-29 ENCOUNTER — Observation Stay (HOSPITAL_COMMUNITY): Payer: No Typology Code available for payment source

## 2023-06-29 ENCOUNTER — Ambulatory Visit (HOSPITAL_COMMUNITY): Payer: No Typology Code available for payment source | Admitting: Anesthesiology

## 2023-06-29 ENCOUNTER — Observation Stay (HOSPITAL_COMMUNITY)
Admission: RE | Admit: 2023-06-29 | Discharge: 2023-06-29 | Disposition: A | Discharge status: Home / Self Care | Payer: No Typology Code available for payment source | Attending: Orthopaedic Surgery | Admitting: Orthopaedic Surgery

## 2023-06-29 ENCOUNTER — Other Ambulatory Visit: Payer: Self-pay

## 2023-06-29 ENCOUNTER — Encounter (HOSPITAL_COMMUNITY)
Admission: RE | Disposition: A | Discharge status: Home / Self Care | Payer: Self-pay | Source: Home / Self Care | Attending: Orthopaedic Surgery

## 2023-06-29 DIAGNOSIS — I10 Essential (primary) hypertension: Secondary | ICD-10-CM | POA: Insufficient documentation

## 2023-06-29 DIAGNOSIS — F17210 Nicotine dependence, cigarettes, uncomplicated: Secondary | ICD-10-CM | POA: Diagnosis not present

## 2023-06-29 DIAGNOSIS — M1712 Unilateral primary osteoarthritis, left knee: Principal | ICD-10-CM | POA: Insufficient documentation

## 2023-06-29 DIAGNOSIS — Z96651 Presence of right artificial knee joint: Secondary | ICD-10-CM | POA: Diagnosis not present

## 2023-06-29 DIAGNOSIS — Z7901 Long term (current) use of anticoagulants: Secondary | ICD-10-CM | POA: Insufficient documentation

## 2023-06-29 DIAGNOSIS — Z96652 Presence of left artificial knee joint: Secondary | ICD-10-CM

## 2023-06-29 DIAGNOSIS — Z79899 Other long term (current) drug therapy: Secondary | ICD-10-CM | POA: Insufficient documentation

## 2023-06-29 HISTORY — PX: TOTAL KNEE ARTHROPLASTY: SHX125

## 2023-06-29 SURGERY — ARTHROPLASTY, KNEE, TOTAL
Anesthesia: Regional | Site: Knee | Laterality: Left

## 2023-06-29 MED ORDER — OXYCODONE HCL ER 10 MG PO T12A
10.0000 mg | EXTENDED_RELEASE_TABLET | Freq: Two times a day (BID) | ORAL | Status: DC
Start: 2023-06-29 — End: 2023-06-29
  Administered 2023-06-29: 10 mg via ORAL
  Filled 2023-06-29: qty 1

## 2023-06-29 MED ORDER — OXYCODONE HCL 5 MG PO TABS
5.0000 mg | ORAL_TABLET | ORAL | Status: DC | PRN
  Filled 2023-06-29: qty 2

## 2023-06-29 MED ORDER — TRANEXAMIC ACID 1000 MG/10ML IV SOLN
INTRAVENOUS | Status: DC | PRN
  Administered 2023-06-29: 2000 mg via TOPICAL

## 2023-06-29 MED ORDER — 0.9 % SODIUM CHLORIDE (POUR BTL) OPTIME
TOPICAL | Status: DC | PRN
  Administered 2023-06-29: 1000 mL

## 2023-06-29 MED ORDER — BUPIVACAINE-MELOXICAM ER 400-12 MG/14ML IJ SOLN
INTRAMUSCULAR | Status: AC
  Filled 2023-06-29: qty 1

## 2023-06-29 MED ORDER — LISINOPRIL 10 MG PO TABS
10.0000 mg | ORAL_TABLET | Freq: Every day | ORAL | Status: DC
Start: 2023-06-29 — End: 2023-06-29
  Administered 2023-06-29: 10 mg via ORAL
  Filled 2023-06-29: qty 1

## 2023-06-29 MED ORDER — AMLODIPINE BESYLATE 5 MG PO TABS: 7.5000 mg | ORAL_TABLET | Freq: Every day | ORAL | Status: DC

## 2023-06-29 MED ORDER — VENLAFAXINE HCL ER 150 MG PO CP24: 150.0000 mg | ORAL_CAPSULE | Freq: Every day | ORAL | Status: DC | PRN

## 2023-06-29 MED ORDER — BUPIVACAINE IN DEXTROSE 0.75-8.25 % IT SOLN
INTRATHECAL | Status: DC | PRN
  Administered 2023-06-29: 1.6 mL via INTRATHECAL

## 2023-06-29 MED ORDER — ACETAMINOPHEN 325 MG PO TABS: 325.0000 mg | ORAL_TABLET | Freq: Four times a day (QID) | ORAL | Status: DC | PRN

## 2023-06-29 MED ORDER — KETOROLAC TROMETHAMINE 15 MG/ML IJ SOLN
15.0000 mg | Freq: Four times a day (QID) | INTRAMUSCULAR | Status: DC
  Administered 2023-06-29: 15 mg via INTRAVENOUS
  Filled 2023-06-29: qty 1

## 2023-06-29 MED ORDER — METHOCARBAMOL 500 MG PO TABS: 500.0000 mg | ORAL_TABLET | Freq: Four times a day (QID) | ORAL | Status: DC | PRN

## 2023-06-29 MED ORDER — ROPIVACAINE HCL 5 MG/ML IJ SOLN
INTRAMUSCULAR | Status: DC | PRN
  Administered 2023-06-29: 20 mL via PERINEURAL

## 2023-06-29 MED ORDER — METOCLOPRAMIDE HCL 5 MG/ML IJ SOLN: 5.0000 mg | Freq: Three times a day (TID) | INTRAMUSCULAR | Status: DC | PRN

## 2023-06-29 MED ORDER — MIDAZOLAM HCL 2 MG/2ML IJ SOLN: 2.0000 mg | Freq: Once | INTRAMUSCULAR | Status: DC

## 2023-06-29 MED ORDER — FERROUS SULFATE 325 (65 FE) MG PO TABS
325.0000 mg | ORAL_TABLET | Freq: Three times a day (TID) | ORAL | Status: DC
  Administered 2023-06-29: 325 mg via ORAL
  Filled 2023-06-29: qty 1

## 2023-06-29 MED ORDER — PRONTOSAN WOUND IRRIGATION OPTIME
TOPICAL | Status: DC | PRN
  Administered 2023-06-29: 1 via TOPICAL

## 2023-06-29 MED ORDER — FENTANYL CITRATE (PF) 100 MCG/2ML IJ SOLN: 25.0000 ug | INTRAMUSCULAR | Status: DC | PRN

## 2023-06-29 MED ORDER — VANCOMYCIN HCL 1000 MG IV SOLR
INTRAVENOUS | Status: AC
  Filled 2023-06-29: qty 20

## 2023-06-29 MED ORDER — ONDANSETRON HCL 4 MG/2ML IJ SOLN
4.0000 mg | Freq: Four times a day (QID) | INTRAMUSCULAR | Status: DC | PRN
Start: 2023-06-29 — End: 2023-06-29

## 2023-06-29 MED ORDER — METOCLOPRAMIDE HCL 5 MG PO TABS: 5.0000 mg | ORAL_TABLET | Freq: Three times a day (TID) | ORAL | Status: DC | PRN

## 2023-06-29 MED ORDER — TRANEXAMIC ACID-NACL 1000-0.7 MG/100ML-% IV SOLN
1000.0000 mg | Freq: Once | INTRAVENOUS | Status: AC
  Administered 2023-06-29: 1000 mg via INTRAVENOUS
  Filled 2023-06-29: qty 100

## 2023-06-29 MED ORDER — MENTHOL 3 MG MT LOZG: 1.0000 | LOZENGE | OROMUCOSAL | Status: DC | PRN

## 2023-06-29 MED ORDER — DEXAMETHASONE SODIUM PHOSPHATE 10 MG/ML IJ SOLN
INTRAMUSCULAR | Status: DC | PRN
  Administered 2023-06-29: 10 mg via INTRAVENOUS

## 2023-06-29 MED ORDER — ACETAMINOPHEN 500 MG PO TABS
1000.0000 mg | ORAL_TABLET | Freq: Four times a day (QID) | ORAL | Status: DC
  Administered 2023-06-29: 1000 mg via ORAL
  Filled 2023-06-29: qty 2

## 2023-06-29 MED ORDER — TRANEXAMIC ACID-NACL 1000-0.7 MG/100ML-% IV SOLN
1000.0000 mg | INTRAVENOUS | Status: DC
  Filled 2023-06-29: qty 100

## 2023-06-29 MED ORDER — HYDRALAZINE HCL 20 MG/ML IJ SOLN
INTRAMUSCULAR | Status: AC
  Administered 2023-06-29: 5 mg via INTRAVENOUS
  Filled 2023-06-29: qty 1

## 2023-06-29 MED ORDER — PHENOL 1.4 % MT LIQD: 1.0000 | OROMUCOSAL | Status: DC | PRN

## 2023-06-29 MED ORDER — DOCUSATE SODIUM 100 MG PO CAPS
100.0000 mg | ORAL_CAPSULE | Freq: Two times a day (BID) | ORAL | Status: DC
  Administered 2023-06-29: 100 mg via ORAL
  Filled 2023-06-29: qty 1

## 2023-06-29 MED ORDER — CEFAZOLIN SODIUM-DEXTROSE 2-4 GM/100ML-% IV SOLN
2.0000 g | Freq: Four times a day (QID) | INTRAVENOUS | Status: DC
  Administered 2023-06-29: 2 g via INTRAVENOUS
  Filled 2023-06-29: qty 100

## 2023-06-29 MED ORDER — ORAL CARE MOUTH RINSE: 15.0000 mL | Freq: Once | OROMUCOSAL | Status: AC

## 2023-06-29 MED ORDER — MIDAZOLAM HCL 2 MG/2ML IJ SOLN
INTRAMUSCULAR | Status: AC
Start: 2023-06-29 — End: ?
  Filled 2023-06-29: qty 2

## 2023-06-29 MED ORDER — POVIDONE-IODINE 10 % EX SWAB
2.0000 | Freq: Once | CUTANEOUS | Status: AC
  Administered 2023-06-29: 2 via TOPICAL

## 2023-06-29 MED ORDER — VANCOMYCIN HCL 1000 MG IV SOLR
INTRAVENOUS | Status: DC | PRN
  Administered 2023-06-29: 1000 mg

## 2023-06-29 MED ORDER — FENTANYL CITRATE (PF) 100 MCG/2ML IJ SOLN: 50.0000 ug | Freq: Once | INTRAMUSCULAR | Status: DC

## 2023-06-29 MED ORDER — DEXAMETHASONE SODIUM PHOSPHATE 10 MG/ML IJ SOLN: 10.0000 mg | Freq: Once | INTRAMUSCULAR | Status: DC

## 2023-06-29 MED ORDER — SODIUM CHLORIDE 0.9 % IR SOLN
Status: DC | PRN
  Administered 2023-06-29: 1000 mL

## 2023-06-29 MED ORDER — HYDRALAZINE HCL 20 MG/ML IJ SOLN
5.0000 mg | Freq: Once | INTRAMUSCULAR | Status: AC
  Administered 2023-06-29: 5 mg via INTRAVENOUS

## 2023-06-29 MED ORDER — MIDAZOLAM HCL 2 MG/2ML IJ SOLN
INTRAMUSCULAR | Status: AC
  Filled 2023-06-29: qty 2

## 2023-06-29 MED ORDER — RIVAROXABAN 10 MG PO TABS
10.0000 mg | ORAL_TABLET | Freq: Every day | ORAL | Status: DC
  Filled 2023-06-29: qty 1

## 2023-06-29 MED ORDER — FENTANYL CITRATE (PF) 100 MCG/2ML IJ SOLN
INTRAMUSCULAR | Status: AC
  Administered 2023-06-29: 50 ug
  Filled 2023-06-29: qty 2

## 2023-06-29 MED ORDER — DEXAMETHASONE SODIUM PHOSPHATE 10 MG/ML IJ SOLN
INTRAMUSCULAR | Status: DC | PRN
  Administered 2023-06-29: 10 mg

## 2023-06-29 MED ORDER — TRANEXAMIC ACID-NACL 1000-0.7 MG/100ML-% IV SOLN
INTRAVENOUS | Status: AC
  Filled 2023-06-29: qty 100

## 2023-06-29 MED ORDER — CHLORHEXIDINE GLUCONATE 0.12 % MT SOLN
15.0000 mL | Freq: Once | OROMUCOSAL | Status: AC
  Administered 2023-06-29: 15 mL via OROMUCOSAL

## 2023-06-29 MED ORDER — OXYCODONE HCL 5 MG PO TABS
10.0000 mg | ORAL_TABLET | ORAL | Status: DC | PRN
  Administered 2023-06-29: 10 mg via ORAL

## 2023-06-29 MED ORDER — HYDRALAZINE HCL 20 MG/ML IJ SOLN
5.0000 mg | Freq: Once | INTRAMUSCULAR | Status: AC
Start: 2023-06-29 — End: 2023-06-29

## 2023-06-29 MED ORDER — ONDANSETRON HCL 4 MG PO TABS: 4.0000 mg | ORAL_TABLET | Freq: Four times a day (QID) | ORAL | Status: DC | PRN

## 2023-06-29 MED ORDER — SODIUM CHLORIDE 0.9 % IV SOLN: INTRAVENOUS | Status: DC | PRN

## 2023-06-29 MED ORDER — BUPIVACAINE-MELOXICAM ER 400-12 MG/14ML IJ SOLN
INTRAMUSCULAR | Status: DC | PRN
  Administered 2023-06-29: 400 mg

## 2023-06-29 MED ORDER — HYDROMORPHONE HCL 1 MG/ML IJ SOLN: 0.5000 mg | INTRAMUSCULAR | Status: DC | PRN

## 2023-06-29 MED ORDER — PROPOFOL 10 MG/ML IV BOLUS
INTRAVENOUS | Status: AC
  Filled 2023-06-29: qty 20

## 2023-06-29 MED ORDER — METHOCARBAMOL 1000 MG/10ML IJ SOLN: 500.0000 mg | Freq: Four times a day (QID) | INTRAMUSCULAR | Status: DC | PRN

## 2023-06-29 MED ORDER — ACETAMINOPHEN 500 MG PO TABS
1000.0000 mg | ORAL_TABLET | Freq: Once | ORAL | Status: AC
  Administered 2023-06-29: 1000 mg via ORAL
  Filled 2023-06-29: qty 2

## 2023-06-29 MED ORDER — CEFAZOLIN SODIUM-DEXTROSE 2-4 GM/100ML-% IV SOLN
2.0000 g | INTRAVENOUS | Status: AC
  Administered 2023-06-29: 2 g via INTRAVENOUS
  Filled 2023-06-29: qty 100

## 2023-06-29 MED ORDER — PROPOFOL 500 MG/50ML IV EMUL
INTRAVENOUS | Status: DC | PRN
  Administered 2023-06-29: 75 ug/kg/min via INTRAVENOUS

## 2023-06-29 SURGICAL SUPPLY — 75 items
ALCOHOL 70% 16 OZ (MISCELLANEOUS) ×1 IMPLANT
BAG COUNTER SPONGE SURGICOUNT (BAG) IMPLANT
BAG DECANTER FOR FLEXI CONT (MISCELLANEOUS) ×1 IMPLANT
BANDAGE ESMARK 6X9 LF (GAUZE/BANDAGES/DRESSINGS) IMPLANT
BLADE SAG 18X100X1.27 (BLADE) ×1 IMPLANT
BLADE SAW SGTL 73X25 THK (BLADE) ×1 IMPLANT
BNDG ESMARK 6X9 LF (GAUZE/BANDAGES/DRESSINGS)
BOWL SMART MIX CTS (DISPOSABLE) ×1 IMPLANT
CLSR STERI-STRIP ANTIMIC 1/2X4 (GAUZE/BANDAGES/DRESSINGS) ×2 IMPLANT
COMP FEM KNEE STD PS 9 LT (Joint) ×1 IMPLANT
COMP PATELLA 3 PEG 35 (Joint) ×1 IMPLANT
COMP TIB KNEE PS 0D LT (Joint) ×1 IMPLANT
COMPONENT FEM KNEE STD PS 9 LT (Joint) IMPLANT
COMPONENT PATELLA 3 PEG 35 (Joint) IMPLANT
COMPONENT TIB KNEE PS 0D LT (Joint) IMPLANT
COOLER ICEMAN CLASSIC (MISCELLANEOUS) ×1 IMPLANT
COVER SURGICAL LIGHT HANDLE (MISCELLANEOUS) ×1 IMPLANT
CUFF TOURN SGL QUICK 42 (TOURNIQUET CUFF) IMPLANT
CUFF TRNQT CYL 34X4.125X (TOURNIQUET CUFF) ×1 IMPLANT
DERMABOND ADVANCED .7 DNX12 (GAUZE/BANDAGES/DRESSINGS) ×1 IMPLANT
DRAPE EXTREMITY T 121X128X90 (DISPOSABLE) ×1 IMPLANT
DRAPE HALF SHEET 40X57 (DRAPES) ×1 IMPLANT
DRAPE INCISE IOBAN 66X45 STRL (DRAPES) ×1 IMPLANT
DRAPE POUCH INSTRU U-SHP 10X18 (DRAPES) ×1 IMPLANT
DRAPE SURG ORHT 6 SPLT 77X108 (DRAPES) IMPLANT
DRAPE U-SHAPE 47X51 STRL (DRAPES) ×2 IMPLANT
DRSG AQUACEL AG ADV 3.5X10 (GAUZE/BANDAGES/DRESSINGS) ×1 IMPLANT
DURAPREP 26ML APPLICATOR (WOUND CARE) ×3 IMPLANT
ELECT CAUTERY BLADE 6.4 (BLADE) ×1 IMPLANT
ELECT PENCIL ROCKER SW 15FT (MISCELLANEOUS) ×1 IMPLANT
ELECT REM PT RETURN 9FT ADLT (ELECTROSURGICAL) ×1
ELECTRODE REM PT RTRN 9FT ADLT (ELECTROSURGICAL) ×1 IMPLANT
GLOVE BIOGEL PI IND STRL 7.0 (GLOVE) ×2 IMPLANT
GLOVE BIOGEL PI IND STRL 7.5 (GLOVE) ×5 IMPLANT
GLOVE ECLIPSE 7.0 STRL STRAW (GLOVE) ×3 IMPLANT
GLOVE INDICATOR 7.0 STRL GRN (GLOVE) ×1 IMPLANT
GLOVE INDICATOR 7.5 STRL GRN (GLOVE) ×1 IMPLANT
GLOVE SURG SYN 7.5 E (GLOVE) ×2 IMPLANT
GLOVE SURG SYN 7.5 PF PI (GLOVE) ×2 IMPLANT
GLOVE SURG UNDER LTX SZ7.5 (GLOVE) ×2 IMPLANT
GLOVE SURG UNDER POLY LF SZ7 (GLOVE) ×2 IMPLANT
GOWN STRL REUS W/ TWL LRG LVL3 (GOWN DISPOSABLE) ×1 IMPLANT
GOWN STRL SURGICAL XL XLNG (GOWN DISPOSABLE) ×1 IMPLANT
GOWN TOGA ZIPPER T7+ PEEL AWAY (MISCELLANEOUS) ×2 IMPLANT
HOOD PEEL AWAY T7 (MISCELLANEOUS) ×1 IMPLANT
INSERT MED AS PERS SZ 8-11 LT (Insert) IMPLANT
KIT BASIN OR (CUSTOM PROCEDURE TRAY) ×1 IMPLANT
KIT TURNOVER KIT B (KITS) ×1 IMPLANT
MANIFOLD NEPTUNE II (INSTRUMENTS) ×1 IMPLANT
MARKER SKIN DUAL TIP RULER LAB (MISCELLANEOUS) ×2 IMPLANT
NDL SPNL 18GX3.5 QUINCKE PK (NEEDLE) ×1 IMPLANT
NEEDLE SPNL 18GX3.5 QUINCKE PK (NEEDLE) ×1 IMPLANT
NS IRRIG 1000ML POUR BTL (IV SOLUTION) ×1 IMPLANT
PACK TOTAL JOINT (CUSTOM PROCEDURE TRAY) ×1 IMPLANT
PAD ARMBOARD 7.5X6 YLW CONV (MISCELLANEOUS) ×2 IMPLANT
PAD COLD SHLDR WRAP-ON (PAD) ×1 IMPLANT
PIN DRILL HDLS TROCAR 75 4PK (PIN) IMPLANT
SCREW FEMALE HEX FIX 25X2.5 (ORTHOPEDIC DISPOSABLE SUPPLIES) IMPLANT
SET HNDPC FAN SPRY TIP SCT (DISPOSABLE) ×1 IMPLANT
SLEEVE SCD COMPRESS KNEE MED (STOCKING) IMPLANT
SOLUTION PRONTOSAN WOUND 350ML (IRRIGATION / IRRIGATOR) ×1 IMPLANT
STAPLER VISISTAT 35W (STAPLE) IMPLANT
SUCTION TUBE FRAZIER 10FR DISP (SUCTIONS) ×1 IMPLANT
SUT ETHILON 2 0 FS 18 (SUTURE) IMPLANT
SUT MNCRL AB 3-0 PS2 27 (SUTURE) IMPLANT
SUT VIC AB 0 CT1 27XBRD ANBCTR (SUTURE) ×2 IMPLANT
SUT VIC AB 1 CTX 27 (SUTURE) ×3 IMPLANT
SUT VIC AB 2-0 CT1 TAPERPNT 27 (SUTURE) ×4 IMPLANT
SYR 50ML LL SCALE MARK (SYRINGE) ×2 IMPLANT
TOWEL GREEN STERILE (TOWEL DISPOSABLE) ×1 IMPLANT
TOWEL GREEN STERILE FF (TOWEL DISPOSABLE) ×1 IMPLANT
TRAY CATH INTERMITTENT SS 16FR (CATHETERS) IMPLANT
TUBE SUCT ARGYLE STRL (TUBING) ×1 IMPLANT
UNDERPAD 30X36 HEAVY ABSORB (UNDERPADS AND DIAPERS) ×1 IMPLANT
YANKAUER SUCT BULB TIP NO VENT (SUCTIONS) ×2 IMPLANT

## 2023-06-29 NOTE — Anesthesia Procedure Notes (Signed)
Spinal  Patient location during procedure: OR Start time: 06/29/2023 10:15 AM End time: 06/29/2023 10:18 AM Reason for block: surgical anesthesia Staffing Performed: anesthesiologist  Anesthesiologist: Elmer Picker, MD Performed by: Elmer Picker, MD Authorized by: Elmer Picker, MD   Preanesthetic Checklist Completed: patient identified, IV checked, risks and benefits discussed, surgical consent, monitors and equipment checked, pre-op evaluation and timeout performed Spinal Block Patient position: sitting Prep: DuraPrep and site prepped and draped Patient monitoring: cardiac monitor, continuous pulse ox and blood pressure Approach: midline Location: L3-4 Injection technique: single-shot Needle Needle type: Pencan  Needle gauge: 24 G Needle length: 9 cm Assessment Sensory level: T6 Events: CSF return Additional Notes Functioning IV was confirmed and monitors were applied. Sterile prep and drape, including hand hygiene and sterile gloves were used. The patient was positioned and the spine was prepped. The skin was anesthetized with lidocaine.  Free flow of clear CSF was obtained prior to injecting local anesthetic into the CSF.  The spinal needle aspirated freely following injection.  The needle was carefully withdrawn.  The patient tolerated the procedure well.

## 2023-06-29 NOTE — Plan of Care (Signed)
  Problem: Education: Goal: Knowledge of General Education information will improve Description: Including pain rating scale, medication(s)/side effects and non-pharmacologic comfort measures Outcome: Completed/Met   Problem: Health Behavior/Discharge Planning: Goal: Ability to manage health-related needs will improve Outcome: Completed/Met   Problem: Clinical Measurements: Goal: Ability to maintain clinical measurements within normal limits will improve Outcome: Completed/Met Goal: Will remain free from infection Outcome: Completed/Met Goal: Diagnostic test results will improve Outcome: Completed/Met Goal: Respiratory complications will improve Outcome: Completed/Met Goal: Cardiovascular complication will be avoided Outcome: Completed/Met   Problem: Activity: Goal: Risk for activity intolerance will decrease Outcome: Completed/Met   Problem: Nutrition: Goal: Adequate nutrition will be maintained Outcome: Completed/Met   Problem: Coping: Goal: Level of anxiety will decrease Outcome: Completed/Met   Problem: Elimination: Goal: Will not experience complications related to bowel motility Outcome: Completed/Met Goal: Will not experience complications related to urinary retention Outcome: Completed/Met   Problem: Pain Management: Goal: General experience of comfort will improve Outcome: Completed/Met   Problem: Safety: Goal: Ability to remain free from injury will improve Outcome: Completed/Met   Problem: Skin Integrity: Goal: Risk for impaired skin integrity will decrease Outcome: Completed/Met   Problem: Education: Goal: Knowledge of the prescribed therapeutic regimen will improve Outcome: Completed/Met Goal: Individualized Educational Video(s) Outcome: Completed/Met   Problem: Activity: Goal: Ability to avoid complications of mobility impairment will improve Outcome: Completed/Met Goal: Range of joint motion will improve Outcome: Completed/Met   Problem:  Clinical Measurements: Goal: Postoperative complications will be avoided or minimized Outcome: Completed/Met   Problem: Pain Management: Goal: Pain level will decrease with appropriate interventions Outcome: Completed/Met   Problem: Skin Integrity: Goal: Will show signs of wound healing Outcome: Completed/Met

## 2023-06-29 NOTE — Anesthesia Procedure Notes (Signed)
Anesthesia Regional Block: Adductor canal block   Pre-Anesthetic Checklist: , timeout performed,  Correct Patient, Correct Site, Correct Laterality,  Correct Procedure, Correct Position, site marked,  Risks and benefits discussed,  Pre-op evaluation,  At surgeon's request and post-op pain management  Laterality: Left  Prep: Maximum Sterile Barrier Precautions used, chloraprep       Needles:  Injection technique: Single-shot  Needle Type: Echogenic Stimulator Needle     Needle Length: 9cm  Needle Gauge: 21     Additional Needles:   Procedures:,,,, ultrasound used (permanent image in chart),,    Narrative:  Start time: 06/29/2023 9:15 AM End time: 06/29/2023 9:17 AM Injection made incrementally with aspirations every 5 mL. Anesthesiologist: Elmer Picker, MD

## 2023-06-29 NOTE — Op Note (Signed)
Total Knee Arthroplasty Procedure Note  Preoperative diagnosis: Left knee osteoarthritis  Postoperative diagnosis:same  Operative findings: Complete loss of articular cartilage from patella and medial femoral condyle Patella alta  Operative procedure: Left total knee arthroplasty. CPT [phone]  Surgeon: N. Glee Arvin, MD  Assist: Oneal Grout, PA-C; necessary for the timely completion of procedure and due to complexity of procedure.  Anesthesia: Spinal, regional  Tourniquet time: see anesthesia record  Implants used: Zimmer persona press fit Femur: CR 9 Tibia: F Patella: 35 mm Polyethylene: 11 mm medial congruent  Indication: Justin Nolan is a 62 y.o. year old male with a history of knee pain. Having failed conservative management, the patient elected to proceed with a total knee arthroplasty.  We have reviewed the risk and benefits of the surgery and they elected to proceed after voicing understanding.  Procedure:  After informed consent was obtained and understanding of the risk were voiced including but not limited to bleeding, infection, damage to surrounding structures including nerves and vessels, blood clots, leg length inequality and the failure to achieve desired results, the operative extremity was marked with verbal confirmation of the patient in the holding area.   The patient was then brought to the operating room and transported to the operating room table in the supine position.  A tourniquet was applied to the operative extremity around the upper thigh. The operative limb was then prepped and draped in the usual sterile fashion and preoperative antibiotics were administered.  A time out was performed prior to the start of surgery confirming the correct extremity, preoperative antibiotic administration, as well as team members, implants and instruments available for the case. Correct surgical site was also confirmed with preoperative radiographs. The limb  was then elevated for exsanguination and the tourniquet was inflated. A midline incision was made and a standard medial parapatellar approach was performed.  The patella was everted which showed complete loss of articular cartilage.  The patella was prepared and sized to a 35 mm.  A cover was placed on the patella for protection from retractors.  We then turned our attention to the femur.  The ACL was sacrificed. Start site was drilled in the femur and the intramedullary distal femoral cutting guide was placed, set at 5 degrees valgus, taking 10 mm of distal resection. The distal cut was made. Osteophytes were then removed.   Next, the proximal tibial cutting guide was placed with appropriate slope, varus/valgus alignment and depth of resection. A drop rod was attached to confirm that it was pointed to the second metatarsal.  The proximal tibial cut was made taking 4 mm off the low side. Gap blocks were then used to assess the extension gap and alignment, and appropriate soft tissue releases were performed. Attention was turned back to the femur, which was sized using the sizing guide to a size 9. Appropriate rotation of the femoral component was determined using epicondylar axis, Whiteside's line, and assessing the flexion gap under ligament tension. The appropriate size 4-in-1 cutting block was placed and cuts were made.  Posterior femoral osteophytes and uncapped bone were then removed with the curved osteotome.  Trial components were placed, and stability was checked in full extension, mid-flexion, and deep flexion.  The PCL was retained because the gaps were balanced.  The patella tracked well without a lateral release. Trial components were then removed and tibial preparation performed.  The tibial bone quality was excellent.   The tibial trial was pointed to the medial  third of the tibial tubercle.  The tibia was sized for a size F component.  The bony surfaces were irrigated with a pulse lavage and then  dried. Final components were placed.  The stability of the construct was re-evaluated throughout a range of motion and found to be acceptable. The trial liner was removed, the knee was copiously irrigated, and the knee was re-evaluated for any excess bone debris. The real polyethylene liner, 11 mm thick, was inserted and checked to ensure the locking mechanism had engaged appropriately. The tourniquet was deflated and hemostasis was achieved. The wound was irrigated with normal saline.  One gram of vancomycin powder was placed in the surgical bed.  Topical mixture of 0.25% bupivacaine and meloxicam was placed in the joint for postoperative pain.  Capsular closure was performed with a #1 vicryl in flexion, subcutaneous fat closed with a 0 vicryl suture, then subcutaneous tissue closed with interrupted 2.0 vicryl suture. The skin was then closed with a 3.0 monocryl and steri strips. A sterile dressing was applied.  The patient was awakened in the operating room and taken to recovery in stable condition. All sponge, needle, and instrument counts were correct at the end of the case.  Tessa Lerner was necessary for opening, closing, retracting, limb positioning and overall facilitation and completion of the surgery.  Position: supine  Complications: none.  Time Out: performed   Drains/Packing: none  Estimated blood loss: minimal  Returned to Recovery Room: in good condition.   Antibiotics: yes   Mechanical VTE (DVT) Prophylaxis: sequential compression devices, TED thigh-high  Chemical VTE (DVT) Prophylaxis: xarelto  Fluid Replacement  Crystalloid: see anesthesia record Blood: none  FFP: none   Specimens Removed: 1 to pathology   Sponge and Instrument Count Correct? yes   PACU: portable radiograph - knee AP and Lateral   Plan/RTC: Return in 2 weeks for wound check.   Weight Bearing/Load Lower Extremity: full   Implant Name Type Inv. Item Serial No. Manufacturer Lot No. LRB No. Used  Action  COMP TIB KNEE PS 0D LT - [id number] Joint COMP TIB KNEE PS 0D LT  ZIMMER RECON(ORTH,TRAU,BIO,SG) [id number] Left 1 Implanted  COMP FEM KNEE STD PS 9 LT - [id number] Joint COMP FEM KNEE STD PS 9 LT  ZIMMER [employer]ON(ORTH,TRAU,BIO,SG) [id number] Left 1 Implanted  COMP PATELLA 3 PEG 35 - [id number] Joint COMP PATELLA 3 PEG 35  ZIMMER RECON(ORTH,TRAU,BIO,SG) [id number] Left 1 Implanted  INSERT MED AS PERS SZ 8-11 LT - [id number] Insert INSERT MED AS PERS SZ 8-11 LT  ZIMMER RECON(ORTH,TRAU,BIO,SG) [id number] Left 1 Implanted    N. Glee Arvin, MD [facility] 11:37 AM

## 2023-06-29 NOTE — Progress Notes (Signed)
Patient alert and oriented, void , ambulate. Surgical site clean and dry no sign of infection. D/c instructions explain and given to the patient all questions answered. Pt. D/c home per order.

## 2023-06-29 NOTE — Discharge Instructions (Signed)

## 2023-06-29 NOTE — Progress Notes (Signed)
Pt is pre-arranged for  home health therapy with [facility] from his MD office. Information on the AVS.  DME for home ordered through the [place] and will be sent to his home. Pt has transportation home.
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
## 2023-06-29 NOTE — Transfer of Care (Signed)
Immediate Anesthesia Transfer of Care Note  Patient: Justin Nolan  Procedure(s) Performed: LEFT TOTAL KNEE REPLACEMENT (Left: Knee)  Patient Location: PACU  Anesthesia Type:Regional and Spinal  Level of Consciousness: awake, alert , and oriented  Airway & Oxygen Therapy: Patient Spontanous Breathing  Post-op Assessment: Report given to RN and Post -op Vital signs reviewed and stable  Post vital signs: Reviewed and stable  Last Vitals:  Vitals Value Taken Time  BP 112/81 06/29/23 1215  Temp    Pulse 72 06/29/23 1215  Resp 18 06/29/23 1215  SpO2 89 % 06/29/23 1215  Vitals shown include unfiled device data.  Last Pain:  Vitals:   06/29/23 0822  TempSrc:   PainSc: 8       Patients Stated Pain Goal: 3 (06/29/23 5284)  Complications: No notable events documented.

## 2023-06-29 NOTE — Anesthesia Postprocedure Evaluation (Signed)
Anesthesia Post Note  Patient: Justin Nolan  Procedure(s) Performed: LEFT TOTAL KNEE REPLACEMENT (Left: Knee)     Patient location during evaluation: PACU Anesthesia Type: Regional and Spinal Level of consciousness: oriented and awake and alert Pain management: pain level controlled Vital Signs Assessment: post-procedure vital signs reviewed and stable Respiratory status: spontaneous breathing, respiratory function stable and patient connected to nasal cannula oxygen Cardiovascular status: blood pressure returned to baseline and stable Postop Assessment: no headache, no backache, no apparent nausea or vomiting and spinal receding Anesthetic complications: no  No notable events documented.  Last Vitals:  Vitals:   06/29/23 1315 06/29/23 1418  BP: (!) 166/87 (!) 157/94  Pulse: 61 65  Resp: 17 18  Temp: 36.4 C 36.7 C  SpO2: 96% 100%    Last Pain:  Vitals:   06/29/23 1418  TempSrc: Oral  PainSc:                  Glennys Schorsch L Joanmarie Tsang

## 2023-06-29 NOTE — Evaluation (Signed)
Physical Therapy Evaluation Patient Details Name: Justin Nolan MRN: [id number] DOB: [birth year] Today's Date: 06/29/2023  History of Present Illness  62 y.o. male presents to [facility] hospital on 06/29/2023 for elective L TKA. PMH includes R TKA, GERD, HTN, alcohol abuse, chronic low back pain.  Clinical Impression  Pt presents to PT with deficits in functional mobility, strength, power, endurance, ROM, gait. Pt is mobilizing well, ambulating with support of RW for household distances and negotiating stairs with hand hold for stability. Pt is able to complete all mobility necessary to discharge home. Pt will benefit from receiving a RW and BSC prior to discharge. PT will follow up if the pt remains admitted.        If plan is discharge home, recommend the following: A little help with bathing/dressing/bathroom;Assistance with cooking/housework;Assist for transportation   Can travel by private vehicle        Equipment Recommendations Rolling walker (2 wheels);BSC/3in1  Recommendations for Other Services       Functional Status Assessment Patient has had a recent decline in their functional status and demonstrates the ability to make significant improvements in function in a reasonable and predictable amount of time.     Precautions / Restrictions Precautions Precautions: Fall;Knee Precaution Booklet Issued: Yes (comment) Restrictions Weight Bearing Restrictions Per Provider Order: Yes LLE Weight Bearing Per Provider Order: Weight bearing as tolerated      Mobility  Bed Mobility Overal bed mobility: Modified Independent                  Transfers Overall transfer level: Needs assistance Equipment used: Rolling walker (2 wheels) Transfers: Sit to/from Stand Sit to Stand: Supervision                Ambulation/Gait Ambulation/Gait assistance: Supervision Gait Distance (Feet): 150 Feet Assistive device: Rolling walker (2 wheels) Gait Pattern/deviations:  Step-through pattern Gait velocity: reduced Gait velocity interpretation: <1.8 ft/sec, indicate of risk for recurrent falls   General Gait Details: steady step-through gait  Stairs Stairs: Yes Stairs assistance: Contact guard assist Stair Management: Step to pattern, Forwards (hand hold) Number of Stairs: 3 General stair comments: PT providing hand hold as pt has no railings, significant other made aware and able to assist  Wheelchair Mobility     Tilt Bed    Modified Rankin (Stroke Patients Only)       Balance Overall balance assessment: Needs assistance Sitting-balance support: No upper extremity supported, Feet supported Sitting balance-Leahy Scale: Good     Standing balance support: Single extremity supported, Reliant on assistive device for balance Standing balance-Leahy Scale: Poor                               Pertinent Vitals/Pain Pain Assessment Pain Assessment: Faces Faces Pain Scale: Hurts little more Pain Location: L knee Pain Descriptors / Indicators: Grimacing Pain Intervention(s): Monitored during session    Home Living Family/patient expects to be discharged to:: Private residence Living Arrangements: Spouse/significant other Available Help at Discharge: Family;Available 24 hours/day Type of Home: Apartment Home Access: Stairs to enter Entrance Stairs-Rails: None Entrance Stairs-Number of Steps: 3   Home Layout: One level Home Equipment: None      Prior Function Prior Level of Function : Independent/Modified Independent;Driving             Mobility Comments: ambulatory without DME       Extremity/Trunk Assessment   Upper Extremity Assessment Upper Extremity Assessment:  Overall WFL for tasks assessed    Lower Extremity Assessment Lower Extremity Assessment: LLE deficits/detail LLE Deficits / Details: post-op strength and ROM deficits as expected POD 0    Cervical / Trunk Assessment Cervical / Trunk Assessment:  Normal  Communication   Communication Communication: No apparent difficulties Cueing Techniques: Verbal cues  Cognition Arousal: Alert Behavior During Therapy: WFL for tasks assessed/performed Overall Cognitive Status: Within Functional Limits for tasks assessed                                          General Comments General comments (skin integrity, edema, etc.): VSS on RA    Exercises Other Exercises Other Exercises: PT provides education on TKR exercise packet   Assessment/Plan    PT Assessment Patient needs continued PT services  PT Problem List Decreased strength;Decreased range of motion;Decreased activity tolerance;Decreased balance;Decreased mobility;Decreased knowledge of use of DME;Pain       PT Treatment Interventions Gait training;DME instruction;Stair training;Functional mobility training;Therapeutic activities;Therapeutic exercise;Balance training;Neuromuscular re-education;Patient/family education    PT Goals (Current goals can be found in the Care Plan section)  Acute Rehab PT Goals Patient Stated Goal: to go home PT Goal Formulation: With patient Time For Goal Achievement: 07/03/23 Potential to Achieve Goals: Good    Frequency Min 1X/week     Co-evaluation               AM-PAC PT "6 Clicks" Mobility  Outcome Measure Help needed turning from your back to your side while in a flat bed without using bedrails?: None Help needed moving from lying on your back to sitting on the side of a flat bed without using bedrails?: None Help needed moving to and from a bed to a chair (including a wheelchair)?: A Little Help needed standing up from a chair using your arms (e.g., wheelchair or bedside chair)?: A Little Help needed to walk in hospital room?: A Little Help needed climbing 3-5 steps with a railing? : A Little 6 Click Score: 20    End of Session Equipment Utilized During Treatment: Gait belt Activity Tolerance: Patient tolerated  treatment well Patient left: in bed;with call bell/phone within reach Nurse Communication: Mobility status PT Visit Diagnosis: Other abnormalities of gait and mobility (R26.89);Pain Pain - Right/Left: Left Pain - part of body: Knee    Time: 8657-8469 PT Time Calculation (min) (ACUTE ONLY): 15 min   Charges:   PT Evaluation $PT Eval Low Complexity: 1 Low   PT General Charges $$ ACUTE PT VISIT: 1 Visit         Arlyss Gandy, PT, DPT Acute Rehabilitation Office [phone]   Arlyss Gandy 06/29/2023, 4:28 PM

## 2023-06-30 ENCOUNTER — Encounter (HOSPITAL_COMMUNITY): Payer: Self-pay | Admitting: Orthopaedic Surgery

## 2023-06-30 NOTE — Discharge Summary (Signed)
Patient ID: Justin Nolan MRN: [id number] DOB/AGE: 19-Sep-1960 62 y.o.  Admit date: 06/29/2023 Discharge date: 06/29/23  Admission Diagnoses:  Primary osteoarthritis of left knee  Discharge Diagnoses:  Principal Problem:   Primary osteoarthritis of left knee Active Problems:   Status post total left knee replacement   Past Medical History:  Diagnosis Date   Anxiety    Arthritis    BPH (benign prostatic hyperplasia)    GERD (gastroesophageal reflux disease)    Hypertension    Major depressive disorder    Pneumonia 02/09/2012   PTSD (post-traumatic stress disorder)    Sleep apnea    does not wear cpap    Surgeries: Procedure(s): LEFT TOTAL KNEE REPLACEMENT on 06/29/2023   Consultants (if any):   Discharged Condition: Improved  Hospital Course: Justin Nolan is an 62 y.o. male who was admitted 06/29/2023 with a diagnosis of Primary osteoarthritis of left knee and went to the operating room on 06/29/2023 and underwent the above named procedures.    He was given perioperative antibiotics:  Anti-infectives (From admission, onward)    Start     Dose/Rate Route Frequency Ordered Stop   06/29/23 1245  ceFAZolin (ANCEF) IVPB 2g/100 mL premix  Status:  Discontinued        2 g 200 mL/hr over 30 Minutes Intravenous Every 6 hours 06/29/23 1240 06/29/23 2339   06/29/23 1100  vancomycin (VANCOCIN) powder  Status:  Discontinued          As needed 06/29/23 1100 06/29/23 1210   06/29/23 0800  ceFAZolin (ANCEF) IVPB 2g/100 mL premix        2 g 200 mL/hr over 30 Minutes Intravenous On call to O.R. 06/29/23 0746 06/29/23 1021     .  He was given sequential compression devices, early ambulation, and appropriate chemoprophylaxis for DVT prophylaxis.  He benefited maximally from the hospital stay and there were no complications.    Recent vital signs:  Vitals:   06/29/23 1418 06/29/23 1545  BP: (!) 157/94 (!) 157/97  Pulse: 65 75  Resp: 18 20  Temp: 98 F (36.7 C) 99 F  (37.2 C)  SpO2: 100% 98%    Recent laboratory studies:  Lab Results  Component Value Date   HGB 14.7 06/24/2023   HGB 13.7 10/02/2022   HGB 13.7 04/25/2019   Lab Results  Component Value Date   WBC 7.8 06/24/2023   PLT 198 06/24/2023   Lab Results  Component Value Date   INR 1.0 04/25/2019   Lab Results  Component Value Date   NA 139 06/24/2023   K 3.1 (L) 06/24/2023   CL 100 06/24/2023   CO2 29 06/24/2023   BUN 17 06/24/2023   CREATININE 1.26 (H) 06/24/2023   GLUCOSE 180 (H) 06/24/2023    Discharge Medications:   Allergies as of 06/29/2023   No Known Allergies      Medication List     STOP taking these medications    potassium chloride 10 MEQ tablet Commonly known as: KLOR-CON M       TAKE these medications    amLODipine 2.5 MG tablet Commonly known as: NORVASC Take 7.5 mg by mouth daily.   docusate sodium 100 MG capsule Commonly known as: Colace Take 1 capsule (100 mg total) by mouth daily as needed.   lisinopril 10 MG tablet Commonly known as: ZESTRIL Take 10 mg by mouth daily.   methocarbamol 750 MG tablet Commonly known as: Robaxin-750 Take 1 tablet (750  mg total) by mouth 2 (two) times daily as needed for muscle spasms.   ondansetron 4 MG tablet Commonly known as: Zofran Take 1 tablet (4 mg total) by mouth every 8 (eight) hours as needed for nausea or vomiting.   oxyCODONE-acetaminophen 5-325 MG tablet Commonly known as: Percocet Take 1-2 tablets by mouth every 8 (eight) hours as needed. To be taken after surgery   pantoprazole 40 MG tablet Commonly known as: PROTONIX TAKE ONE TABLET BY MOUTH ONCE DAILY   rivaroxaban 10 MG Tabs tablet Commonly known as: XARELTO Take 1 tablet (10 mg total) by mouth daily. To be taken after surgery to prevent blood clots   venlafaxine XR 150 MG 24 hr capsule Commonly known as: EFFEXOR-XR Take 150 mg by mouth daily as needed (mood swings).        Diagnostic Studies: DG Knee Left  Port Result Date: 06/29/2023 CLINICAL DATA:  Postop in PACU. EXAM: PORTABLE LEFT KNEE - 1-2 VIEW COMPARISON:  Preoperative exam FINDINGS: Left knee arthroplasty in expected alignment. No periprosthetic lucency or fracture. Recent postsurgical change includes air and edema in the soft tissues and joint space. IMPRESSION: Left knee arthroplasty without immediate postoperative complication. Electronically Signed   By: Narda Rutherford M.D.   On: 06/29/2023 15:41    Disposition: Discharge disposition: 01-Home or Self Care       Discharge Instructions     Call MD / Call 911   Complete by: As directed    If you experience chest pain or shortness of breath, CALL 911 and be transported to the hospital emergency room.  If you develope a fever above 101.5 F, pus (white drainage) or increased drainage or redness at the wound, or calf pain, call your surgeon's office.   Constipation Prevention   Complete by: As directed    Drink plenty of fluids.  Prune juice may be helpful.  You may use a stool softener, such as Colace (over the counter) 100 mg twice a day.  Use MiraLax (over the counter) for constipation as needed.   Driving restrictions   Complete by: As directed    No driving while taking narcotic pain meds.   Increase activity slowly as tolerated   Complete by: As directed    Post-operative opioid taper instructions:   Complete by: As directed    POST-OPERATIVE OPIOID TAPER INSTRUCTIONS: It is important to wean off of your opioid medication as soon as possible. If you do not need pain medication after your surgery it is ok to stop day one. Opioids include: Codeine, Hydrocodone(Norco, Vicodin), Oxycodone(Percocet, oxycontin) and hydromorphone amongst others.  Long term and even short term use of opiods can cause: Increased pain response Dependence Constipation Depression Respiratory depression And more.  Withdrawal symptoms can include Flu like symptoms Nausea, vomiting And  more Techniques to manage these symptoms Hydrate well Eat regular healthy meals Stay active Use relaxation techniques(deep breathing, meditating, yoga) Do Not substitute Alcohol to help with tapering If you have been on opioids for less than two weeks and do not have pain than it is ok to stop all together.  Plan to wean off of opioids This plan should start within one week post op of your joint replacement. Maintain the same interval or time between taking each dose and first decrease the dose.  Cut the total daily intake of opioids by one tablet each day Next start to increase the time between doses. The last dose that should be eliminated is the evening dose.  Follow-up Information     Cristie Hem, PA-C. Schedule an appointment as soon as possible for a visit in 2 week(s).   Specialty: Orthopedic Surgery Contact information: [address] [place] [address] [phone]         [facility] Follow up.   Why: [facility] will contact you for the first home visit. Contact information: [address] [place] [address] [phone]                  Signed: Glee Arvin 06/30/2023, 8:00 PM

## 2023-07-02 ENCOUNTER — Other Ambulatory Visit: Payer: Self-pay

## 2023-07-02 ENCOUNTER — Telehealth: Payer: Self-pay | Admitting: Orthopaedic Surgery

## 2023-07-02 ENCOUNTER — Telehealth: Payer: Self-pay

## 2023-07-02 MED ORDER — METHOCARBAMOL 750 MG PO TABS: 750.0000 mg | ORAL_TABLET | Freq: Two times a day (BID) | ORAL | 2 refills | Status: AC | PRN

## 2023-07-02 NOTE — Telephone Encounter (Signed)
We can resend the robaxin to the [facility].  Have him take 800 mg ibuprofen once a day until it improves.

## 2023-07-02 NOTE — Telephone Encounter (Signed)
Called and relayed information to patient.

## 2023-07-02 NOTE — Telephone Encounter (Signed)
Called and gave verbal to [place].

## 2023-07-02 NOTE — Telephone Encounter (Signed)
Tamela Oddi, PT with [facility] called stating that patient pain level is a 10/10 and that the Oxycodone is not helping, having a hard time sleeping, and its making the patient dizzy.  Patient was not able to get methocarbamol through [facility] pharmacy due to Rx being to expensive.  Would like Rx sent to the [facility] in [place].  CB# for Tamela Oddi is [phone].  Please advise.  Thank you.

## 2023-07-02 NOTE — Telephone Encounter (Signed)
Besty called from [facility] with verbal orders. 2 wk 2, 1 wk 1. CB#[phone]

## 2023-07-06 ENCOUNTER — Telehealth: Payer: Self-pay

## 2023-07-06 MED ORDER — RIVAROXABAN 10 MG PO TABS: 10.0000 mg | ORAL_TABLET | Freq: Every day | ORAL | 0 refills | Status: AC

## 2023-07-06 MED ORDER — OXYCODONE-ACETAMINOPHEN 5-325 MG PO TABS: 1.0000 | ORAL_TABLET | Freq: Two times a day (BID) | ORAL | 0 refills | Status: DC | PRN

## 2023-07-06 NOTE — Telephone Encounter (Signed)
Patient calling stating he needs pain medication He states the oxycodone he is taking 2 at a time and this is not working. Also, he needs this sent into his [place] pharmacy because it is free If Xarelto is what you want him on this too needs to be sent to the [place] His callback# is [phone]

## 2023-07-06 NOTE — Telephone Encounter (Signed)
I sent percocet and xarelto to the [place].  Has he not been taking any blood thinners since the surgery?

## 2023-07-06 NOTE — Addendum Note (Signed)
Addended by: Mayra Reel on: 07/06/2023 01:16 PM   Modules accepted: Orders

## 2023-07-08 NOTE — Telephone Encounter (Signed)
I've already sent the 5 mg percocets.  He can just double up if he needs to.

## 2023-07-09 NOTE — Telephone Encounter (Signed)
Called patient. He has not been to the [place] pharmacy to pick up Xarelto.

## 2023-07-13 ENCOUNTER — Telehealth: Payer: Self-pay | Admitting: Orthopaedic Surgery

## 2023-07-13 NOTE — Telephone Encounter (Signed)
Ok, thanks for the update.

## 2023-07-13 NOTE — Telephone Encounter (Signed)
Justin Nolan (PT) from [facility] called with an update pain level 8 out of 10 today. Pt has an appt tomorrow 12/31. Justin Nolan secure number is [phone].

## 2023-07-14 ENCOUNTER — Ambulatory Visit (INDEPENDENT_AMBULATORY_CARE_PROVIDER_SITE_OTHER): Payer: No Typology Code available for payment source | Admitting: Physician Assistant

## 2023-07-14 ENCOUNTER — Encounter: Payer: Self-pay | Admitting: Physician Assistant

## 2023-07-14 DIAGNOSIS — Z96652 Presence of left artificial knee joint: Secondary | ICD-10-CM

## 2023-07-14 MED ORDER — OXYCODONE-ACETAMINOPHEN 5-325 MG PO TABS: 1.0000 | ORAL_TABLET | Freq: Two times a day (BID) | ORAL | 0 refills | Status: AC | PRN

## 2023-07-14 NOTE — Addendum Note (Signed)
 Addended by: Wendi Maya on: 07/14/2023 03:18 PM   Modules accepted: Orders

## 2023-07-14 NOTE — Progress Notes (Signed)
 Post-Op Visit Note   Patient: Justin Nolan           Date of Birth: September 25, 1960           MRN: [id number] Visit Date: 07/14/2023 PCP: [facility]   Assessment & Plan:  Chief Complaint:  Chief Complaint  Patient presents with   Left Knee - Follow-up    Left total knee arthroplasty 06/29/2023   Visit Diagnoses:  1. Status post total left knee replacement     Plan: Patient is a 62 year old gentleman who comes in today 2 weeks status post left total knee replacement 06/29/2023.  He has been in pain since surgery.  He was prescribed Percocet 11/14/2023 1-2 every 6-8 hours in addition to Robaxin  for pain.  He states that this has not helped at all as he was taking oxycodone  prior to surgery.  According to the database he was prescribed 60 oxycodone  monthly from May through September of this past year.  He also tells me that he just picked up his Xarelto  yesterday.  He was prescribed this to the [facility] pharmacy on 06/24/2023 but due to cost he did not pick it up.  Our office was notified on 07/06/2023 of this and medication list sent to the [facility].  He has been getting home health physical therapy and is ambulating with a walker.  Examination of the left knee reveals a fully healed surgical scar without complication.  Calf is soft and nontender.  He is neurovascularly intact distally.  Today, sutures were removed and Steri-Strips applied.  We have sent in a referral for PT through the [facility].  I have refilled his oxycodone .  We have discussed referral to pain management for which she would like.  He will continue taking the Xarelto  for DVT prophylaxis.  Follow-up with Dr. Jerri in 4 weeks for repeat evaluation and 2 view x-rays of the left knee.  Call with concerns or questions.  Follow-Up Instructions: Return in about 4 weeks (around 08/11/2023) for f/u with Dr. Jerri .   Orders:  No orders of the defined types were placed in this encounter.  No orders of the defined types were placed in this  encounter.   Imaging: No new imaging  PMFS History: Patient Active Problem List   Diagnosis Date Noted   Primary osteoarthritis of left knee 06/29/2023   Status post total left knee replacement 06/29/2023   Chronic low back pain 02/11/2018   Alcohol abuse 02/16/2012   HTN (hypertension) 02/09/2012   GERD (gastroesophageal reflux disease) 02/09/2012   Tobacco use 02/09/2012   Indirect hyperbilirubinemia 02/09/2012   Past Medical History:  Diagnosis Date   Anxiety    Arthritis    BPH (benign prostatic hyperplasia)    GERD (gastroesophageal reflux disease)    Hypertension    Major depressive disorder    Pneumonia 02/09/2012   PTSD (post-traumatic stress disorder)    Sleep apnea    does not wear cpap    Family History  Problem Relation Age of Onset   Diabetes Maternal Aunt    Diabetes Paternal Aunt     Past Surgical History:  Procedure Laterality Date   KNEE SURGERY     ROTATOR CUFF REPAIR Left 2020   TOTAL KNEE ARTHROPLASTY Right 05/07/2017   Procedure: RIGHT TOTAL KNEE ARTHROPLASTY;  Surgeon: Jerri Kay CHRISTELLA, MD;  Location: [facility] OR;  Service: Orthopedics;  Laterality: Right;   TOTAL KNEE ARTHROPLASTY Left 06/29/2023   Procedure: LEFT TOTAL KNEE REPLACEMENT;  Surgeon:  Jerri Kay HERO, MD;  Location: [facility] OR;  Service: Orthopedics;  Laterality: Left;   Social History   Occupational History   Not on file  Tobacco Use   Smoking status: Every Day    Current packs/day: 0.25    Average packs/day: 0.3 packs/day for 25.0 years (6.3 ttl pk-yrs)    Types: Cigarettes   Smokeless tobacco: Never   Tobacco comments:    Taking lozenges and wearing nicotine  patches  Vaping Use   Vaping status: Never Used  Substance and Sexual Activity   Alcohol use: Yes    Alcohol/week: 3.0 standard drinks of alcohol    Types: 3 Shots of liquor per week    Comment: drink occasional   Drug use: No   Sexual activity: Yes

## 2023-07-22 ENCOUNTER — Encounter: Payer: Self-pay | Admitting: Physical Therapy

## 2023-07-22 ENCOUNTER — Ambulatory Visit (INDEPENDENT_AMBULATORY_CARE_PROVIDER_SITE_OTHER): Payer: No Typology Code available for payment source | Admitting: Physical Therapy

## 2023-07-22 DIAGNOSIS — M6281 Muscle weakness (generalized): Secondary | ICD-10-CM

## 2023-07-22 DIAGNOSIS — M25562 Pain in left knee: Secondary | ICD-10-CM | POA: Diagnosis not present

## 2023-07-22 DIAGNOSIS — M25662 Stiffness of left knee, not elsewhere classified: Secondary | ICD-10-CM | POA: Diagnosis not present

## 2023-07-22 DIAGNOSIS — R2689 Other abnormalities of gait and mobility: Secondary | ICD-10-CM | POA: Diagnosis not present

## 2023-07-22 DIAGNOSIS — R600 Localized edema: Secondary | ICD-10-CM

## 2023-07-22 NOTE — Therapy (Addendum)
 OUTPATIENT PHYSICAL THERAPY LOWER EXTREMITY EVALUATION   Patient Name: Justin Nolan MRN: [id number] DOB:1961/03/18, 63 y.o., male Today's Date: 07/22/2023  END OF SESSION:  PT End of Session - 07/22/23 0937     Visit Number 1    Number of Visits 20    Date for PT Re-Evaluation 10/02/23    Authorization Type [facility]    Authorization Time Period 15 visits    Authorization - Visit Number 1    Authorization - Number of Visits 15    Progress Note Due on Visit 10    PT Start Time 0846    PT Stop Time 0950    PT Time Calculation (min) 64 min    Activity Tolerance Patient tolerated treatment well;Patient limited by pain    Behavior During Therapy [facility] for tasks assessed/performed             Past Medical History:  Diagnosis Date   Anxiety    Arthritis    BPH (benign prostatic hyperplasia)    GERD (gastroesophageal reflux disease)    Hypertension    Major depressive disorder    Pneumonia 02/09/2012   PTSD (post-traumatic stress disorder)    Sleep apnea    does not wear cpap   Past Surgical History:  Procedure Laterality Date   KNEE SURGERY     ROTATOR CUFF REPAIR Left 2020   TOTAL KNEE ARTHROPLASTY Right 05/07/2017   Procedure: RIGHT TOTAL KNEE ARTHROPLASTY;  Surgeon: Jerri Kay CHRISTELLA, MD;  Location: [facility] OR;  Service: Orthopedics;  Laterality: Right;   TOTAL KNEE ARTHROPLASTY Left 06/29/2023   Procedure: LEFT TOTAL KNEE REPLACEMENT;  Surgeon: Jerri Kay CHRISTELLA, MD;  Location: [facility] OR;  Service: Orthopedics;  Laterality: Left;   Patient Active Problem List   Diagnosis Date Noted   Primary osteoarthritis of left knee 06/29/2023   Status post total left knee replacement 06/29/2023   Chronic low back pain 02/11/2018   Alcohol abuse 02/16/2012   HTN (hypertension) 02/09/2012   GERD (gastroesophageal reflux disease) 02/09/2012   Tobacco use 02/09/2012   Indirect hyperbilirubinemia 02/09/2012    PCP: [facility]  REFERRING PROVIDER: Jule Ronal LITTIE DEVONNA  REFERRING  DIAG: [phone] (ICD-10-CM) - Status post total left knee replacement  THERAPY DIAG:  Acute pain of left knee  Stiffness of left knee, not elsewhere classified  Muscle weakness (generalized)  Other abnormalities of gait and mobility  Localized edema  Rationale for Evaluation and Treatment: Rehabilitation  ONSET DATE: 06/29/23 Left TKA  SUBJECTIVE:   SUBJECTIVE STATEMENT: Patient underwent a L TKA  due to OA on 06/29/23. Patient on percocet in addition to robaxin  for pain but has been in constant pain since with no relief. Has been taking oxycodone  for pain. Has been getting HHPT up until last week and is ambulating with RW. Surgical scar healed without complication, sutures have been removed. Using CPM machine up to 95 and uses it daily total of 1hr. Has not been sleeping well pain meds only make him drowsy. Sleeps on back with knee elevated. Reports significant knee swelling which prevents him from going about daily activities and exercising.   Current smoker- cigarettes 0.25 packs/day. Taking lozenges and wearing nicotine  patches Alcohol use- 3 shots of liquor per week  PERTINENT HISTORY: Left TKA (06/29/23), Shoulder arthroscopy w/ RTC repair (03/16/20), Right TKA, L knee OA, Anxiety, arthritis, GERD, HTN, PTSD, Major depressive disorder  PAIN:  NPRS scale: 7-8/10, has not been lower than 7 in the last week, at worst  9/10 Pain location: medial side of L knee, no calf pain, mild inguinal  Pain description: constant dull pain, sharp, muscle spasms Aggravating factors: use of knee, lack of meds, initial standing /walking Relieving factors: ice and rest  PRECAUTIONS: Fall  WEIGHT BEARING RESTRICTIONS:  WBAT  FALLS:  Has patient fallen in last 6 months? No  LIVING ENVIRONMENT: Lives with: lives alone Lives in: House/apartment Stairs: Yes: External: 1 steps; none, back steps: 3 steps without rail Has following equipment at home: Single point cane and Walker - 2  wheeled  OCCUPATION: retired, on disability  LEISURE ACTIVITIES: staying healthy   PLOF: Independent  PATIENT GOALS:  To be pain free and be more independent   Next MD visit: around 08/11/23 Dr. Jerri  OBJECTIVE:  DIAGNOSTIC FINDINGS: 06/29/23 XRAY Left knee arthroplasty in expected alignment. No periprosthetic lucency or fracture. Recent postsurgical change includes air and edema in the soft tissues and joint space.  PATIENT SURVEYS:  FOTO intake:  31%  predicted:  47%  COGNITION: Overall cognitive status: WFL    SENSATION: WFL  EDEMA:  LLE: above knee 43.5 cm,  around knee 43.25 cm, below knee  39.5 cm gastroc apex 36.5 cm RLE: above knee 40 cm,  around knee 35.5 cm, below knee  33 cm gastroc apex 34 cm  POSTURE:  Standing: flexed trunk with slightly rounded shoulders, weight shift to right Seated: rounded shoulders, keeps L knee outstretched due to pain with knee flexion requiring 90* or greater  PALPATION: Medial L knee tenderness, no quad tenderness Denies lateral knee pain or calf pain Knee swelling with calor and rubor of incision site Verbalizes difference in sensation of lateral area of knee   LOWER EXTREMITY ROM:   ROM P: passive  A:active Right eval Left eval  Hip flexion    Hip extension    Hip abduction    Hip adduction    Hip internal rotation    Hip external rotation    Knee flexion  Seated A: 89* P: 94* Painful end feel Supine A:  91* Pt declined passive  Knee extension  Standing A: -16* P: -5* painful  end feel  Seated LAQ -38* Supine  Quad set -10* P: -4*  Ankle dorsiflexion    Ankle plantarflexion    Ankle inversion    Ankle eversion     (Blank rows = not tested)  LOWER EXTREMITY MMT:  MMT Right eval Left eval  Hip flexion    Hip extension    Hip abduction    Hip adduction    Hip internal rotation    Hip external rotation    Knee flexion  3-/5  Knee extension  3-/5  Ankle dorsiflexion    Ankle plantarflexion     Ankle inversion    Ankle eversion     (Blank rows = not tested)  FUNCTIONAL TESTS:  18 inch chair transfer: use of BUE on armrest and use of walker; keeps L leg in front to prevent increased knee flexion when standing and sitting  GAIT: Distance walked: 34' x2  Assistive device utilized: [employer] - 2 wheeled Level of assistance: supervision / needs cues for deviations Comments: antalgic with decreased single leg stance time, bilateral internal rotation (toe-in) gait with L>R,  L knee flexion in stance with minimal to no increase for swing, appropriate initial contact with heel strike bilaterally, decreased step length,    TODAY'S TREATMENT  DATE: 07/22/23 Vaso at dual hose for ankle and knee with elevation 34* medium compression HEP exercises with demo, tc, and vc provided for proper form and to reduce instances of pain. Patient verbalized and demonstrated understanding of HEP and its use.  PT educated on elevation >/=2x/day for >/=15 min with ankle muscle activity.   Therex:    HEP instruction/performance c cues for techniques, handout provided.  Trial set performed of each for comprehension and symptom assessment.  See below for exercise list  PATIENT EDUCATION:  Education details: HEP, POC, elevation Person educated: Patient Education method: Explanation, Demonstration, Verbal cues, and Handouts Education comprehension: verbalized understanding, returned demonstration, and verbal cues required  HOME EXERCISE PROGRAM: Medbridge Code: [id number]  Exercises - Ankle Alphabet in Elevation  - 2-4 x daily - 7 x weekly - 1 sets - 1 reps - Quad Setting and Stretching  - 2-4 x daily - 7 x weekly - 5-10 sets - 10 reps - prop 5-10 minutes & quad set5 seconds hold - Supine Heel Slide with Strap  - 2-3 x daily - 7 x weekly - 2-3 sets - 10 reps - 5 seconds hold - Supine Straight Leg Raises  - 2-3 x daily - 7 x weekly -  2-3 sets - 10 reps - 5 seconds hold - Seated Knee Flexion Extension AROM   - 2-4 x daily - 7 x weekly - 2-3 sets - 10 reps - 5 seconds hold - Seated straight leg lifts  - 2-3 x daily - 7 x weekly - 2-3 sets - 10 reps - 5 seconds hold - Seated Hamstring Stretch with Strap  - 2-4 x daily - 7 x weekly - 1 sets - 3 reps - 30 seconds hold - Standing Quad Set  - 2-3 x daily - 7 x weekly - 2 sets - 10 reps - 5 seconds hold  ASSESSMENT: CLINICAL IMPRESSION: Patient is a 63 y.o. who comes to clinic s/p L TKA complaints of pain with mobility, strength and movement coordination deficits that impair their ability to perform usual daily and recreational functional activities without increase difficulty/symptoms at this time.  Patient to benefit from skilled PT services to address impairments and limitations to improve to previous level of function without restriction secondary to condition.   OBJECTIVE IMPAIRMENTS: Abnormal gait, decreased activity tolerance, decreased balance, decreased endurance, decreased knowledge of condition, decreased knowledge of use of DME, decreased mobility, difficulty walking, decreased ROM, decreased strength, increased edema, impaired sensation, postural dysfunction, and pain.   ACTIVITY LIMITATIONS: carrying, lifting, bending, sitting, standing, squatting, sleeping, stairs, transfers, and locomotion level  PARTICIPATION LIMITATIONS: meal prep, cleaning, laundry, and community activity  PERSONAL FACTORS: Time since onset of injury/illness/exacerbation, 3+ comorbidities: see PMH, and Decreased pain medication use with increased pain and interference of sleep  are also affecting patient's functional outcome.   REHAB POTENTIAL: Excellent  CLINICAL DECISION MAKING: Stable/uncomplicated  EVALUATION COMPLEXITY: Low   GOALS: Goals reviewed with patient? Yes  SHORT TERM GOALS: (target date for Short term goals 08/19/23)   1.  Patient will demonstrate independent use of home  exercise program to maintain progress from in clinic treatments. Baseline: See objective data Goal status: Initial  2. PROM left knee -5* ext to 105* flex to facilitate proper gait mechanics  Baseline: See objective data Goal status: Initial  3. Interim FOTO 37% Baseline: See objective data Goal status: Initial  LONG TERM GOALS: (target dates for all long term goals  10/02/23 )  1. Patient will demonstrate/report pain at worst less than or equal to 2/10 to facilitate minimal limitation in daily activity secondary to pain symptoms. Baseline: See objective data Goal status: Initial   2. Patient will demonstrate independent use of home exercise program to facilitate ability to maintain/progress functional gains from skilled physical therapy services. Baseline: See objective data Goal status: Initial   3. Patient will demonstrate FOTO outcome > or = 47 % to indicate reduced disability due to condition. Baseline: See objective data Goal status: Initial   4.  Patient will demonstrate left knee LE MMT 5/5 throughout to faciltiate usual transfers, stairs, squatting at [facility] for daily life.  Baseline: See objective data Goal status: Initial   5.  Patient will demonstrate left knee AROM 0* to 110* for higher functional demands for daily activities Baseline: See objective data Goal status: Initial   6.  Patient will ambulate >593ft and negotiate ramps, curbs & stairs single rail independently for safety with community ambulation needs Baseline: See objective data Goal status: Initial   PLAN:  PT FREQUENCY:  2x/week  PT DURATION: 10 weeks  PLANNED INTERVENTIONS: 97164- PT Re-evaluation, 97110-Therapeutic exercises, 97530- Therapeutic activity, 97112- Neuromuscular re-education, 97535- Self Care, [id number]- Manual therapy, [id number]- Gait training, [phone]- Electrical stimulation (manual), 97016- Vasopneumatic device, Patient/Family education, Balance training, Stair training, Taping, Joint  mobilization, Scar mobilization, DME instructions, and Cryotherapy  PLAN FOR NEXT SESSION: Review & Update HEP. Manual therapy & therapeutic exercise for knee flexion and extension. Addition of longer duration knee flexion holds and stretching to quads to prevent contractures. Scar mobilization to prevent adhesions. Continue calf stretching for prevention of increased stiffness. Increased ambulation and gait training for decreased DVT risk and improved overall function of L knee.   Adelene Polivka, Catheryn Slifer, Student-PT 07/22/2023, 1:58 PM  During this evaluation / treatment session, the Physical Therapist/Physical Therapist Assistant was present and participating in the session. This entire session was performed under direct supervision and direction of a licensed therapist/therapist assistant . I have personally read, edited and approve of the note as written.   Grayce Spatz, PT, DPT 07/22/2023, 2:39 PM

## 2023-07-27 ENCOUNTER — Encounter: Payer: Self-pay | Admitting: Physical Therapy

## 2023-07-27 ENCOUNTER — Ambulatory Visit (INDEPENDENT_AMBULATORY_CARE_PROVIDER_SITE_OTHER): Payer: No Typology Code available for payment source | Admitting: Physical Therapy

## 2023-07-27 DIAGNOSIS — M25662 Stiffness of left knee, not elsewhere classified: Secondary | ICD-10-CM

## 2023-07-27 DIAGNOSIS — R600 Localized edema: Secondary | ICD-10-CM | POA: Diagnosis not present

## 2023-07-27 DIAGNOSIS — M25562 Pain in left knee: Secondary | ICD-10-CM

## 2023-07-27 DIAGNOSIS — R2689 Other abnormalities of gait and mobility: Secondary | ICD-10-CM

## 2023-07-27 DIAGNOSIS — M6281 Muscle weakness (generalized): Secondary | ICD-10-CM | POA: Diagnosis not present

## 2023-07-27 NOTE — Therapy (Addendum)
 OUTPATIENT PHYSICAL THERAPY LOWER EXTREMITY EVALUATION   Patient Name: Justin Nolan MRN: [id number] DOB:1961-01-23, 63 y.o., male Today's Date: 07/27/2023  END OF SESSION:  PT End of Session - 07/27/23 0855     Visit Number 2    Number of Visits 20    Date for PT Re-Evaluation 10/02/23    Authorization Type [facility]    Authorization Time Period 15 visits    Authorization - Visit Number 2    Authorization - Number of Visits 15    Progress Note Due on Visit 10    PT Start Time 0853    PT Stop Time 0942    PT Time Calculation (min) 49 min    Activity Tolerance Patient tolerated treatment well;Patient limited by pain    Behavior During Therapy [facility] for tasks assessed/performed              Past Medical History:  Diagnosis Date   Anxiety    Arthritis    BPH (benign prostatic hyperplasia)    GERD (gastroesophageal reflux disease)    Hypertension    Major depressive disorder    Pneumonia 02/09/2012   PTSD (post-traumatic stress disorder)    Sleep apnea    does not wear cpap   Past Surgical History:  Procedure Laterality Date   KNEE SURGERY     ROTATOR CUFF REPAIR Left 2020   TOTAL KNEE ARTHROPLASTY Right 05/07/2017   Procedure: RIGHT TOTAL KNEE ARTHROPLASTY;  Surgeon: Jerri Kay CHRISTELLA, MD;  Location: [facility] OR;  Service: Orthopedics;  Laterality: Right;   TOTAL KNEE ARTHROPLASTY Left 06/29/2023   Procedure: LEFT TOTAL KNEE REPLACEMENT;  Surgeon: Jerri Kay CHRISTELLA, MD;  Location: [facility] OR;  Service: Orthopedics;  Laterality: Left;   Patient Active Problem List   Diagnosis Date Noted   Primary osteoarthritis of left knee 06/29/2023   Status post total left knee replacement 06/29/2023   Chronic low back pain 02/11/2018   Alcohol abuse 02/16/2012   HTN (hypertension) 02/09/2012   GERD (gastroesophageal reflux disease) 02/09/2012   Tobacco use 02/09/2012   Indirect hyperbilirubinemia 02/09/2012    PCP: [facility]  REFERRING PROVIDER: Jule Ronal LITTIE DEVONNA  REFERRING  DIAG: [phone] (ICD-10-CM) - Status post total left knee replacement  THERAPY DIAG:  Acute pain of left knee  Stiffness of left knee, not elsewhere classified  Muscle weakness (generalized)  Localized edema  Other abnormalities of gait and mobility  Rationale for Evaluation and Treatment: Rehabilitation  ONSET DATE: 06/29/23 Left TKA  SUBJECTIVE:   SUBJECTIVE STATEMENT: Arrives to session with cane and antalgic gait pattern due to stiffness and slight pain.Patient endorses consistent pain but less swelling compared to eval. Has been taking goody powder for pain and using ice. States that he has knee pain medially and icing does seem to help relieve the pain. Verbalizes HEP has been going well and he has seen improvements in knee flexion.   PERTINENT HISTORY: Left TKA (06/29/23), Shoulder arthroscopy w/ RTC repair (03/16/20), Right TKA, L knee OA, Anxiety, arthritis, GERD, HTN, PTSD, Major depressive disorder  PAIN:  NPRS scale: 6-7/10, has not been lower than 5 in the last week, at worst 8-9/10 when doing exercises since he has been stiff Pain location: medial side of L knee, no calf pain, mild inguinal  Pain description: constant dull pain, sharp, muscle spasms Aggravating factors: use of knee, lack of meds, initial standing /walking Relieving factors: ice and rest  PRECAUTIONS: Fall  WEIGHT BEARING RESTRICTIONS:  WBAT  FALLS:  Has patient fallen in last 6 months? No  LIVING ENVIRONMENT: Lives with: lives alone Lives in: House/apartment Stairs: Yes: External: 1 steps; none, back steps: 3 steps without rail Has following equipment at home: Single point cane and Walker - 2 wheeled  OCCUPATION: retired, on disability  LEISURE ACTIVITIES: staying healthy   PLOF: Independent  PATIENT GOALS:  To be pain free and be more independent   Next MD visit: around 08/11/23 Dr. Jerri  OBJECTIVE:  DIAGNOSTIC FINDINGS: 06/29/23 XRAY Left knee arthroplasty in expected alignment. No  periprosthetic lucency or fracture. Recent postsurgical change includes air and edema in the soft tissues and joint space.  PATIENT SURVEYS:  FOTO intake:  31%  predicted:  47%  COGNITION: Overall cognitive status: WFL    SENSATION: WFL  EDEMA:  LLE: above knee 43.5 cm,  around knee 43.25 cm, below knee  39.5 cm gastroc apex 36.5 cm RLE: above knee 40 cm,  around knee 35.5 cm, below knee  33 cm gastroc apex 34 cm  POSTURE:  Standing: flexed trunk with slightly rounded shoulders, weight shift to right Seated: rounded shoulders, keeps L knee outstretched due to pain with knee flexion requiring 90* or greater  PALPATION: Medial L knee tenderness, no quad tenderness Denies lateral knee pain or calf pain Knee swelling with calor and rubor of incision site Verbalizes difference in sensation of lateral area of knee   LOWER EXTREMITY ROM:   ROM P: passive  A:active Right eval Left eval  Hip flexion    Hip extension    Hip abduction    Hip adduction    Hip internal rotation    Hip external rotation    Knee flexion  Seated A: 89* P: 94* Painful end feel Supine A:  91* Pt declined passive  Knee extension  Standing A: -16* P: -5* painful  end feel  Seated LAQ -38* Supine  Quad set -10* P: -4*  Ankle dorsiflexion    Ankle plantarflexion    Ankle inversion    Ankle eversion     (Blank rows = not tested)  LOWER EXTREMITY MMT:  MMT Right eval Left eval  Hip flexion    Hip extension    Hip abduction    Hip adduction    Hip internal rotation    Hip external rotation    Knee flexion  3-/5  Knee extension  3-/5  Ankle dorsiflexion    Ankle plantarflexion    Ankle inversion    Ankle eversion     (Blank rows = not tested)  FUNCTIONAL TESTS:  18 inch chair transfer: use of BUE on armrest and use of walker; keeps L leg in front to prevent increased knee flexion when standing and sitting  GAIT: Distance walked: 47' x2  Assistive device utilized: [employer] - 2  wheeled Level of assistance: supervision / needs cues for deviations Comments: antalgic with decreased single leg stance time, bilateral internal rotation (toe-in) gait with L>R,  L knee flexion in stance with minimal to no increase for swing, appropriate initial contact with heel strike bilaterally, decreased step length,    TODAY'S TREATMENT  DATE: 07/27/23 Therex: Nu step for increased ROM and endurance seat 13 level 1 Seated knee extension and flexion with 5 hold and use of non-surgical leg to assist past patients L AROM   Manual: Seated knee flexion with hold at patient end range Knee P/A grade II-III mobs to tolerance for facilitation of extension  Self-Care: PT instructed pt with verbal & demo cues on ice massage and scar mobilizations.  Pt was able to verbalize and return demo understanding.   Vaso at knee with elevation 34* medium compression   TREATMENT                                                                          DATE: 07/22/23 Vaso at dual hose for ankle and knee with elevation 34* medium compression HEP exercises with demo, tc, and vc provided for proper form and to reduce instances of pain. Patient verbalized and demonstrated understanding of HEP and its use.  PT educated on elevation >/=2x/day for >/=15 min with ankle muscle activity.   Therex:    HEP instruction/performance c cues for techniques, handout provided.  Trial set performed of each for comprehension and symptom assessment.  See below for exercise list  PATIENT EDUCATION:  Education details: HEP, POC, elevation Person educated: Patient Education method: Explanation, Demonstration, Verbal cues, and Handouts Education comprehension: verbalized understanding, returned demonstration, and verbal cues required  HOME EXERCISE PROGRAM: Medbridge Code: [id number]  Exercises - Ankle Alphabet in Elevation  - 2-4 x daily  - 7 x weekly - 1 sets - 1 reps - Quad Setting and Stretching  - 2-4 x daily - 7 x weekly - 5-10 sets - 10 reps - prop 5-10 minutes & quad set5 seconds hold - Supine Heel Slide with Strap  - 2-3 x daily - 7 x weekly - 2-3 sets - 10 reps - 5 seconds hold - Supine Straight Leg Raises  - 2-3 x daily - 7 x weekly - 2-3 sets - 10 reps - 5 seconds hold - Seated Knee Flexion Extension AROM   - 2-4 x daily - 7 x weekly - 2-3 sets - 10 reps - 5 seconds hold - Seated straight leg lifts  - 2-3 x daily - 7 x weekly - 2-3 sets - 10 reps - 5 seconds hold - Seated Hamstring Stretch with Strap  - 2-4 x daily - 7 x weekly - 1 sets - 3 reps - 30 seconds hold - Standing Quad Set  - 2-3 x daily - 7 x weekly - 2 sets - 10 reps - 5 seconds hold -Ice massage  -Scar mobilization   ASSESSMENT: CLINICAL IMPRESSION: Patient continues to progress knee flexion and overall movement with knee motions. Tolerated P-A and A-P graded mobs for facilitation of extension and flexion as well as therapist overpressure in extension with towel roll. Patient continues to be motivated and compliant with exercises and HEP. Patient still demonstrates antalgic gait from deficits in quad control and stiffness of knee preventing full extension and flexion. Patient will benefit from continued skilled physical therapy to address deficits.   OBJECTIVE IMPAIRMENTS: Abnormal gait, decreased activity tolerance, decreased balance, decreased endurance, decreased knowledge of condition, decreased knowledge of use of  DME, decreased mobility, difficulty walking, decreased ROM, decreased strength, increased edema, impaired sensation, postural dysfunction, and pain.   ACTIVITY LIMITATIONS: carrying, lifting, bending, sitting, standing, squatting, sleeping, stairs, transfers, and locomotion level  PARTICIPATION LIMITATIONS: meal prep, cleaning, laundry, and community activity  PERSONAL FACTORS: Time since onset of injury/illness/exacerbation, 3+  comorbidities: see PMH, and Decreased pain medication use with increased pain and interference of sleep  are also affecting patient's functional outcome.   REHAB POTENTIAL: Excellent  CLINICAL DECISION MAKING: Stable/uncomplicated  EVALUATION COMPLEXITY: Low   GOALS: Goals reviewed with patient? Yes  SHORT TERM GOALS: (target date for Short term goals 08/19/23)   1.  Patient will demonstrate independent use of home exercise program to maintain progress from in clinic treatments. Baseline: See objective data Goal status: On going  2. PROM left knee -5* ext to 105* flex to facilitate proper gait mechanics  Baseline: See objective data Goal status: On going  3. Interim FOTO 37% Baseline: See objective data Goal status: Initial  LONG TERM GOALS: (target dates for all long term goals  10/02/23 )   1. Patient will demonstrate/report pain at worst less than or equal to 2/10 to facilitate minimal limitation in daily activity secondary to pain symptoms. Baseline: See objective data Goal status: Initial   2. Patient will demonstrate independent use of home exercise program to facilitate ability to maintain/progress functional gains from skilled physical therapy services. Baseline: See objective data Goal status: Initial   3. Patient will demonstrate FOTO outcome > or = 47 % to indicate reduced disability due to condition. Baseline: See objective data Goal status: Initial   4.  Patient will demonstrate left knee LE MMT 5/5 throughout to faciltiate usual transfers, stairs, squatting at [facility] for daily life.  Baseline: See objective data Goal status: Initial   5.  Patient will demonstrate left knee AROM 0* to 110* for higher functional demands for daily activities Baseline: See objective data Goal status: Initial   6.  Patient will ambulate >548ft and negotiate ramps, curbs & stairs single rail independently for safety with community ambulation needs Baseline: See objective data Goal  status: Initial   PLAN:  PT FREQUENCY:  2x/week  PT DURATION: 10 weeks  PLANNED INTERVENTIONS: 97164- PT Re-evaluation, 97110-Therapeutic exercises, 97530- Therapeutic activity, 97112- Neuromuscular re-education, 97535- Self Care, [id number]- Manual therapy, [id number]- Gait training, [phone]- Electrical stimulation (manual), 97016- Vasopneumatic device, Patient/Family education, Balance training, Stair training, Taping, Joint mobilization, Scar mobilization, DME instructions, and Cryotherapy  PLAN FOR NEXT SESSION: Progress to scifit bike for increased ROM.  Include Gastroc stretch, lunges on elevated surface for knee flexion, marching for balance and active flexion, TKE for increased activation of quads for inproved gait, step ups, and/or mini squats.  Continue with manual therapy & therapeutic exercise for knee flexion and extension. Continue calf stretching for prevention of increased stiffness. Increased ambulation and gait training for decreased DVT risk and improved overall function of L knee.   Carmin Alvidrez, Vansh Reckart, Student-PT 07/27/2023, 9:59 AM   During this evaluation / treatment session, the Physical Therapist/Physical Therapist Assistant was present and participating in the session. This entire session was performed under direct supervision and direction of a licensed therapist/therapist assistant . I have personally read, edited and approve of the note as written.    Grayce Spatz, PT, DPT 07/27/2023, 10:21 AM

## 2023-07-29 ENCOUNTER — Encounter: Payer: Self-pay | Admitting: Physical Therapy

## 2023-07-29 ENCOUNTER — Ambulatory Visit (INDEPENDENT_AMBULATORY_CARE_PROVIDER_SITE_OTHER): Payer: No Typology Code available for payment source | Admitting: Physical Therapy

## 2023-07-29 DIAGNOSIS — R600 Localized edema: Secondary | ICD-10-CM | POA: Diagnosis not present

## 2023-07-29 DIAGNOSIS — M25562 Pain in left knee: Secondary | ICD-10-CM | POA: Diagnosis not present

## 2023-07-29 DIAGNOSIS — M25662 Stiffness of left knee, not elsewhere classified: Secondary | ICD-10-CM

## 2023-07-29 DIAGNOSIS — R2689 Other abnormalities of gait and mobility: Secondary | ICD-10-CM

## 2023-07-29 DIAGNOSIS — M6281 Muscle weakness (generalized): Secondary | ICD-10-CM | POA: Diagnosis not present

## 2023-07-29 NOTE — Therapy (Addendum)
 OUTPATIENT PHYSICAL THERAPY TREATMENT  Patient Name: Justin Nolan MRN: [id number] DOB:April 13, 1961, 63 y.o., male Today's Date: 07/29/2023  END OF SESSION:  PT End of Session - 07/29/23 0833     Visit Number 3    Number of Visits 20    Date for PT Re-Evaluation 10/02/23    Authorization Type [facility]    Authorization Time Period 15 visits    Authorization - Visit Number 3    Authorization - Number of Visits 15    Progress Note Due on Visit 10    PT Start Time 0830    PT Stop Time 0920    PT Time Calculation (min) 50 min    Activity Tolerance Patient tolerated treatment well;Patient limited by pain    Behavior During Therapy [facility] for tasks assessed/performed              Past Medical History:  Diagnosis Date   Anxiety    Arthritis    BPH (benign prostatic hyperplasia)    GERD (gastroesophageal reflux disease)    Hypertension    Major depressive disorder    Pneumonia 02/09/2012   PTSD (post-traumatic stress disorder)    Sleep apnea    does not wear cpap   Past Surgical History:  Procedure Laterality Date   KNEE SURGERY     ROTATOR CUFF REPAIR Left 2020   TOTAL KNEE ARTHROPLASTY Right 05/07/2017   Procedure: RIGHT TOTAL KNEE ARTHROPLASTY;  Surgeon: Wes Hamman, MD;  Location: [facility] OR;  Service: Orthopedics;  Laterality: Right;   TOTAL KNEE ARTHROPLASTY Left 06/29/2023   Procedure: LEFT TOTAL KNEE REPLACEMENT;  Surgeon: Wes Hamman, MD;  Location: [facility] OR;  Service: Orthopedics;  Laterality: Left;   Patient Active Problem List   Diagnosis Date Noted   Primary osteoarthritis of left knee 06/29/2023   Status post total left knee replacement 06/29/2023   Chronic low back pain 02/11/2018   Alcohol abuse 02/16/2012   HTN (hypertension) 02/09/2012   GERD (gastroesophageal reflux disease) 02/09/2012   Tobacco use 02/09/2012   Indirect hyperbilirubinemia 02/09/2012    PCP: [facility]  REFERRING PROVIDER: Sandie Cross, PA-C  REFERRING DIAG: [phone]  (ICD-10-CM) - Status post total left knee replacement  THERAPY DIAG:  Acute pain of left knee  Stiffness of left knee, not elsewhere classified  Muscle weakness (generalized)  Localized edema  Other abnormalities of gait and mobility  Rationale for Evaluation and Treatment: Rehabilitation  ONSET DATE: 06/29/23 Left TKA  SUBJECTIVE:   SUBJECTIVE STATEMENT: He felt good yesterday but woke up today with more overall pain and stiffness and not sure why  PERTINENT HISTORY: Left TKA (06/29/23), Shoulder arthroscopy w/ RTC repair (03/16/20), Right TKA, L knee OA, Anxiety, arthritis, GERD, HTN, PTSD, Major depressive disorder  PAIN:  NPRS scale: 8/10 today upon arrival Pain location: medial side of L knee, no calf pain, mild inguinal  Pain description: constant dull pain, sharp, muscle spasms Aggravating factors: use of knee, lack of meds, initial standing /walking Relieving factors: ice and rest  PRECAUTIONS: Fall  WEIGHT BEARING RESTRICTIONS:  WBAT  FALLS:  Has patient fallen in last 6 months? No  LIVING ENVIRONMENT: Lives with: lives alone Lives in: House/apartment Stairs: Yes: External: 1 steps; none, back steps: 3 steps without rail Has following equipment at home: Single point cane and Walker - 2 wheeled  OCCUPATION: retired, on disability  LEISURE ACTIVITIES: staying healthy   PLOF: Independent  PATIENT GOALS:  To be pain free and be  more independent   Next MD visit: around 08/11/23 Dr. Christiane Cowing  OBJECTIVE:  DIAGNOSTIC FINDINGS: 06/29/23 XRAY Left knee arthroplasty in expected alignment. No periprosthetic lucency or fracture. Recent postsurgical change includes air and edema in the soft tissues and joint space.  PATIENT SURVEYS:  FOTO intake:  31%  predicted:  47%  COGNITION: Overall cognitive status: WFL    SENSATION: WFL  EDEMA:  LLE: above knee 43.5 cm,  around knee 43.25 cm, below knee  39.5 cm gastroc apex 36.5 cm RLE: above knee 40 cm,  around knee  35.5 cm, below knee  33 cm gastroc apex 34 cm  POSTURE:  Standing: flexed trunk with slightly rounded shoulders, weight shift to right Seated: rounded shoulders, keeps L knee outstretched due to pain with knee flexion requiring 90* or greater  PALPATION: Medial L knee tenderness, no quad tenderness Denies lateral knee pain or calf pain Knee swelling with calor and rubor of incision site Verbalizes difference in sensation of lateral area of knee   LOWER EXTREMITY ROM:   ROM P: passive  A:active Left eval Left 07/29/23  Hip flexion    Hip extension    Hip abduction    Hip adduction    Hip internal rotation    Hip external rotation    Knee flexion Seated A: 89* P: 94* Painful end feel Supine A:  91* Pt declined passive Supine A:105 P:111  Knee extension Standing A: -16* P: -5* painful  end feel  Seated LAQ -38* Supine  Quad set -10* P: -4* Supine: A:-6  Ankle dorsiflexion    Ankle plantarflexion    Ankle inversion    Ankle eversion     (Blank rows = not tested)  LOWER EXTREMITY MMT:  MMT Right eval Left eval  Hip flexion    Hip extension    Hip abduction    Hip adduction    Hip internal rotation    Hip external rotation    Knee flexion  3-/5  Knee extension  3-/5  Ankle dorsiflexion    Ankle plantarflexion    Ankle inversion    Ankle eversion     (Blank rows = not tested)  FUNCTIONAL TESTS:  18 inch chair transfer: use of BUE on armrest and use of walker; keeps L leg in front to prevent increased knee flexion when standing and sitting  GAIT: Distance walked: 82' x2  Assistive device utilized: Environmental consultant - 2 wheeled Level of assistance: supervision / needs cues for deviations Comments: antalgic with decreased single leg stance time, bilateral internal rotation (toe-in) gait with L>R,  L knee flexion in stance with minimal to no increase for swing, appropriate initial contact with heel strike bilaterally, decreased step length,    TODAY'S TREATMENT                                                                           DATE:  07/29/23 Therex: Sci fit bike seat #12, full revolutions X 8 min L5 Leg press DL 19# [id number], Left leg 29# X10 Slantboard stretch 30 sec X 3 Standing knee flexion lunge stretch 5 sec  X10 with Lt foot on treadmill Seated LAQ 3# 2X10 Gait around clinic with SPC and supervision 50  feet X 3 Supine quad sets with heel prop 5 sec X10  Manual: Seated knee flexion PROM with overpressuere Supine knee extension PROM with overpressure   Vaso at knee with elevation 34* medium compression  07/27/23 Therex: Nu step for increased ROM and endurance seat 13 level 1 Seated knee extension and flexion with 5" hold and use of non-surgical leg to assist past patients L AROM   Manual: Seated knee flexion with hold at patient end range Knee P/A grade II-III mobs to tolerance for facilitation of extension  Self-Care: PT instructed pt with verbal & demo cues on ice massage and scar mobilizations.  Pt was able to verbalize and return demo understanding.   Vaso at knee with elevation 34* medium compression   TREATMENT                                                                          DATE: 07/22/23 Vaso at dual hose for ankle and knee with elevation 34* medium compression HEP exercises with demo, tc, and vc provided for proper form and to reduce instances of pain. Patient verbalized and demonstrated understanding of HEP and its use.  PT educated on elevation >/=2x/day for >/=15 min with ankle muscle activity.   Therex:    HEP instruction/performance c cues for techniques, handout provided.  Trial set performed of each for comprehension and symptom assessment.  See below for exercise list  PATIENT EDUCATION:  Education details: HEP, POC, elevation Person educated: Patient Education method: Explanation, Demonstration, Verbal cues, and Handouts Education comprehension: verbalized understanding, returned  demonstration, and verbal cues required  HOME EXERCISE PROGRAM: Medbridge Code: [id number]  Exercises - Ankle Alphabet in Elevation  - 2-4 x daily - 7 x weekly - 1 sets - 1 reps - Quad Setting and Stretching  - 2-4 x daily - 7 x weekly - 5-10 sets - 10 reps - prop 5-10 minutes & quad set5 seconds hold - Supine Heel Slide with Strap  - 2-3 x daily - 7 x weekly - 2-3 sets - 10 reps - 5 seconds hold - Supine Straight Leg Raises  - 2-3 x daily - 7 x weekly - 2-3 sets - 10 reps - 5 seconds hold - Seated Knee Flexion Extension AROM   - 2-4 x daily - 7 x weekly - 2-3 sets - 10 reps - 5 seconds hold - Seated straight leg lifts  - 2-3 x daily - 7 x weekly - 2-3 sets - 10 reps - 5 seconds hold - Seated Hamstring Stretch with Strap  - 2-4 x daily - 7 x weekly - 1 sets - 3 reps - 30 seconds hold - Standing Quad Set  - 2-3 x daily - 7 x weekly - 2 sets - 10 reps - 5 seconds hold -Ice massage  -Scar mobilization   ASSESSMENT: CLINICAL IMPRESSION: He showed improved knee ROM today with updated measurements. He had more overall pain upon arrival today but despite this tolerated the exercises and manual therapy very well. Patient will benefit from continued skilled physical therapy to address deficits.   OBJECTIVE IMPAIRMENTS: Abnormal gait, decreased activity tolerance, decreased balance, decreased endurance, decreased knowledge of condition, decreased knowledge of use  of DME, decreased mobility, difficulty walking, decreased ROM, decreased strength, increased edema, impaired sensation, postural dysfunction, and pain.   ACTIVITY LIMITATIONS: carrying, lifting, bending, sitting, standing, squatting, sleeping, stairs, transfers, and locomotion level  PARTICIPATION LIMITATIONS: meal prep, cleaning, laundry, and community activity  PERSONAL FACTORS: Time since onset of injury/illness/exacerbation, 3+ comorbidities: see PMH, and Decreased pain medication use with increased pain and interference of sleep  are  also affecting patient's functional outcome.   REHAB POTENTIAL: Excellent  CLINICAL DECISION MAKING: Stable/uncomplicated  EVALUATION COMPLEXITY: Low   GOALS: Goals reviewed with patient? Yes  SHORT TERM GOALS: (target date for Short term goals 08/19/23)   1.  Patient will demonstrate independent use of home exercise program to maintain progress from in clinic treatments. Baseline: See objective data Goal status: On going  2. PROM left knee -5* ext to 105* flex to facilitate proper gait mechanics  Baseline: See objective data Goal status: On going  3. Interim FOTO 37% Baseline: See objective data Goal status: Initial  LONG TERM GOALS: (target dates for all long term goals  10/02/23 )   1. Patient will demonstrate/report pain at worst less than or equal to 2/10 to facilitate minimal limitation in daily activity secondary to pain symptoms. Baseline: See objective data Goal status: Initial   2. Patient will demonstrate independent use of home exercise program to facilitate ability to maintain/progress functional gains from skilled physical therapy services. Baseline: See objective data Goal status: Initial   3. Patient will demonstrate FOTO outcome > or = 47 % to indicate reduced disability due to condition. Baseline: See objective data Goal status: Initial   4.  Patient will demonstrate left knee LE MMT 5/5 throughout to faciltiate usual transfers, stairs, squatting at [facility] for daily life.  Baseline: See objective data Goal status: Initial   5.  Patient will demonstrate left knee AROM 0* to 110* for higher functional demands for daily activities Baseline: See objective data Goal status: Initial   6.  Patient will ambulate >558ft and negotiate ramps, curbs & stairs single rail independently for safety with community ambulation needs Baseline: See objective data Goal status: Initial   PLAN:  PT FREQUENCY:  2x/week  PT DURATION: 10 weeks  PLANNED INTERVENTIONS:  97164- PT Re-evaluation, 97110-Therapeutic exercises, 97530- Therapeutic activity, 97112- Neuromuscular re-education, 97535- Self Care, [id number]- Manual therapy, [id number]- Gait training, [id number]- Electrical stimulation (manual), 97016- Vasopneumatic device, Patient/Family education, Balance training, Stair training, Taping, Joint mobilization, Scar mobilization, DME instructions, and Cryotherapy  PLAN FOR NEXT SESSION: Gastroc stretch, lunges on elevated surface for knee flexion, marching for balance and active flexion, TKE for increased activation of quads for inproved gait, step ups, and/or mini squats.  Continue with manual therapy & therapeutic exercise for knee flexion and extension. Continue calf stretching for prevention of increased stiffness. Increased ambulation and gait training for decreased DVT risk and improved overall function of L knee.   Mick Alamin, PT, DPT 07/29/2023, 8:33 AM

## 2023-08-03 ENCOUNTER — Encounter: Payer: Self-pay | Admitting: Physical Therapy

## 2023-08-03 ENCOUNTER — Ambulatory Visit (INDEPENDENT_AMBULATORY_CARE_PROVIDER_SITE_OTHER): Payer: No Typology Code available for payment source | Admitting: Physical Therapy

## 2023-08-03 DIAGNOSIS — M25562 Pain in left knee: Secondary | ICD-10-CM | POA: Diagnosis not present

## 2023-08-03 DIAGNOSIS — M6281 Muscle weakness (generalized): Secondary | ICD-10-CM | POA: Diagnosis not present

## 2023-08-03 DIAGNOSIS — M25662 Stiffness of left knee, not elsewhere classified: Secondary | ICD-10-CM | POA: Diagnosis not present

## 2023-08-03 DIAGNOSIS — R600 Localized edema: Secondary | ICD-10-CM

## 2023-08-03 DIAGNOSIS — R2689 Other abnormalities of gait and mobility: Secondary | ICD-10-CM

## 2023-08-03 NOTE — Therapy (Addendum)
OUTPATIENT PHYSICAL THERAPY TREATMENT  Patient Name: Justin Nolan MRN: [id number] DOB:08/10/1960, 63 y.o., male Today's Date: 08/03/2023  END OF SESSION:  PT End of Session - 08/03/23 0937     Visit Number 4    Number of Visits 20    Date for PT Re-Evaluation 10/02/23    Authorization Type [facility]    Authorization Time Period 15 visits    Authorization - Visit Number 4    Authorization - Number of Visits 15    Progress Note Due on Visit 10    PT Start Time 0930    PT Stop Time 1013    PT Time Calculation (min) 43 min    Activity Tolerance Patient tolerated treatment well    Behavior During Therapy WFL for tasks assessed/performed             Past Medical History:  Diagnosis Date   Anxiety    Arthritis    BPH (benign prostatic hyperplasia)    GERD (gastroesophageal reflux disease)    Hypertension    Major depressive disorder    Pneumonia 02/09/2012   PTSD (post-traumatic stress disorder)    Sleep apnea    does not wear cpap   Past Surgical History:  Procedure Laterality Date   KNEE SURGERY     ROTATOR CUFF REPAIR Left 2020   TOTAL KNEE ARTHROPLASTY Right 05/07/2017   Procedure: RIGHT TOTAL KNEE ARTHROPLASTY;  Surgeon: Tarry Kos, MD;  Location: [facility] OR;  Service: Orthopedics;  Laterality: Right;   TOTAL KNEE ARTHROPLASTY Left 06/29/2023   Procedure: LEFT TOTAL KNEE REPLACEMENT;  Surgeon: Tarry Kos, MD;  Location: [facility] OR;  Service: Orthopedics;  Laterality: Left;   Patient Active Problem List   Diagnosis Date Noted   Primary osteoarthritis of left knee 06/29/2023   Status post total left knee replacement 06/29/2023   Chronic low back pain 02/11/2018   Alcohol abuse 02/16/2012   HTN (hypertension) 02/09/2012   GERD (gastroesophageal reflux disease) 02/09/2012   Tobacco use 02/09/2012   Indirect hyperbilirubinemia 02/09/2012    [facility]P: [facility]  REFERRING PROVIDER: Ivin Poot  REFERRING DIAG: [phone] (ICD-10-CM) - Status post  total left knee replacement  THERAPY DIAG:  Acute pain of left knee  Stiffness of left knee, not elsewhere classified  Muscle weakness (generalized)  Localized edema  Other abnormalities of gait and mobility  Rationale for Evaluation and Treatment: Rehabilitation  ONSET DATE: 06/29/23 Left TKA  SUBJECTIVE:   SUBJECTIVE STATEMENT: Patient stated that he finally was able to get out of the house to go grocery shopping. Feels better and more comfortable walking around at home since he has some places to lean on. Used a hand basket in the store instead of a cart and was able to get around without issue. Has been using ice more since he is not taking medicine. Has been able to sleep in his bed without increased knee pain.    PERTINENT HISTORY: Left TKA (06/29/23), Shoulder arthroscopy w/ RTC repair (03/16/20), Right TKA, L knee OA, Anxiety, arthritis, GERD, HTN, PTSD, Major depressive disorder  PAIN:  NPRS scale: 5/10 today upon arrival; not higher than a 5 at worst.  Pain location: medial side of L knee, no calf pain, mild inguinal  Pain description: constant dull pain, sharp, muscle spasms Aggravating factors: use of knee, lack of meds, initial standing /walking Relieving factors: ice and rest  PRECAUTIONS: Fall  WEIGHT BEARING RESTRICTIONS:  WBAT  FALLS:  Has patient fallen  in last 6 months? No  LIVING ENVIRONMENT: Lives with: lives alone Lives in: House/apartment Stairs: Yes: External: 1 steps; none, back steps: 3 steps without rail Has following equipment at home: Single point cane and Walker - 2 wheeled  OCCUPATION: retired, on disability  LEISURE ACTIVITIES: staying healthy   PLOF: Independent  PATIENT GOALS:  To be pain free and be more independent   Next MD visit: around 08/11/23 Dr. Roda Shutters  OBJECTIVE:  DIAGNOSTIC FINDINGS: 06/29/23 XRAY Left knee arthroplasty in expected alignment. No periprosthetic lucency or fracture. Recent postsurgical change includes air  and edema in the soft tissues and joint space.  PATIENT SURVEYS:  FOTO intake:  31%  predicted:  47%  COGNITION: Overall cognitive status: WFL    SENSATION: WFL  EDEMA:  LLE: above knee 43.5 cm,  around knee 43.25 cm, below knee  39.5 cm gastroc apex 36.5 cm RLE: above knee 40 cm,  around knee 35.5 cm, below knee  33 cm gastroc apex 34 cm  POSTURE:  Standing: flexed trunk with slightly rounded shoulders, weight shift to right Seated: rounded shoulders, keeps L knee outstretched due to pain with knee flexion requiring 90* or greater  PALPATION: Medial L knee tenderness, no quad tenderness Denies lateral knee pain or calf pain Knee swelling with calor and rubor of incision site Verbalizes difference in sensation of lateral area of knee   LOWER EXTREMITY ROM:   ROM P: passive  A:active Left eval Left 07/29/23 Left  08/03/23  Hip flexion     Hip extension     Hip abduction     Hip adduction     Hip internal rotation     Hip external rotation     Knee flexion Seated A: 89* P: 94* Painful end feel Supine A:  91* Pt declined passive Supine A:105 P:111 Standing //bar and 8in step: P:109*  On leg press: P:115*  Knee extension Standing A: -16* P: -5* painful  end feel  Seated LAQ -38* Supine  Quad set -10* P: -4* Supine: A:-6   Ankle dorsiflexion     Ankle plantarflexion     Ankle inversion     Ankle eversion      (Blank rows = not tested)  LOWER EXTREMITY MMT:  MMT Right eval Left eval  Hip flexion    Hip extension    Hip abduction    Hip adduction    Hip internal rotation    Hip external rotation    Knee flexion  3-/5  Knee extension  3-/5  Ankle dorsiflexion    Ankle plantarflexion    Ankle inversion    Ankle eversion     (Blank rows = not tested)  FUNCTIONAL TESTS:  18 inch chair transfer: use of BUE on armrest and use of walker; keeps L leg in front to prevent increased knee flexion when standing and sitting  GAIT: Distance walked:  36' x2  Assistive device utilized: Environmental consultant - 2 wheeled Level of assistance: supervision / needs cues for deviations Comments: antalgic with decreased single leg stance time, bilateral internal rotation (toe-in) gait with L>R,  L knee flexion in stance with minimal to no increase for swing, appropriate initial contact with heel strike bilaterally, decreased step length,   TODAY'S TREATMENT  DATE:  08/03/23 Therex: Sci fit bike seat #12, full revolutions, 8 min, L6 BLEs only Slantboard gastroc stretch 30 sec x 4 Standing knee flexion lunge stretch 10x10sec hold with Lt foot on 8in stool Leg press bilateral 50# 2X10, L LE 25# 2x10 Seated LAQ 3# 2X10  Manual: Seated scar mobilizations with and without instruments for facilitation of mobility and prevention of adhesions       Declined vaso due to appointment following PT.   TREATMENT                                                                          DATE:  07/29/23 Therex: Sci fit bike seat #12, full revolutions X 8 min L5 Leg press DL 62# 9B28, Left leg 41# X10 Slantboard stretch 30 sec X 3 Standing knee flexion lunge stretch 5 sec  X10 with Lt foot on treadmill Seated LAQ 3# 2X10 Gait around clinic with SPC and supervision 50 feet X 3 Supine quad sets with heel prop 5 sec X10  Manual: Seated knee flexion PROM with overpressuere Supine knee extension PROM with overpressure   Vaso at knee with elevation 34* medium compression  07/27/23 Therex: Nu step for increased ROM and endurance seat 13 level 1 Seated knee extension and flexion with 5" hold and use of non-surgical leg to assist past patients L AROM   Manual: Seated knee flexion with hold at patient end range Knee P/A grade II-III mobs to tolerance for facilitation of extension  Self-Care: PT instructed pt with verbal & demo cues on ice massage and scar mobilizations.  Pt was able to  verbalize and return demo understanding.   Vaso at knee with elevation 34* medium compression    PATIENT EDUCATION:  Education details: HEP, POC, elevation Person educated: Patient Education method: Programmer, multimedia, Demonstration, Verbal cues, and Handouts Education comprehension: verbalized understanding, returned demonstration, and verbal cues required  HOME EXERCISE PROGRAM: Medbridge Code: [id number]  Exercises - Ankle Alphabet in Elevation  - 2-4 x daily - 7 x weekly - 1 sets - 1 reps - Quad Setting and Stretching  - 2-4 x daily - 7 x weekly - 5-10 sets - 10 reps - prop 5-10 minutes & quad set5 seconds hold - Supine Heel Slide with Strap  - 2-3 x daily - 7 x weekly - 2-3 sets - 10 reps - 5 seconds hold - Supine Straight Leg Raises  - 2-3 x daily - 7 x weekly - 2-3 sets - 10 reps - 5 seconds hold - Seated Knee Flexion Extension AROM   - 2-4 x daily - 7 x weekly - 2-3 sets - 10 reps - 5 seconds hold - Seated straight leg lifts  - 2-3 x daily - 7 x weekly - 2-3 sets - 10 reps - 5 seconds hold - Seated Hamstring Stretch with Strap  - 2-4 x daily - 7 x weekly - 1 sets - 3 reps - 30 seconds hold - Standing Quad Set  - 2-3 x daily - 7 x weekly - 2 sets - 10 reps - 5 seconds hold -Ice massage  -Scar mobilization   ASSESSMENT: CLINICAL IMPRESSION: Patient tolerated treatment well with increased flexion PROM after activities  to encourage knee flexion. Patient continues to demonstrate a lack of quad strength as well as persistent edema which hinder knee extension ROM.  Patient will benefit from continued skilled physical therapy to address deficits.   OBJECTIVE IMPAIRMENTS: Abnormal gait, decreased activity tolerance, decreased balance, decreased endurance, decreased knowledge of condition, decreased knowledge of use of DME, decreased mobility, difficulty walking, decreased ROM, decreased strength, increased edema, impaired sensation, postural dysfunction, and pain.   ACTIVITY  LIMITATIONS: carrying, lifting, bending, sitting, standing, squatting, sleeping, stairs, transfers, and locomotion level  PARTICIPATION LIMITATIONS: meal prep, cleaning, laundry, and community activity  PERSONAL FACTORS: Time since onset of injury/illness/exacerbation, 3+ comorbidities: see PMH, and Decreased pain medication use with increased pain and interference of sleep  are also affecting patient's functional outcome.   REHAB POTENTIAL: Excellent  CLINICAL DECISION MAKING: Stable/uncomplicated  EVALUATION COMPLEXITY: Low   GOALS: Goals reviewed with patient? Yes  SHORT TERM GOALS: (target date for Short term goals 08/19/23)   1.  Patient will demonstrate independent use of home exercise program to maintain progress from in clinic treatments. Baseline: See objective data Goal status: On going  2. PROM left knee -5* ext to 105* flex to facilitate proper gait mechanics  Baseline: See objective data Goal status: On going  3. Interim FOTO 37% Baseline: See objective data Goal status: Initial  LONG TERM GOALS: (target dates for all long term goals  10/02/23 )   1. Patient will demonstrate/report pain at worst less than or equal to 2/10 to facilitate minimal limitation in daily activity secondary to pain symptoms. Baseline: See objective data Goal status: Initial   2. Patient will demonstrate independent use of home exercise program to facilitate ability to maintain/progress functional gains from skilled physical therapy services. Baseline: See objective data Goal status: Initial   3. Patient will demonstrate FOTO outcome > or = 47 % to indicate reduced disability due to condition. Baseline: See objective data Goal status: Initial   4.  Patient will demonstrate left knee LE MMT 5/5 throughout to faciltiate usual transfers, stairs, squatting at [facility] for daily life.  Baseline: See objective data Goal status: Initial   5.  Patient will demonstrate left knee AROM 0* to 110* for  higher functional demands for daily activities Baseline: See objective data Goal status: Initial   6.  Patient will ambulate >565ft and negotiate ramps, curbs & stairs single rail independently for safety with community ambulation needs Baseline: See objective data Goal status: Initial   PLAN:  PT FREQUENCY:  2x/week  PT DURATION: 10 weeks  PLANNED INTERVENTIONS: 97164- PT Re-evaluation, 97110-Therapeutic exercises, 97530- Therapeutic activity, 97112- Neuromuscular re-education, 97535- Self Care, 86578- Manual therapy, [phone]- Gait training, [phone]- Electrical stimulation (manual), 97016- Vasopneumatic device, Patient/Family education, Balance training, Stair training, Taping, Joint mobilization, Scar mobilization, DME instructions, and Cryotherapy  PLAN FOR NEXT SESSION:  Progress to precor bike, exercises and activity for knee extension- add in long duration knee extension with stool, increase weight as tolerated for leg press, TKE for increased activation of quads for inproved gait, step ups, and/or mini squats. Continue manual therapy for knee flexion, extension, and edema management   Sadao Weyer, Yostin Malacara, Student-PT 08/03/2023, 10:43 AM  During this evaluation / treatment session, the Physical Therapist/Physical Therapist Assistant was present and participating in the session. This entire session was performed under direct supervision and direction of a licensed therapist/therapist assistant . I have personally read, edited and approve of the note as written.   Vladimir Faster, PT, DPT 08/03/2023, 11:01  AM

## 2023-08-05 ENCOUNTER — Encounter: Payer: No Typology Code available for payment source | Admitting: Physical Therapy

## 2023-08-10 ENCOUNTER — Encounter: Payer: Self-pay | Admitting: Physical Therapy

## 2023-08-10 ENCOUNTER — Ambulatory Visit (INDEPENDENT_AMBULATORY_CARE_PROVIDER_SITE_OTHER): Payer: No Typology Code available for payment source | Admitting: Physical Therapy

## 2023-08-10 DIAGNOSIS — M25662 Stiffness of left knee, not elsewhere classified: Secondary | ICD-10-CM

## 2023-08-10 DIAGNOSIS — M25562 Pain in left knee: Secondary | ICD-10-CM | POA: Diagnosis not present

## 2023-08-10 DIAGNOSIS — R2689 Other abnormalities of gait and mobility: Secondary | ICD-10-CM

## 2023-08-10 DIAGNOSIS — M6281 Muscle weakness (generalized): Secondary | ICD-10-CM | POA: Diagnosis not present

## 2023-08-10 DIAGNOSIS — R600 Localized edema: Secondary | ICD-10-CM | POA: Diagnosis not present

## 2023-08-10 NOTE — Therapy (Addendum)
OUTPATIENT PHYSICAL THERAPY TREATMENT  Patient Name: Justin Nolan MRN: [id number] DOB:03-Oct-1960, 63 y.o., male Today's Date: 08/10/2023  END OF SESSION:  PT End of Session - 08/10/23 0842     Visit Number 5    Number of Visits 20    Date for PT Re-Evaluation 10/02/23    Authorization Type [facility]    Authorization Time Period 15 visits    Authorization - Visit Number 5    Authorization - Number of Visits 15    Progress Note Due on Visit 10    PT Start Time [phone]    PT Stop Time 0928    PT Time Calculation (min) 45 min    Activity Tolerance Patient tolerated treatment well    Behavior During Therapy [facility] for tasks assessed/performed             Past Medical History:  Diagnosis Date   Anxiety    Arthritis    BPH (benign prostatic hyperplasia)    GERD (gastroesophageal reflux disease)    Hypertension    Major depressive disorder    Pneumonia 02/09/2012   PTSD (post-traumatic stress disorder)    Sleep apnea    does not wear cpap   Past Surgical History:  Procedure Laterality Date   KNEE SURGERY     ROTATOR CUFF REPAIR Left 2020   TOTAL KNEE ARTHROPLASTY Right 05/07/2017   Procedure: RIGHT TOTAL KNEE ARTHROPLASTY;  Surgeon: Tarry Kos, MD;  Location: [facility] OR;  Service: Orthopedics;  Laterality: Right;   TOTAL KNEE ARTHROPLASTY Left 06/29/2023   Procedure: LEFT TOTAL KNEE REPLACEMENT;  Surgeon: Tarry Kos, MD;  Location: [facility] OR;  Service: Orthopedics;  Laterality: Left;   Patient Active Problem List   Diagnosis Date Noted   Primary osteoarthritis of left knee 06/29/2023   Status post total left knee replacement 06/29/2023   Chronic low back pain 02/11/2018   Alcohol abuse 02/16/2012   HTN (hypertension) 02/09/2012   GERD (gastroesophageal reflux disease) 02/09/2012   Tobacco use 02/09/2012   Indirect hyperbilirubinemia 02/09/2012    PCP: [facility]  REFERRING PROVIDER: Rodolph Bong, MD  REFERRING DIAG: [phone] (ICD-10-CM) - Status post total  left knee replacement  THERAPY DIAG:  Acute pain of left knee  Stiffness of left knee, not elsewhere classified  Muscle weakness (generalized)  Localized edema  Other abnormalities of gait and mobility  Rationale for Evaluation and Treatment: Rehabilitation  ONSET DATE: 06/29/23 Left TKA  SUBJECTIVE:   SUBJECTIVE STATEMENT: Patient states that he had a good weekend and his knee is not giving him any trouble. Has most of his pain when he first stands up but it resolves quickly. Arrives with cane however after a few steps discontinues use and carries it instead.     PERTINENT HISTORY: Left TKA (06/29/23), Shoulder arthroscopy w/ RTC repair (03/16/20), Right TKA, L knee OA, Anxiety, arthritis, GERD, HTN, PTSD, Major depressive disorder  PAIN:  NPRS scale: 4/10 today upon arrival; not higher than a 5 at worst.  Pain location: medial side of L knee, no calf pain, mild inguinal  Pain description: constant dull pain, sharp, muscle spasms Aggravating factors: use of knee, lack of meds, initial standing /walking Relieving factors: ice and rest  PRECAUTIONS: Fall  WEIGHT BEARING RESTRICTIONS:  WBAT  FALLS:  Has patient fallen in last 6 months? No  LIVING ENVIRONMENT: Lives with: lives alone Lives in: House/apartment Stairs: Yes: External: 1 steps; none, back steps: 3 steps without rail Has following equipment  at home: Single point cane and Walker - 2 wheeled  OCCUPATION: retired, on disability  LEISURE ACTIVITIES: staying healthy   PLOF: Independent  PATIENT GOALS:  To be pain free and be more independent   Next MD visit: around 08/11/23 Dr. Roda Shutters  OBJECTIVE:  DIAGNOSTIC FINDINGS: 06/29/23 XRAY Left knee arthroplasty in expected alignment. No periprosthetic lucency or fracture. Recent postsurgical change includes air and edema in the soft tissues and joint space.  PATIENT SURVEYS:  FOTO intake:  31%  predicted:  47%  COGNITION: Overall cognitive status:  WFL    SENSATION: WFL  EDEMA:  LLE: above knee 43.5 cm,  around knee 43.25 cm, below knee  39.5 cm gastroc apex 36.5 cm RLE: above knee 40 cm,  around knee 35.5 cm, below knee  33 cm gastroc apex 34 cm  POSTURE:  Standing: flexed trunk with slightly rounded shoulders, weight shift to right Seated: rounded shoulders, keeps L knee outstretched due to pain with knee flexion requiring 90* or greater  PALPATION: Medial L knee tenderness, no quad tenderness Denies lateral knee pain or calf pain Knee swelling with calor and rubor of incision site Verbalizes difference in sensation of lateral area of knee   LOWER EXTREMITY ROM:   ROM P: passive  A:active Left eval Left 07/29/23 Left  08/03/23  Hip flexion     Hip extension     Hip abduction     Hip adduction     Hip internal rotation     Hip external rotation     Knee flexion Seated A: 89* P: 94* Painful end feel Supine A:  91* Pt declined passive Supine A:105 P:111 Standing //bar and 8in step: P:109*  On leg press: P:115*  Knee extension Standing A: -16* P: -5* painful  end feel  Seated LAQ -38* Supine  Quad set -10* P: -4* Supine: A:-6   Ankle dorsiflexion     Ankle plantarflexion     Ankle inversion     Ankle eversion      (Blank rows = not tested)  LOWER EXTREMITY MMT:  MMT Right eval Left eval  Hip flexion    Hip extension    Hip abduction    Hip adduction    Hip internal rotation    Hip external rotation    Knee flexion  3-/5  Knee extension  3-/5  Ankle dorsiflexion    Ankle plantarflexion    Ankle inversion    Ankle eversion     (Blank rows = not tested)  FUNCTIONAL TESTS:  18 inch chair transfer: use of BUE on armrest and use of walker; keeps L leg in front to prevent increased knee flexion when standing and sitting  GAIT: Distance walked: 7' x2  Assistive device utilized: Environmental consultant - 2 wheeled Level of assistance: supervision / needs cues for deviations Comments: antalgic with  decreased single leg stance time, bilateral internal rotation (toe-in) gait with L>R,  L knee flexion in stance with minimal to no increase for swing, appropriate initial contact with heel strike bilaterally, decreased step length,   TODAY'S TREATMENT  Date: 08/10/23 Therex: Sci fit bike seat #9, full revolutions, 8 min, L6 BLEs only Slantboard gastroc stretch 30 sec x 4 LLE TKE Blue Theraband x10 5sec hold; vc and tc for proper quad activation  LLE TKE Blue Theraband  x10 with hold while RLE taps object on floor. Promotion of knee extension during SL stance; vc for reaching and maintaining full extension Leg press bilateral 75# 2X10, L LE 50# 2x10; vc for proper form and weight to prevent injury or over working of knee musculature Seated LAQ with strap assist at terminal extension x10 with 5sec hold; vc for AROM before use of strap to assist to reach full range   Manual: Seated scar mobilizations with and without instruments for facilitation of mobility and prevention of adhesions       Declined vaso   TREATMENT                                                                          DATE:  08/03/23 Therex: Sci fit bike seat #12, full revolutions, 8 min, L6 BLEs only Slantboard gastroc stretch 30 sec x 4 Standing knee flexion lunge stretch 10x10sec hold with Lt foot on 8in stool Leg press bilateral 50# 2X10, L LE 25# 2x10 Seated LAQ 3# 2X10  Manual: Seated scar mobilizations with and without instruments for facilitation of mobility and prevention of adhesions       Declined vaso due to appointment following PT.   TREATMENT                                                                          DATE:  07/29/23 Therex: Sci fit bike seat #12, full revolutions X 8 min L5 Leg press DL 52# 8U13, Left leg 24# X10 Slantboard stretch 30 sec X 3 Standing knee flexion lunge stretch 5 sec  X10 with Lt foot on  treadmill Seated LAQ 3# 2X10 Gait around clinic with SPC and supervision 50 feet X 3 Supine quad sets with heel prop 5 sec X10  Manual: Seated knee flexion PROM with overpressuere Supine knee extension PROM with overpressure   Vaso at knee with elevation 34* medium compression   PATIENT EDUCATION:  Education details: HEP, POC, elevation Person educated: Patient Education method: Programmer, multimedia, Demonstration, Verbal cues, and Handouts Education comprehension: verbalized understanding, returned demonstration, and verbal cues required  HOME EXERCISE PROGRAM: Medbridge Code: [id number]  Exercises - Ankle Alphabet in Elevation  - 2-4 x daily - 7 x weekly - 1 sets - 1 reps - Quad Setting and Stretching  - 2-4 x daily - 7 x weekly - 5-10 sets - 10 reps - prop 5-10 minutes & quad set5 seconds hold - Supine Heel Slide with Strap  - 2-3 x daily - 7 x weekly - 2-3 sets - 10 reps - 5 seconds hold - Supine Straight Leg Raises  - 2-3 x daily - 7 x weekly - 2-3 sets - 10 reps -  5 seconds hold - Seated Knee Flexion Extension AROM   - 2-4 x daily - 7 x weekly - 2-3 sets - 10 reps - 5 seconds hold - Seated straight leg lifts  - 2-3 x daily - 7 x weekly - 2-3 sets - 10 reps - 5 seconds hold - Seated Hamstring Stretch with Strap  - 2-4 x daily - 7 x weekly - 1 sets - 3 reps - 30 seconds hold - Standing Quad Set  - 2-3 x daily - 7 x weekly - 2 sets - 10 reps - 5 seconds hold -Ice massage  -Scar mobilization   ASSESSMENT: CLINICAL IMPRESSION: Patient did well with activity today however needed to be reminded to not over do or rush activity to prevent injury. Patient demonstrated increased strength however ROM and quad strength continue to hinder proper gait mechanics. Patient will benefit from continued skilled physical therapy to address gait concerns, strength and ROM deficits.   OBJECTIVE IMPAIRMENTS: Abnormal gait, decreased activity tolerance, decreased balance, decreased endurance, decreased  knowledge of condition, decreased knowledge of use of DME, decreased mobility, difficulty walking, decreased ROM, decreased strength, increased edema, impaired sensation, postural dysfunction, and pain.   ACTIVITY LIMITATIONS: carrying, lifting, bending, sitting, standing, squatting, sleeping, stairs, transfers, and locomotion level  PARTICIPATION LIMITATIONS: meal prep, cleaning, laundry, and community activity  PERSONAL FACTORS: Time since onset of injury/illness/exacerbation, 3+ comorbidities: see PMH, and Decreased pain medication use with increased pain and interference of sleep  are also affecting patient's functional outcome.   REHAB POTENTIAL: Excellent  CLINICAL DECISION MAKING: Stable/uncomplicated  EVALUATION COMPLEXITY: Low   GOALS: Goals reviewed with patient? Yes  SHORT TERM GOALS: (target date for Short term goals 08/19/23)   1.  Patient will demonstrate independent use of home exercise program to maintain progress from in clinic treatments. Baseline: See objective data Goal status: On going 08/10/2023  2. PROM left knee -5* ext to 105* flex to facilitate proper gait mechanics  Baseline: See objective data Goal status: On going 08/10/2023  3. Interim FOTO 37% Baseline: See objective data Goal status:  On going 08/10/2023  LONG TERM GOALS: (target dates for all long term goals  10/02/23 )   1. Patient will demonstrate/report pain at worst less than or equal to 2/10 to facilitate minimal limitation in daily activity secondary to pain symptoms. Baseline: See objective data Goal status: On going 08/10/2023   2. Patient will demonstrate independent use of home exercise program to facilitate ability to maintain/progress functional gains from skilled physical therapy services. Baseline: See objective data Goal status: On going 08/10/2023   3. Patient will demonstrate FOTO outcome > or = 47 % to indicate reduced disability due to condition. Baseline: See objective data Goal  status: On going 08/10/2023   4.  Patient will demonstrate left knee LE MMT 5/5 throughout to faciltiate usual transfers, stairs, squatting at [facility] for daily life.  Baseline: See objective data Goal status: On going 08/10/2023   5.  Patient will demonstrate left knee AROM 0* to 110* for higher functional demands for daily activities Baseline: See objective data Goal status: On going 08/10/2023   6.  Patient will ambulate >531ft and negotiate ramps, curbs & stairs single rail independently for safety with community ambulation needs Baseline: See objective data Goal status: On going 08/10/2023   PLAN:  PT FREQUENCY:  2x/week  PT DURATION: 10 weeks  PLANNED INTERVENTIONS: 97164- PT Re-evaluation, 97110-Therapeutic exercises, 97530- Therapeutic activity, 97112- Neuromuscular re-education, 97535- Self Care, 16109-  Manual therapy, [id number]- Gait training, 16109- Electrical stimulation (manual), [phone]- Vasopneumatic device, Patient/Family education, Balance training, Stair training, Taping, Joint mobilization, Scar mobilization, DME instructions, and Cryotherapy  PLAN FOR NEXT SESSION:  Address gait deviations, continue exercises and activity for active knee extension- possibly add in long duration knee extension with stool, increase weight as tolerated for leg press, step ups, and/or mini squats for quad control. Continue manual therapy for knee flexion, extension, and edema management   Kaipo Ardis, Romon Devereux, Student-PT 08/10/2023, 11:52 AM  This entire session of physical therapy was performed under the direct supervision of PT signing evaluation /treatment. PT reviewed note and agrees.   Vladimir Faster, PT, DPT 08/10/2023, 12:53 PM

## 2023-08-12 ENCOUNTER — Ambulatory Visit (INDEPENDENT_AMBULATORY_CARE_PROVIDER_SITE_OTHER): Payer: No Typology Code available for payment source | Admitting: Physical Therapy

## 2023-08-12 ENCOUNTER — Encounter: Payer: Self-pay | Admitting: Physical Therapy

## 2023-08-12 DIAGNOSIS — M25562 Pain in left knee: Secondary | ICD-10-CM | POA: Diagnosis not present

## 2023-08-12 DIAGNOSIS — M25662 Stiffness of left knee, not elsewhere classified: Secondary | ICD-10-CM

## 2023-08-12 DIAGNOSIS — R600 Localized edema: Secondary | ICD-10-CM | POA: Diagnosis not present

## 2023-08-12 DIAGNOSIS — M6281 Muscle weakness (generalized): Secondary | ICD-10-CM

## 2023-08-12 DIAGNOSIS — R2689 Other abnormalities of gait and mobility: Secondary | ICD-10-CM

## 2023-08-12 NOTE — Therapy (Addendum)
OUTPATIENT PHYSICAL THERAPY TREATMENT  Patient Name: Justin Nolan MRN: [id number] DOB:[birth year], 63 y.o., male Today's Date: 08/12/2023  END OF SESSION:  PT End of Session - 08/12/23 0926     Visit Number 6    Number of Visits 20    Date for PT Re-Evaluation 10/02/23    Authorization Type [facility]    Authorization Time Period 15 visits    Authorization - Visit Number 6    Authorization - Number of Visits 15    Progress Note Due on Visit 10    PT Start Time 0927    PT Stop Time 1012    PT Time Calculation (min) 45 min    Equipment Utilized During Treatment Gait belt    Activity Tolerance Patient tolerated treatment well    Behavior During Therapy WFL for tasks assessed/performed              Past Medical History:  Diagnosis Date   Anxiety    Arthritis    BPH (benign prostatic hyperplasia)    GERD (gastroesophageal reflux disease)    Hypertension    Major depressive disorder    Pneumonia 02/09/2012   PTSD (post-traumatic stress disorder)    Sleep apnea    does not wear cpap   Past Surgical History:  Procedure Laterality Date   KNEE SURGERY     ROTATOR CUFF REPAIR Left 2020   TOTAL KNEE ARTHROPLASTY Right 05/07/2017   Procedure: RIGHT TOTAL KNEE ARTHROPLASTY;  Surgeon: Tarry Kos, MD;  Location: [facility] OR;  Service: Orthopedics;  Laterality: Right;   TOTAL KNEE ARTHROPLASTY Left 06/29/2023   Procedure: LEFT TOTAL KNEE REPLACEMENT;  Surgeon: Tarry Kos, MD;  Location: [facility] OR;  Service: Orthopedics;  Laterality: Left;   Patient Active Problem List   Diagnosis Date Noted   Primary osteoarthritis of left knee 06/29/2023   Status post total left knee replacement 06/29/2023   Chronic low back pain 02/11/2018   Alcohol abuse 02/16/2012   HTN (hypertension) 02/09/2012   GERD (gastroesophageal reflux disease) 02/09/2012   Tobacco use 02/09/2012   Indirect hyperbilirubinemia 02/09/2012    [facility]P: [facility]  REFERRING PROVIDER: Tarry Kos,  MD  REFERRING DIAG: [phone] (ICD-10-CM) - Status post total left knee replacement  THERAPY DIAG:  Acute pain of left knee  Stiffness of left knee, not elsewhere classified  Muscle weakness (generalized)  Localized edema  Other abnormalities of gait and mobility  Rationale for Evaluation and Treatment: Rehabilitation  ONSET DATE: 06/29/23 Left TKA  SUBJECTIVE:   SUBJECTIVE STATEMENT: Reports not sleeping well last night due to not being able to get comfortable along with knee pain. Verbalizes stiffness with slight pain in the knee when rising to stand. Ambulates in with cane and antalgic gait.   States that he will most likely start back taking pain meds due to the increased pain.  PERTINENT HISTORY: Left TKA (06/29/23), Shoulder arthroscopy w/ RTC repair (03/16/20), Right TKA, L knee OA, Anxiety, arthritis, GERD, HTN, PTSD, Major depressive disorder  PAIN:  NPRS scale: 5-6/10 today upon arrival; not higher than a 6 at worst.  Pain location: medial side of L knee, no calf pain, mild inguinal  Pain description: constant dull pain, sharp, muscle spasms Aggravating factors: use of knee, lack of meds, initial standing /walking Relieving factors: ice and rest  PRECAUTIONS: Fall  WEIGHT BEARING RESTRICTIONS:  WBAT  FALLS:  Has patient fallen in last 6 months? No  LIVING ENVIRONMENT: Lives with: lives alone Lives  in: House/apartment Stairs: Yes: External: 1 steps; none, back steps: 3 steps without rail Has following equipment at home: Single point cane and Walker - 2 wheeled  OCCUPATION: retired, on disability  LEISURE ACTIVITIES: staying healthy   PLOF: Independent  PATIENT GOALS:  To be pain free and be more independent   Next MD visit: around 08/11/23 Dr. Roda Shutters  OBJECTIVE:  DIAGNOSTIC FINDINGS: 06/29/23 XRAY Left knee arthroplasty in expected alignment. No periprosthetic lucency or fracture. Recent postsurgical change includes air and edema in the soft tissues and  joint space.  PATIENT SURVEYS:  FOTO intake:  31%  predicted:  47%  COGNITION: Overall cognitive status: WFL    SENSATION: WFL  EDEMA:  LLE: above knee 43.5 cm,  around knee 43.25 cm, below knee  39.5 cm gastroc apex 36.5 cm RLE: above knee 40 cm,  around knee 35.5 cm, below knee  33 cm gastroc apex 34 cm  POSTURE:  Standing: flexed trunk with slightly rounded shoulders, weight shift to right Seated: rounded shoulders, keeps L knee outstretched due to pain with knee flexion requiring 90* or greater  PALPATION: Medial L knee tenderness, no quad tenderness Denies lateral knee pain or calf pain Knee swelling with calor and rubor of incision site Verbalizes difference in sensation of lateral area of knee   LOWER EXTREMITY ROM:   ROM P: passive  A:active Left eval Left 07/29/23 Left  08/03/23 Left  08/12/23  Hip flexion      Hip extension      Hip abduction      Hip adduction      Hip internal rotation      Hip external rotation      Knee flexion Seated A: 89* P: 94* Painful end feel Supine A:  91* Pt declined passive Supine A:105 P:111 Standing //bar and 8in step: P:109*  On leg press: P:115* Supine: A:113 P: 119  Knee extension Standing A: -16* P: -5* painful  end feel  Seated LAQ -38* Supine  Quad set -10* P: -4* Supine: A:-6  Supine with heel prop: A: -7 P: -1  Ankle dorsiflexion      Ankle plantarflexion      Ankle inversion      Ankle eversion       (Blank rows = not tested)  LOWER EXTREMITY MMT:  MMT Right eval Left eval  Hip flexion    Hip extension    Hip abduction    Hip adduction    Hip internal rotation    Hip external rotation    Knee flexion  3-/5  Knee extension  3-/5  Ankle dorsiflexion    Ankle plantarflexion    Ankle inversion    Ankle eversion     (Blank rows = not tested)  FUNCTIONAL TESTS:  Eval:  18 inch chair transfer: use of BUE on armrest and use of walker; keeps L leg in front to prevent increased knee  flexion when standing and sitting  GAIT: Eval: Distance walked: 68' x2  Assistive device utilized: Environmental consultant - 2 wheeled Level of assistance: supervision / needs cues for deviations Comments: antalgic with decreased single leg stance time, bilateral internal rotation (toe-in) gait with L>R,  L knee flexion in stance with minimal to no increase for swing, appropriate initial contact with heel strike bilaterally, decreased step length,   TODAY'S TREATMENT  Date: 08/12/23 Therex: Precor #9, full revolutions, 8 min, L4 Slantboard gastroc stretch 30 sec x 4 LLE TKE standing ball squeeze against wall x10 5sec hold; vc and tc for proper quad activation  Leg press bilateral L LE 50# 2x10; vc for proper form and control descent  Gait: -Bkwds walk x3 bouts for promotion of knee extension; vc for quad activation, cane used for stability, CGA for safety -Attempted hurdles however due to concerns with balance- activity was terminated -Hurdle with wall assist on L and cane on R x15 for knee flexion and quad activation with heel strike; vc for driving knee forward and for quad activation upon heel strike -Exaggerated heel strike with knee extension x2 bouts; VC for quad activation at heel strike, use of cane, CGA for safety   Manual: -Seated knee flexion with overpressure to patient tolerance for facilitation of movement -Supine knee extension with overpressure to patient tolerance for facilitation of extension and mobility. Patellar glides all directions included for increased knee mobility     TREATMENT                                                                           Date: 08/10/23 Therex: Sci fit bike seat #9, full revolutions, 8 min, L6 BLEs only Slantboard gastroc stretch 30 sec x 4 LLE TKE Blue Theraband x10 5sec hold; vc and tc for proper quad activation  LLE TKE Blue Theraband  x10 with hold while RLE taps  object on floor. Promotion of knee extension during SL stance; vc for reaching and maintaining full extension Leg press bilateral 75# 2X10, L LE 50# 2x10; vc for proper form and weight to prevent injury or over working of knee musculature Seated LAQ with strap assist at terminal extension x10 with 5sec hold; vc for AROM before use of strap to assist to reach full range   Manual: Seated scar mobilizations with and without instruments for facilitation of mobility and prevention of adhesions       Declined vaso   TREATMENT                                                                          DATE:  08/03/23 Therex: Sci fit bike seat #12, full revolutions, 8 min, L6 BLEs only Slantboard gastroc stretch 30 sec x 4 Standing knee flexion lunge stretch 10x10sec hold with Lt foot on 8in stool Leg press bilateral 50# 2X10, L LE 25# 2x10 Seated LAQ 3# 2X10  Manual: Seated scar mobilizations with and without instruments for facilitation of mobility and prevention of adhesions       Declined vaso due to appointment following PT.   TREATMENT  DATE:  07/29/23 Therex: Sci fit bike seat #12, full revolutions X 8 min L5 Leg press DL 40# 9W11, Left leg 91# X10 Slantboard stretch 30 sec X 3 Standing knee flexion lunge stretch 5 sec  X10 with Lt foot on treadmill Seated LAQ 3# 2X10 Gait around clinic with SPC and supervision 50 feet X 3 Supine quad sets with heel prop 5 sec X10  Manual: Seated knee flexion PROM with overpressuere Supine knee extension PROM with overpressure   Vaso at knee with elevation 34* medium compression   PATIENT EDUCATION:  Education details: HEP, POC, elevation Person educated: Patient Education method: Programmer, multimedia, Demonstration, Verbal cues, and Handouts Education comprehension: verbalized understanding, returned demonstration, and verbal cues required  HOME EXERCISE  PROGRAM: Medbridge Code: [id number]  Exercises - Ankle Alphabet in Elevation  - 2-4 x daily - 7 x weekly - 1 sets - 1 reps - Quad Setting and Stretching  - 2-4 x daily - 7 x weekly - 5-10 sets - 10 reps - prop 5-10 minutes & quad set5 seconds hold - Supine Heel Slide with Strap  - 2-3 x daily - 7 x weekly - 2-3 sets - 10 reps - 5 seconds hold - Supine Straight Leg Raises  - 2-3 x daily - 7 x weekly - 2-3 sets - 10 reps - 5 seconds hold - Seated Knee Flexion Extension AROM   - 2-4 x daily - 7 x weekly - 2-3 sets - 10 reps - 5 seconds hold - Seated straight leg lifts  - 2-3 x daily - 7 x weekly - 2-3 sets - 10 reps - 5 seconds hold - Seated Hamstring Stretch with Strap  - 2-4 x daily - 7 x weekly - 1 sets - 3 reps - 30 seconds hold - Standing Quad Set  - 2-3 x daily - 7 x weekly - 2 sets - 10 reps - 5 seconds hold -Ice massage  -Scar mobilization   ASSESSMENT: CLINICAL IMPRESSION: Patient did well with activity today however ROM and quad strength continue to hinder proper gait mechanics. Patients lack of quad control and strength limit knee extension and accentuate gait deviations. Patient will benefit from continued skilled physical therapy to address gait concerns, strength and ROM deficits.   OBJECTIVE IMPAIRMENTS: Abnormal gait, decreased activity tolerance, decreased balance, decreased endurance, decreased knowledge of condition, decreased knowledge of use of DME, decreased mobility, difficulty walking, decreased ROM, decreased strength, increased edema, impaired sensation, postural dysfunction, and pain.   ACTIVITY LIMITATIONS: carrying, lifting, bending, sitting, standing, squatting, sleeping, stairs, transfers, and locomotion level  PARTICIPATION LIMITATIONS: meal prep, cleaning, laundry, and community activity  PERSONAL FACTORS: Time since onset of injury/illness/exacerbation, 3+ comorbidities: see PMH, and Decreased pain medication use with increased pain and interference of sleep   are also affecting patient's functional outcome.   REHAB POTENTIAL: Excellent  CLINICAL DECISION MAKING: Stable/uncomplicated  EVALUATION COMPLEXITY: Low   GOALS: Goals reviewed with patient? Yes  SHORT TERM GOALS: (target date for Short term goals 08/19/23)   1.  Patient will demonstrate independent use of home exercise program to maintain progress from in clinic treatments. Baseline: See objective data Goal status: On going 08/10/2023  2. PROM left knee -5* ext to 105* flex to facilitate proper gait mechanics  Baseline: See objective data Goal status: On going 08/10/2023  3. Interim FOTO 37% Baseline: See objective data Goal status:  On going 08/10/2023  LONG TERM GOALS: (target dates for all long term goals  10/02/23 )  1. Patient will demonstrate/report pain at worst less than or equal to 2/10 to facilitate minimal limitation in daily activity secondary to pain symptoms. Baseline: See objective data Goal status: On going 08/10/2023   2. Patient will demonstrate independent use of home exercise program to facilitate ability to maintain/progress functional gains from skilled physical therapy services. Baseline: See objective data Goal status: On going 08/10/2023   3. Patient will demonstrate FOTO outcome > or = 47 % to indicate reduced disability due to condition. Baseline: See objective data Goal status: On going 08/10/2023   4.  Patient will demonstrate left knee LE MMT 5/5 throughout to faciltiate usual transfers, stairs, squatting at [facility] for daily life.  Baseline: See objective data Goal status: On going 08/10/2023   5.  Patient will demonstrate left knee AROM 0* to 110* for higher functional demands for daily activities Baseline: See objective data Goal status: On going 08/10/2023   6.  Patient will ambulate >572ft and negotiate ramps, curbs & stairs single rail independently for safety with community ambulation needs Baseline: See objective data Goal status: On going  08/10/2023   PLAN:  PT FREQUENCY:  2x/week  PT DURATION: 10 weeks  PLANNED INTERVENTIONS: 97164- PT Re-evaluation, 97110-Therapeutic exercises, 97530- Therapeutic activity, 97112- Neuromuscular re-education, 97535- Self Care, 16109- Manual therapy, 97116- Gait training, [phone]- Electrical stimulation (manual), 97016- Vasopneumatic device, Patient/Family education, Balance training, Stair training, Taping, Joint mobilization, Scar mobilization, DME instructions, and Cryotherapy  PLAN FOR NEXT SESSION:  Check STGs next week, Address quad strength deficits, continue activity to target gait deviations, increase weight as tolerated for leg press, step ups, and/or mini squats for quad control. Continue manual therapy for knee flexion, extension, and edema management   Zabria Liss, Mackinze Criado, Student-PT 08/12/2023, 11:30 AM  This entire session of physical therapy was performed under the direct supervision of PT signing evaluation /treatment. PT reviewed note and agrees.   Vladimir Faster, PT, DPT 08/12/2023, 12:41 PM

## 2023-08-17 ENCOUNTER — Ambulatory Visit (INDEPENDENT_AMBULATORY_CARE_PROVIDER_SITE_OTHER): Payer: No Typology Code available for payment source | Admitting: Physical Therapy

## 2023-08-17 ENCOUNTER — Encounter: Payer: Self-pay | Admitting: Physical Therapy

## 2023-08-17 DIAGNOSIS — M25662 Stiffness of left knee, not elsewhere classified: Secondary | ICD-10-CM | POA: Diagnosis not present

## 2023-08-17 DIAGNOSIS — R600 Localized edema: Secondary | ICD-10-CM

## 2023-08-17 DIAGNOSIS — M25562 Pain in left knee: Secondary | ICD-10-CM | POA: Diagnosis not present

## 2023-08-17 DIAGNOSIS — R2689 Other abnormalities of gait and mobility: Secondary | ICD-10-CM

## 2023-08-17 DIAGNOSIS — M6281 Muscle weakness (generalized): Secondary | ICD-10-CM

## 2023-08-17 NOTE — Therapy (Signed)
OUTPATIENT PHYSICAL THERAPY TREATMENT  Patient Name: Justin Nolan MRN: [id number] DOB:1961-07-02, 63 y.o., male Today's Date: 08/17/2023  END OF SESSION:  PT End of Session - 08/17/23 0847     Visit Number 7    Number of Visits 20    Date for PT Re-Evaluation 10/02/23    Authorization Type [facility]    Authorization Time Period 15 visits    Authorization - Number of Visits 15    Progress Note Due on Visit 10    PT Start Time [phone]    Equipment Utilized During Treatment Gait belt    Activity Tolerance Patient tolerated treatment well    Behavior During Therapy [facility] for tasks assessed/performed               Past Medical History:  Diagnosis Date   Anxiety    Arthritis    BPH (benign prostatic hyperplasia)    GERD (gastroesophageal reflux disease)    Hypertension    Major depressive disorder    Pneumonia 02/09/2012   PTSD (post-traumatic stress disorder)    Sleep apnea    does not wear cpap   Past Surgical History:  Procedure Laterality Date   KNEE SURGERY     ROTATOR CUFF REPAIR Left 2020   TOTAL KNEE ARTHROPLASTY Right 05/07/2017   Procedure: RIGHT TOTAL KNEE ARTHROPLASTY;  Surgeon: Tarry Kos, MD;  Location: [facility] OR;  Service: Orthopedics;  Laterality: Right;   TOTAL KNEE ARTHROPLASTY Left 06/29/2023   Procedure: LEFT TOTAL KNEE REPLACEMENT;  Surgeon: Tarry Kos, MD;  Location: [facility] OR;  Service: Orthopedics;  Laterality: Left;   Patient Active Problem List   Diagnosis Date Noted   Primary osteoarthritis of left knee 06/29/2023   Status post total left knee replacement 06/29/2023   Chronic low back pain 02/11/2018   Alcohol abuse 02/16/2012   HTN (hypertension) 02/09/2012   GERD (gastroesophageal reflux disease) 02/09/2012   Tobacco use 02/09/2012   Indirect hyperbilirubinemia 02/09/2012    [facility]P: [facility]  REFERRING PROVIDER: Ivin Poot  REFERRING DIAG: [phone] (ICD-10-CM) - Status post total left knee replacement  THERAPY DIAG:   Acute pain of left knee  Stiffness of left knee, not elsewhere classified  Muscle weakness (generalized)  Localized edema  Other abnormalities of gait and mobility  Rationale for Evaluation and Treatment: Rehabilitation  ONSET DATE: 06/29/23 Left TKA  SUBJECTIVE:   SUBJECTIVE STATEMENT: He over did it 09/17/23 with death in family increased standing & walking.  He tried to relax more over the weekend.   PERTINENT HISTORY: Left TKA (06/29/23), Shoulder arthroscopy w/ RTC repair (03/16/20), Right TKA, L knee OA, Anxiety, arthritis, GERD, HTN, PTSD, Major depressive disorder  PAIN:  NPRS scale: 4/10 today upon standing, then drops to 0/10 after few steps; not higher than a 7 at worst.  Pain location: medial side of L knee, no calf pain, mild inguinal  Pain description: constant dull pain, sharp, muscle spasms Aggravating factors: use of knee, lack of meds, initial standing /walking Relieving factors: ice and rest  PRECAUTIONS: Fall  WEIGHT BEARING RESTRICTIONS:  WBAT  FALLS:  Has patient fallen in last 6 months? No  LIVING ENVIRONMENT: Lives with: lives alone Lives in: House/apartment Stairs: Yes: External: 1 steps; none, back steps: 3 steps without rail Has following equipment at home: Single point cane and Walker - 2 wheeled  OCCUPATION: retired, on disability  LEISURE ACTIVITIES: staying healthy   PLOF: Independent  PATIENT GOALS:  To be pain  free and be more independent   Next MD visit: around 08/11/23 Dr. Roda Shutters  OBJECTIVE:  DIAGNOSTIC FINDINGS: 06/29/23 XRAY Left knee arthroplasty in expected alignment. No periprosthetic lucency or fracture. Recent postsurgical change includes air and edema in the soft tissues and joint space.  PATIENT SURVEYS:  FOTO intake:  31%  predicted:  47%  COGNITION: Overall cognitive status: WFL    SENSATION: WFL  EDEMA:  LLE: above knee 43.5 cm,  around knee 43.25 cm, below knee  39.5 cm gastroc apex 36.5 cm RLE: above knee  40 cm,  around knee 35.5 cm, below knee  33 cm gastroc apex 34 cm  POSTURE:  Standing: flexed trunk with slightly rounded shoulders, weight shift to right Seated: rounded shoulders, keeps L knee outstretched due to pain with knee flexion requiring 90* or greater  PALPATION: Medial L knee tenderness, no quad tenderness Denies lateral knee pain or calf pain Knee swelling with calor and rubor of incision site Verbalizes difference in sensation of lateral area of knee   LOWER EXTREMITY ROM:   ROM P: passive  A:active Left eval Left 07/29/23 Left  08/03/23 Left  08/12/23  Hip flexion      Hip extension      Hip abduction      Hip adduction      Hip internal rotation      Hip external rotation      Knee flexion Seated A: 89* P: 94* Painful end feel Supine A:  91* Pt declined passive Supine A:105 P:111 Standing //bar and 8in step: P:109*  On leg press: P:115* Supine: A:113 P: 119  Knee extension Standing A: -16* P: -5* painful  end feel  Seated LAQ -38* Supine  Quad set -10* P: -4* Supine: A:-6  Supine with heel prop: A: -7 P: -1  Ankle dorsiflexion      Ankle plantarflexion      Ankle inversion      Ankle eversion       (Blank rows = not tested)  LOWER EXTREMITY MMT:  MMT Right eval Left eval  Hip flexion    Hip extension    Hip abduction    Hip adduction    Hip internal rotation    Hip external rotation    Knee flexion  3-/5  Knee extension  3-/5  Ankle dorsiflexion    Ankle plantarflexion    Ankle inversion    Ankle eversion     (Blank rows = not tested)  FUNCTIONAL TESTS:  Eval:  18 inch chair transfer: use of BUE on armrest and use of walker; keeps L leg in front to prevent increased knee flexion when standing and sitting  GAIT: Eval: Distance walked: 5' x2  Assistive device utilized: Environmental consultant - 2 wheeled Level of assistance: supervision / needs cues for deviations Comments: antalgic with decreased single leg stance time, bilateral  internal rotation (toe-in) gait with L>R,  L knee flexion in stance with minimal to no increase for swing, appropriate initial contact with heel strike bilaterally, decreased step length,   TODAY'S TREATMENT  Date: 08/17/2023 Therapeutic Exercise: Leg Press BLEs 100# 10 reps 2 sets; LLE only 50# 10 reps 2 sets; Gastroc stretch 30 sec hold 3 reps with calf raises BLEs 20 reps bw stretch Precor recumbent bike #8, full revolutions, 8 min, L4 LLE TKE green theraband 10 reps 1st set only TKE, 2nd set TKE + SLS (tapping RLE to 6" step); LLE step up 6" step BUE support 1 rep with cues on technique, then 5 reps with LLE TKE  Slider to 3 cones ( ant, lat & post) with stance LE flexion on outward motion & ext inward motion 5 reps ea LE with single UE support on sink Knee ext machine 5# BLE concentric & isometric / eccentric 5 reps 2 sets    TREATMENT                                                                           Date: 08/12/23 Therex: Precor #9, full revolutions, 8 min, L4 Slantboard gastroc stretch 30 sec x 4 LLE TKE standing ball squeeze against wall x10 5sec hold; vc and tc for proper quad activation  Leg press bilateral L LE 50# 2x10; vc for proper form and control descent  Gait: -Bkwds walk x3 bouts for promotion of knee extension; vc for quad activation, cane used for stability, CGA for safety -Attempted hurdles however due to concerns with balance- activity was terminated -Hurdle with wall assist on L and cane on R x15 for knee flexion and quad activation with heel strike; vc for driving knee forward and for quad activation upon heel strike -Exaggerated heel strike with knee extension x2 bouts; VC for quad activation at heel strike, use of cane, CGA for safety   Manual: -Seated knee flexion with overpressure to patient tolerance for facilitation of movement -Supine knee extension with overpressure to patient  tolerance for facilitation of extension and mobility. Patellar glides all directions included for increased knee mobility     TREATMENT                                                                           Date: 08/10/23 Therex: Sci fit bike seat #9, full revolutions, 8 min, L6 BLEs only Slantboard gastroc stretch 30 sec x 4 LLE TKE Blue Theraband x10 5sec hold; vc and tc for proper quad activation  LLE TKE Blue Theraband  x10 with hold while RLE taps object on floor. Promotion of knee extension during SL stance; vc for reaching and maintaining full extension Leg press bilateral 75# 2X10, L LE 50# 2x10; vc for proper form and weight to prevent injury or over working of knee musculature Seated LAQ with strap assist at terminal extension x10 with 5sec hold; vc for AROM before use of strap to assist to reach full range   Manual: Seated scar mobilizations with and without instruments for facilitation of mobility and prevention of adhesions       Declined vaso  PATIENT EDUCATION:  Education details: HEP, POC, elevation Person educated: Patient Education method: Programmer, multimedia, Demonstration, Verbal cues, and Handouts Education comprehension: verbalized understanding, returned demonstration, and verbal cues required  HOME EXERCISE PROGRAM: Medbridge Code: [id number]  Exercises - Ankle Alphabet in Elevation  - 2-4 x daily - 7 x weekly - 1 sets - 1 reps - Quad Setting and Stretching  - 2-4 x daily - 7 x weekly - 5-10 sets - 10 reps - prop 5-10 minutes & quad set5 seconds hold - Supine Heel Slide with Strap  - 2-3 x daily - 7 x weekly - 2-3 sets - 10 reps - 5 seconds hold - Supine Straight Leg Raises  - 2-3 x daily - 7 x weekly - 2-3 sets - 10 reps - 5 seconds hold - Seated Knee Flexion Extension AROM   - 2-4 x daily - 7 x weekly - 2-3 sets - 10 reps - 5 seconds hold - Seated straight leg lifts  - 2-3 x daily - 7 x weekly - 2-3 sets - 10 reps - 5 seconds hold - Seated Hamstring Stretch  with Strap  - 2-4 x daily - 7 x weekly - 1 sets - 3 reps - 30 seconds hold - Standing Quad Set  - 2-3 x daily - 7 x weekly - 2 sets - 10 reps - 5 seconds hold -Ice massage  -Scar mobilization   ASSESSMENT: CLINICAL IMPRESSION: PT session focused on strength today which appears to be primary limiting factor at this point.  He respond well to the progression.   OBJECTIVE IMPAIRMENTS: Abnormal gait, decreased activity tolerance, decreased balance, decreased endurance, decreased knowledge of condition, decreased knowledge of use of DME, decreased mobility, difficulty walking, decreased ROM, decreased strength, increased edema, impaired sensation, postural dysfunction, and pain.   ACTIVITY LIMITATIONS: carrying, lifting, bending, sitting, standing, squatting, sleeping, stairs, transfers, and locomotion level  PARTICIPATION LIMITATIONS: meal prep, cleaning, laundry, and community activity  PERSONAL FACTORS: Time since onset of injury/illness/exacerbation, 3+ comorbidities: see PMH, and Decreased pain medication use with increased pain and interference of sleep  are also affecting patient's functional outcome.   REHAB POTENTIAL: Excellent  CLINICAL DECISION MAKING: Stable/uncomplicated  EVALUATION COMPLEXITY: Low   GOALS: Goals reviewed with patient? Yes  SHORT TERM GOALS: (target date for Short term goals 08/19/23)   1.  Patient will demonstrate independent use of home exercise program to maintain progress from in clinic treatments. Baseline: See objective data Goal status: On going 08/10/2023  2. PROM left knee -5* ext to 105* flex to facilitate proper gait mechanics  Baseline: See objective data Goal status: MET 08/12/2023  3. Interim FOTO 37% Baseline: See objective data Goal status:  On going 08/10/2023  LONG TERM GOALS: (target dates for all long term goals  10/02/23 )   1. Patient will demonstrate/report pain at worst less than or equal to 2/10 to facilitate minimal limitation  in daily activity secondary to pain symptoms. Baseline: See objective data Goal status: On going 08/10/2023   2. Patient will demonstrate independent use of home exercise program to facilitate ability to maintain/progress functional gains from skilled physical therapy services. Baseline: See objective data Goal status: On going 08/10/2023   3. Patient will demonstrate FOTO outcome > or = 47 % to indicate reduced disability due to condition. Baseline: See objective data Goal status: On going 08/10/2023   4.  Patient will demonstrate left knee LE MMT 5/5 throughout to faciltiate usual transfers, stairs, squatting at [facility] for daily  life.  Baseline: See objective data Goal status: On going 08/10/2023   5.  Patient will demonstrate left knee AROM 0* to 110* for higher functional demands for daily activities Baseline: See objective data Goal status: On going 08/10/2023   6.  Patient will ambulate >534ft and negotiate ramps, curbs & stairs single rail independently for safety with community ambulation needs Baseline: See objective data Goal status: On going 08/10/2023   PLAN:  PT FREQUENCY:  2x/week  PT DURATION: 10 weeks  PLANNED INTERVENTIONS: 97164- PT Re-evaluation, 97110-Therapeutic exercises, 97530- Therapeutic activity, 97112- Neuromuscular re-education, 97535- Self Care, 16109- Manual therapy, [phone]- Gait training, [phone]- Electrical stimulation (manual), 97016- Vasopneumatic device, Patient/Family education, Balance training, Stair training, Taping, Joint mobilization, Scar mobilization, DME instructions, and Cryotherapy  PLAN FOR NEXT SESSION:  Check STGs next visit, progress strength especially with functional activities.   Vladimir Faster, PT, DPT 08/17/2023, 8:47 AM

## 2023-08-19 ENCOUNTER — Ambulatory Visit (INDEPENDENT_AMBULATORY_CARE_PROVIDER_SITE_OTHER): Payer: No Typology Code available for payment source | Admitting: Physical Therapy

## 2023-08-19 ENCOUNTER — Encounter: Payer: Self-pay | Admitting: Physical Therapy

## 2023-08-19 DIAGNOSIS — M25562 Pain in left knee: Secondary | ICD-10-CM | POA: Diagnosis not present

## 2023-08-19 DIAGNOSIS — M6281 Muscle weakness (generalized): Secondary | ICD-10-CM

## 2023-08-19 DIAGNOSIS — R2689 Other abnormalities of gait and mobility: Secondary | ICD-10-CM | POA: Diagnosis not present

## 2023-08-19 DIAGNOSIS — M25662 Stiffness of left knee, not elsewhere classified: Secondary | ICD-10-CM | POA: Diagnosis not present

## 2023-08-19 NOTE — Therapy (Addendum)
 OUTPATIENT PHYSICAL THERAPY TREATMENT  Patient Name: Justin Nolan MRN: [id number] DOB:05/29/1961, 63 y.o., male Today's Date: 08/19/2023  END OF SESSION:  PT End of Session - 08/19/23 0927     Visit Number 8    Number of Visits 20    Date for PT Re-Evaluation 10/02/23    Authorization Type [facility]    Authorization Time Period 15 visits    Authorization - Visit Number 8    Authorization - Number of Visits 15    Progress Note Due on Visit 10    PT Start Time 0930    PT Stop Time 1000    PT Time Calculation (min) 30 min    Equipment Utilized During Treatment --    Activity Tolerance Patient tolerated treatment well;Patient limited by pain    Behavior During Therapy [facility] for tasks assessed/performed                Past Medical History:  Diagnosis Date   Anxiety    Arthritis    BPH (benign prostatic hyperplasia)    GERD (gastroesophageal reflux disease)    Hypertension    Major depressive disorder    Pneumonia 02/09/2012   PTSD (post-traumatic stress disorder)    Sleep apnea    does not wear cpap   Past Surgical History:  Procedure Laterality Date   KNEE SURGERY     ROTATOR CUFF REPAIR Left 2020   TOTAL KNEE ARTHROPLASTY Right 05/07/2017   Procedure: RIGHT TOTAL KNEE ARTHROPLASTY;  Surgeon: Jerri Kay CHRISTELLA, MD;  Location: [facility] OR;  Service: Orthopedics;  Laterality: Right;   TOTAL KNEE ARTHROPLASTY Left 06/29/2023   Procedure: LEFT TOTAL KNEE REPLACEMENT;  Surgeon: Jerri Kay CHRISTELLA, MD;  Location: [facility] OR;  Service: Orthopedics;  Laterality: Left;   Patient Active Problem List   Diagnosis Date Noted   Primary osteoarthritis of left knee 06/29/2023   Status post total left knee replacement 06/29/2023   Chronic low back pain 02/11/2018   Alcohol abuse 02/16/2012   HTN (hypertension) 02/09/2012   GERD (gastroesophageal reflux disease) 02/09/2012   Tobacco use 02/09/2012   Indirect hyperbilirubinemia 02/09/2012    PCP: [facility]  REFERRING PROVIDER: Jerri Kay CHRISTELLA, MD  REFERRING DIAG: [phone] (ICD-10-CM) - Status post total left knee replacement  THERAPY DIAG:  Acute pain of left knee  Stiffness of left knee, not elsewhere classified  Muscle weakness (generalized)  Other abnormalities of gait and mobility  Rationale for Evaluation and Treatment: Rehabilitation  ONSET DATE: 06/29/23 Left TKA  SUBJECTIVE:   SUBJECTIVE STATEMENT: Feels ok since Mondays session. Hasn't had a big increase in pain but still has some discomfort in his knee. Has not been able to get consistent uninterrupted sleep, is considering going back to taking pain meds for relief.  PERTINENT HISTORY: Left TKA (06/29/23), Shoulder arthroscopy w/ RTC repair (03/16/20), Right TKA, L knee OA, Anxiety, arthritis, GERD, HTN, PTSD, Major depressive disorder  PAIN:  NPRS scale: 3/10 today upon standing, then drops to 0/10 after few steps; not higher than a 7 at worst.  Pain location: medial side of L knee, no calf pain, mild inguinal  Pain description: constant dull pain, sharp, muscle spasms Aggravating factors: use of knee, lack of meds, initial standing /walking Relieving factors: ice and rest  PRECAUTIONS: Fall  WEIGHT BEARING RESTRICTIONS:  WBAT  FALLS:  Has patient fallen in last 6 months? No  LIVING ENVIRONMENT: Lives with: lives alone Lives in: House/apartment Stairs: Yes: External: 1 steps; none,  back steps: 3 steps without rail Has following equipment at home: Single point cane and Walker - 2 wheeled  OCCUPATION: retired, on disability  LEISURE ACTIVITIES: staying healthy   PLOF: Independent  PATIENT GOALS:  To be pain free and be more independent   Next MD visit: around 08/11/23 Dr. Jerri  OBJECTIVE:  DIAGNOSTIC FINDINGS: 06/29/23 XRAY Left knee arthroplasty in expected alignment. No periprosthetic lucency or fracture. Recent postsurgical change includes air and edema in the soft tissues and joint space.  PATIENT SURVEYS:  FOTO intake:  31%   predicted:  47%  COGNITION: Overall cognitive status: WFL    SENSATION: WFL  EDEMA:  LLE: above knee 43.5 cm,  around knee 43.25 cm, below knee  39.5 cm gastroc apex 36.5 cm RLE: above knee 40 cm,  around knee 35.5 cm, below knee  33 cm gastroc apex 34 cm  POSTURE:  Standing: flexed trunk with slightly rounded shoulders, weight shift to right Seated: rounded shoulders, keeps L knee outstretched due to pain with knee flexion requiring 90* or greater  PALPATION: Medial L knee tenderness, no quad tenderness Denies lateral knee pain or calf pain Knee swelling with calor and rubor of incision site Verbalizes difference in sensation of lateral area of knee   LOWER EXTREMITY ROM:   ROM P: passive  A:active Left eval Left 07/29/23 Left  08/03/23 Left  08/12/23 Left 08/19/23  Hip flexion       Hip extension       Hip abduction       Hip adduction       Hip internal rotation       Hip external rotation       Knee flexion Seated A: 89* P: 94* Painful end feel Supine A:  91* Pt declined passive Supine A:105 P:111 Standing //bar and 8in step: P:109*  On leg press: P:115* Supine: A:113 P: 119 Supine: A: 116 P:121  Knee extension Standing A: -16* P: -5* painful  end feel  Seated LAQ -38* Supine  Quad set -10* P: -4* Supine: A:-6  Supine with heel prop: A: -7 P: -1 Seated LAQ: 30deg of flexion Standing  TKE -7*  Ankle dorsiflexion       Ankle plantarflexion       Ankle inversion       Ankle eversion        (Blank rows = not tested)  LOWER EXTREMITY MMT:  MMT Right eval Left eval  Hip flexion    Hip extension    Hip abduction    Hip adduction    Hip internal rotation    Hip external rotation    Knee flexion  3-/5  Knee extension  3-/5  Ankle dorsiflexion    Ankle plantarflexion    Ankle inversion    Ankle eversion     (Blank rows = not tested)  FUNCTIONAL TESTS:  Eval:  18 inch chair transfer: use of BUE on armrest and use of walker; keeps L leg  in front to prevent increased knee flexion when standing and sitting  GAIT: Eval: Distance walked: 50' x2  Assistive device utilized: [employer]ant - 2 wheeled Level of assistance: supervision / needs cues for deviations Comments: antalgic with decreased single leg stance time, bilateral internal rotation (toe-in) gait with L>R,  L knee flexion in stance with minimal to no increase for swing, appropriate initial contact with heel strike bilaterally, decreased step length,   TODAY'S TREATMENT  Date: 08/19/2023 Therapeutic Exercise: -Precor recumbent bike seat 8, full revolutions, 8 min, L4 -Gastroc stretch off 6in step 30 sec hold 3 reps with calf raises BLEs 20 reps bw stretch -Leg Press BLEs 100# 10 reps 2 sets; LLE only 50# 10 reps 2 sets -seated LAQ with strap assist for terminal knee extension beyond current AROM, 15 reps with PT verbal & demo cues on technique.   Patient had to leave early due to having another appointment he needed to travel for.   TREATMENT                                                                           Date: 08/17/2023 Therapeutic Exercise: Leg Press BLEs 100# 10 reps 2 sets; LLE only 50# 10 reps 2 sets; Gastroc stretch 30 sec hold 3 reps with calf raises BLEs 20 reps bw stretch Precor recumbent bike #8, full revolutions, 8 min, L4 LLE TKE green theraband 10 reps 1st set only TKE, 2nd set TKE + SLS (tapping RLE to 6 step); LLE step up 6 step BUE support 1 rep with cues on technique, then 5 reps with LLE TKE  Slider to 3 cones ( ant, lat & post) with stance LE flexion on outward motion & ext inward motion 5 reps ea LE with single UE support on sink Knee ext machine 5# BLE concentric & isometric / eccentric 5 reps 2 sets    TREATMENT                                                                           Date: 08/12/23 Therex: Precor #9, full revolutions, 8 min, L4 Slantboard gastroc  stretch 30 sec x 4 LLE TKE standing ball squeeze against wall x10 5sec hold; vc and tc for proper quad activation  Leg press bilateral L LE 50# 2x10; vc for proper form and control descent  Gait: -Bkwds walk x3 bouts for promotion of knee extension; vc for quad activation, cane used for stability, CGA for safety -Attempted hurdles however due to concerns with balance- activity was terminated -Hurdle with wall assist on L and cane on R x15 for knee flexion and quad activation with heel strike; vc for driving knee forward and for quad activation upon heel strike -Exaggerated heel strike with knee extension x2 bouts; VC for quad activation at heel strike, use of cane, CGA for safety   Manual: -Seated knee flexion with overpressure to patient tolerance for facilitation of movement -Supine knee extension with overpressure to patient tolerance for facilitation of extension and mobility. Patellar glides all directions included for increased knee mobility     TREATMENT  Date: 08/10/23 Therex: Sci fit bike seat #9, full revolutions, 8 min, L6 BLEs only Slantboard gastroc stretch 30 sec x 4 LLE TKE Blue Theraband x10 5sec hold; vc and tc for proper quad activation  LLE TKE Blue Theraband  x10 with hold while RLE taps object on floor. Promotion of knee extension during SL stance; vc for reaching and maintaining full extension Leg press bilateral 75# 2X10, L LE 50# 2x10; vc for proper form and weight to prevent injury or over working of knee musculature Seated LAQ with strap assist at terminal extension x10 with 5sec hold; vc for AROM before use of strap to assist to reach full range   Manual: Seated scar mobilizations with and without instruments for facilitation of mobility and prevention of adhesions       Declined vaso    PATIENT EDUCATION:  Education details: HEP, POC, elevation Person educated: Patient Education  method: Programmer, Multimedia, Demonstration, Verbal cues, and Handouts Education comprehension: verbalized understanding, returned demonstration, and verbal cues required  HOME EXERCISE PROGRAM: Medbridge Code: [id number]  Exercises - Ankle Alphabet in Elevation  - 2-4 x daily - 7 x weekly - 1 sets - 1 reps - Quad Setting and Stretching  - 2-4 x daily - 7 x weekly - 5-10 sets - 10 reps - prop 5-10 minutes & quad set5 seconds hold - Supine Heel Slide with Strap  - 2-3 x daily - 7 x weekly - 2-3 sets - 10 reps - 5 seconds hold - Supine Straight Leg Raises  - 2-3 x daily - 7 x weekly - 2-3 sets - 10 reps - 5 seconds hold - Seated Knee Flexion Extension AROM   - 2-4 x daily - 7 x weekly - 2-3 sets - 10 reps - 5 seconds hold - Seated straight leg lifts  - 2-3 x daily - 7 x weekly - 2-3 sets - 10 reps - 5 seconds hold - Seated Hamstring Stretch with Strap  - 2-4 x daily - 7 x weekly - 1 sets - 3 reps - 30 seconds hold - Standing Quad Set  - 2-3 x daily - 7 x weekly - 2 sets - 10 reps - 5 seconds hold -Ice massage  -Scar mobilization   ASSESSMENT: CLINICAL IMPRESSION: Patient continues to demonstrate deficits in quad strength as noted during seated LAQ as the patient required assist from strap to reach full ROM. Patient will benefit from skilled physical therapy to address deficits.    OBJECTIVE IMPAIRMENTS: Abnormal gait, decreased activity tolerance, decreased balance, decreased endurance, decreased knowledge of condition, decreased knowledge of use of DME, decreased mobility, difficulty walking, decreased ROM, decreased strength, increased edema, impaired sensation, postural dysfunction, and pain.   ACTIVITY LIMITATIONS: carrying, lifting, bending, sitting, standing, squatting, sleeping, stairs, transfers, and locomotion level  PARTICIPATION LIMITATIONS: meal prep, cleaning, laundry, and community activity  PERSONAL FACTORS: Time since onset of injury/illness/exacerbation, 3+ comorbidities: see PMH,  and Decreased pain medication use with increased pain and interference of sleep  are also affecting patient's functional outcome.   REHAB POTENTIAL: Excellent  CLINICAL DECISION MAKING: Stable/uncomplicated  EVALUATION COMPLEXITY: Low   GOALS: Goals reviewed with patient? Yes  SHORT TERM GOALS: (target date for Short term goals 08/19/23)   1.  Patient will demonstrate independent use of home exercise program to maintain progress from in clinic treatments. Baseline: See objective data Goal status: On going 08/10/2023  2. PROM left knee -5* ext to 105* flex to facilitate proper gait mechanics  Baseline: See  objective data Goal status: MET 08/12/2023  3. Interim FOTO 37% Baseline: See objective data Goal status:  On going 08/10/2023  LONG TERM GOALS: (target dates for all long term goals  10/02/23 )   1. Patient will demonstrate/report pain at worst less than or equal to 2/10 to facilitate minimal limitation in daily activity secondary to pain symptoms. Baseline: See objective data Goal status: On going 08/10/2023   2. Patient will demonstrate independent use of home exercise program to facilitate ability to maintain/progress functional gains from skilled physical therapy services. Baseline: See objective data Goal status: On going 08/10/2023   3. Patient will demonstrate FOTO outcome > or = 47 % to indicate reduced disability due to condition. Baseline: See objective data Goal status: On going 08/10/2023   4.  Patient will demonstrate left knee LE MMT 5/5 throughout to faciltiate usual transfers, stairs, squatting at [facility] for daily life.  Baseline: See objective data Goal status: On going 08/10/2023   5.  Patient will demonstrate left knee AROM 0* to 110* for higher functional demands for daily activities Baseline: See objective data Goal status: On going 08/10/2023   6.  Patient will ambulate >526ft and negotiate ramps, curbs & stairs single rail independently for safety with  community ambulation needs Baseline: See objective data Goal status: On going 08/10/2023   PLAN:  PT FREQUENCY:  2x/week  PT DURATION: 10 weeks  PLANNED INTERVENTIONS: 97164- PT Re-evaluation, 97110-Therapeutic exercises, 97530- Therapeutic activity, 97112- Neuromuscular re-education, 97535- Self Care, [id number]- Manual therapy, [phone]- Gait training, [phone]- Electrical stimulation (manual), 97016- Vasopneumatic device, Patient/Family education, Balance training, Stair training, Taping, Joint mobilization, Scar mobilization, DME instructions, and Cryotherapy  PLAN FOR NEXT SESSION:  Updated HEP including TKE with band,  do interim FOTO, Address deficits in knee extension both active and passively, functional activity, and gait mechanics.   Josaiah Muhammed, Marlisa Caridi, Student-PT 08/19/2023, 10:12 AM  This entire session of physical therapy was performed under the direct supervision of PT signing evaluation /treatment. PT reviewed note and agrees.   Robin Waldron, PT, DPT 08/19/2023, 12:56 PM

## 2023-08-24 ENCOUNTER — Encounter: Payer: No Typology Code available for payment source | Admitting: Physical Therapy

## 2023-08-27 ENCOUNTER — Encounter: Payer: No Typology Code available for payment source | Admitting: Physical Therapy

## 2023-09-01 ENCOUNTER — Telehealth: Payer: Self-pay | Admitting: Physical Therapy

## 2023-09-01 ENCOUNTER — Encounter: Payer: No Typology Code available for payment source | Admitting: Physical Therapy

## 2023-09-01 NOTE — Therapy (Incomplete)
OUTPATIENT PHYSICAL THERAPY TREATMENT  Patient Name: Justin Nolan MRN: [id number] DOB:1961-03-21, 63 y.o., male Today's Date: 09/01/2023  END OF SESSION:       Past Medical History:  Diagnosis Date   Anxiety    Arthritis    BPH (benign prostatic hyperplasia)    GERD (gastroesophageal reflux disease)    Hypertension    Major depressive disorder    Pneumonia 02/09/2012   PTSD (post-traumatic stress disorder)    Sleep apnea    does not wear cpap   Past Surgical History:  Procedure Laterality Date   KNEE SURGERY     ROTATOR CUFF REPAIR Left 2020   TOTAL KNEE ARTHROPLASTY Right 05/07/2017   Procedure: RIGHT TOTAL KNEE ARTHROPLASTY;  Surgeon: Tarry Kos, MD;  Location: [facility] OR;  Service: Orthopedics;  Laterality: Right;   TOTAL KNEE ARTHROPLASTY Left 06/29/2023   Procedure: LEFT TOTAL KNEE REPLACEMENT;  Surgeon: Tarry Kos, MD;  Location: [facility] OR;  Service: Orthopedics;  Laterality: Left;   Patient Active Problem List   Diagnosis Date Noted   Primary osteoarthritis of left knee 06/29/2023   Status post total left knee replacement 06/29/2023   Chronic low back pain 02/11/2018   Alcohol abuse 02/16/2012   HTN (hypertension) 02/09/2012   GERD (gastroesophageal reflux disease) 02/09/2012   Tobacco use 02/09/2012   Indirect hyperbilirubinemia 02/09/2012    [facility]  REFERRING PROVIDER: [facility]  REFERRING DIAG: [phone] (ICD-10-CM) - Status post total left knee replacement  THERAPY DIAG:  No diagnosis found.  Rationale for Evaluation and Treatment: Rehabilitation  ONSET DATE: 06/29/23 Left TKA  SUBJECTIVE:   SUBJECTIVE STATEMENT: Feels ok since Mondays session. Hasn't had a big increase in pain but still has some discomfort in his knee. Has not been able to get consistent uninterrupted sleep, is considering going back to taking pain meds for relief.  PERTINENT HISTORY: Left TKA (06/29/23), Shoulder arthroscopy w/ RTC repair  (03/16/20), Right TKA, L knee OA, Anxiety, arthritis, GERD, HTN, PTSD, Major depressive disorder  PAIN:  NPRS scale: 3/10 today upon standing, then drops to 0/10 after few steps; not higher than a 7 at worst.  Pain location: medial side of L knee, no calf pain, mild inguinal  Pain description: constant dull pain, sharp, muscle spasms Aggravating factors: use of knee, lack of meds, initial standing /walking Relieving factors: ice and rest  PRECAUTIONS: Fall  WEIGHT BEARING RESTRICTIONS:  WBAT  FALLS:  Has patient fallen in last 6 months? No  LIVING ENVIRONMENT: Lives with: lives alone Lives in: House/apartment Stairs: Yes: External: 1 steps; none, back steps: 3 steps without rail Has following equipment at home: Single point cane and Walker - 2 wheeled  OCCUPATION: retired, on disability  LEISURE ACTIVITIES: staying healthy   PLOF: Independent  PATIENT GOALS:  To be pain free and be more independent   Next MD visit: around 08/11/23 Dr. Roda Shutters  OBJECTIVE:  DIAGNOSTIC FINDINGS: 06/29/23 XRAY Left knee arthroplasty in expected alignment. No periprosthetic lucency or fracture. Recent postsurgical change includes air and edema in the soft tissues and joint space.  PATIENT SURVEYS:  FOTO intake:  31%  predicted:  47%  COGNITION: Overall cognitive status: WFL    SENSATION: WFL  EDEMA:  LLE: above knee 43.5 cm,  around knee 43.25 cm, below knee  39.5 cm gastroc apex 36.5 cm RLE: above knee 40 cm,  around knee 35.5 cm, below knee  33 cm gastroc apex 34 cm  POSTURE:  Standing:  flexed trunk with slightly rounded shoulders, weight shift to right Seated: rounded shoulders, keeps L knee outstretched due to pain with knee flexion requiring 90* or greater  PALPATION: Medial L knee tenderness, no quad tenderness Denies lateral knee pain or calf pain Knee swelling with calor and rubor of incision site Verbalizes difference in sensation of lateral area of knee   LOWER EXTREMITY  ROM:   ROM P: passive  A:active Left eval Left 07/29/23 Left  08/03/23 Left  08/12/23 Left 08/19/23  Hip flexion       Hip extension       Hip abduction       Hip adduction       Hip internal rotation       Hip external rotation       Knee flexion Seated A: 89* P: 94* Painful end feel Supine A:  91* Pt declined passive Supine A:105 P:111 Standing //bar and 8in step: P:109*  On leg press: P:115* Supine: A:113 P: 119 Supine: A: 116 P:121  Knee extension Standing A: -16* P: -5* painful  end feel  Seated LAQ -38* Supine  Quad set -10* P: -4* Supine: A:-6  Supine with heel prop: A: -7 P: -1 Seated LAQ: 30deg of flexion Standing  TKE -7*  Ankle dorsiflexion       Ankle plantarflexion       Ankle inversion       Ankle eversion        (Blank rows = not tested)  LOWER EXTREMITY MMT:  MMT Right eval Left eval  Hip flexion    Hip extension    Hip abduction    Hip adduction    Hip internal rotation    Hip external rotation    Knee flexion  3-/5  Knee extension  3-/5  Ankle dorsiflexion    Ankle plantarflexion    Ankle inversion    Ankle eversion     (Blank rows = not tested)  FUNCTIONAL TESTS:  Eval:  18 inch chair transfer: use of BUE on armrest and use of walker; keeps L leg in front to prevent increased knee flexion when standing and sitting  GAIT: Eval: Distance walked: 2' x2  Assistive device utilized: Environmental consultant - 2 wheeled Level of assistance: supervision / needs cues for deviations Comments: antalgic with decreased single leg stance time, bilateral internal rotation (toe-in) gait with L>R,  L knee flexion in stance with minimal to no increase for swing, appropriate initial contact with heel strike bilaterally, decreased step length,   TODAY'S TREATMENT                                                                           Date: 08/22/2023 Therapeutic Exercise: -Precor recumbent bike seat 8, full revolutions, 8 min, L4 -Gastroc stretch 30 sec  hold 3 reps with calf raises BLEs 20 reps bw stretch -Leg Press BLEs 100# 10 reps 2 sets; LLE only 50# 10 reps 2 sets -LAQ machine BLE and SL -LLE TKE green theraband 10 reps 1st set only TKE, 2nd set TKE + SLS (tapping RLE to 6" step)    TREATMENT  Date: 08/22/2023 Therapeutic Exercise: -Precor recumbent bike seat 8, full revolutions, 8 min, L4 -Gastroc stretch off 6in step 30 sec hold 3 reps with calf raises BLEs 20 reps bw stretch -Leg Press BLEs 100# 10 reps 2 sets; LLE only 50# 10 reps 2 sets -seated LAQ with strap assist for terminal knee extension beyond current AROM, 15 reps with PT verbal & demo cues on technique.   Patient had to leave early due to having another appointment he needed to travel for.   TREATMENT                                                                           Date: 08/17/2023 Therapeutic Exercise: Leg Press BLEs 100# 10 reps 2 sets; LLE only 50# 10 reps 2 sets; Gastroc stretch 30 sec hold 3 reps with calf raises BLEs 20 reps bw stretch Precor recumbent bike #8, full revolutions, 8 min, L4 LLE TKE green theraband 10 reps 1st set only TKE, 2nd set TKE + SLS (tapping RLE to 6" step); LLE step up 6" step BUE support 1 rep with cues on technique, then 5 reps with LLE TKE  Slider to 3 cones ( ant, lat & post) with stance LE flexion on outward motion & ext inward motion 5 reps ea LE with single UE support on sink Knee ext machine 5# BLE concentric & isometric / eccentric 5 reps 2 sets    TREATMENT                                                                           Date: 08/12/23 Therex: Precor #9, full revolutions, 8 min, L4 Slantboard gastroc stretch 30 sec x 4 LLE TKE standing ball squeeze against wall x10 5sec hold; vc and tc for proper quad activation  Leg press bilateral L LE 50# 2x10; vc for proper form and control descent  Gait: -Bkwds walk x3 bouts for promotion of knee  extension; vc for quad activation, cane used for stability, CGA for safety -Attempted hurdles however due to concerns with balance- activity was terminated -Hurdle with wall assist on L and cane on R x15 for knee flexion and quad activation with heel strike; vc for driving knee forward and for quad activation upon heel strike -Exaggerated heel strike with knee extension x2 bouts; VC for quad activation at heel strike, use of cane, CGA for safety   Manual: -Seated knee flexion with overpressure to patient tolerance for facilitation of movement -Supine knee extension with overpressure to patient tolerance for facilitation of extension and mobility. Patellar glides all directions included for increased knee mobility     TREATMENT  Date: 08/10/23 Therex: Sci fit bike seat #9, full revolutions, 8 min, L6 BLEs only Slantboard gastroc stretch 30 sec x 4 LLE TKE Blue Theraband x10 5sec hold; vc and tc for proper quad activation  LLE TKE Blue Theraband  x10 with hold while RLE taps object on floor. Promotion of knee extension during SL stance; vc for reaching and maintaining full extension Leg press bilateral 75# 2X10, L LE 50# 2x10; vc for proper form and weight to prevent injury or over working of knee musculature Seated LAQ with strap assist at terminal extension x10 with 5sec hold; vc for AROM before use of strap to assist to reach full range   Manual: Seated scar mobilizations with and without instruments for facilitation of mobility and prevention of adhesions       Declined vaso    PATIENT EDUCATION:  Education details: HEP, POC, elevation Person educated: Patient Education method: Programmer, multimedia, Demonstration, Verbal cues, and Handouts Education comprehension: verbalized understanding, returned demonstration, and verbal cues required  HOME EXERCISE PROGRAM: Medbridge Code: [id number]  Exercises - Ankle Alphabet  in Elevation  - 2-4 x daily - 7 x weekly - 1 sets - 1 reps - Quad Setting and Stretching  - 2-4 x daily - 7 x weekly - 5-10 sets - 10 reps - prop 5-10 minutes & quad set5 seconds hold - Supine Heel Slide with Strap  - 2-3 x daily - 7 x weekly - 2-3 sets - 10 reps - 5 seconds hold - Supine Straight Leg Raises  - 2-3 x daily - 7 x weekly - 2-3 sets - 10 reps - 5 seconds hold - Seated Knee Flexion Extension AROM   - 2-4 x daily - 7 x weekly - 2-3 sets - 10 reps - 5 seconds hold - Seated straight leg lifts  - 2-3 x daily - 7 x weekly - 2-3 sets - 10 reps - 5 seconds hold - Seated Hamstring Stretch with Strap  - 2-4 x daily - 7 x weekly - 1 sets - 3 reps - 30 seconds hold - Standing Quad Set  - 2-3 x daily - 7 x weekly - 2 sets - 10 reps - 5 seconds hold -Ice massage  -Scar mobilization   ASSESSMENT: CLINICAL IMPRESSION: Patient continues to demonstrate deficits in quad strength as noted during seated LAQ as the patient required assist from strap to reach full ROM. Patient will benefit from skilled physical therapy to address deficits.    OBJECTIVE IMPAIRMENTS: Abnormal gait, decreased activity tolerance, decreased balance, decreased endurance, decreased knowledge of condition, decreased knowledge of use of DME, decreased mobility, difficulty walking, decreased ROM, decreased strength, increased edema, impaired sensation, postural dysfunction, and pain.   ACTIVITY LIMITATIONS: carrying, lifting, bending, sitting, standing, squatting, sleeping, stairs, transfers, and locomotion level  PARTICIPATION LIMITATIONS: meal prep, cleaning, laundry, and community activity  PERSONAL FACTORS: Time since onset of injury/illness/exacerbation, 3+ comorbidities: see PMH, and Decreased pain medication use with increased pain and interference of sleep  are also affecting patient's functional outcome.   REHAB POTENTIAL: Excellent  CLINICAL DECISION MAKING: Stable/uncomplicated  EVALUATION COMPLEXITY:  Low   GOALS: Goals reviewed with patient? Yes  SHORT TERM GOALS: (target date for Short term goals 08/19/23)   1.  Patient will demonstrate independent use of home exercise program to maintain progress from in clinic treatments. Baseline: See objective data Goal status: On going 08/10/2023  2. PROM left knee -5* ext to 105* flex to facilitate proper gait mechanics  Baseline: See  objective data Goal status: MET 08/12/2023  3. Interim FOTO 37% Baseline: See objective data Goal status:  On going 08/10/2023  LONG TERM GOALS: (target dates for all long term goals  10/02/23 )   1. Patient will demonstrate/report pain at worst less than or equal to 2/10 to facilitate minimal limitation in daily activity secondary to pain symptoms. Baseline: See objective data Goal status: On going 08/10/2023   2. Patient will demonstrate independent use of home exercise program to facilitate ability to maintain/progress functional gains from skilled physical therapy services. Baseline: See objective data Goal status: On going 08/10/2023   3. Patient will demonstrate FOTO outcome > or = 47 % to indicate reduced disability due to condition. Baseline: See objective data Goal status: On going 08/10/2023   4.  Patient will demonstrate left knee LE MMT 5/5 throughout to faciltiate usual transfers, stairs, squatting at [facility] for daily life.  Baseline: See objective data Goal status: On going 08/10/2023   5.  Patient will demonstrate left knee AROM 0* to 110* for higher functional demands for daily activities Baseline: See objective data Goal status: On going 08/10/2023   6.  Patient will ambulate >579ft and negotiate ramps, curbs & stairs single rail independently for safety with community ambulation needs Baseline: See objective data Goal status: On going 08/10/2023   PLAN:  PT FREQUENCY:  2x/week  PT DURATION: 10 weeks  PLANNED INTERVENTIONS: 97164- PT Re-evaluation, 97110-Therapeutic exercises, 97530-  Therapeutic activity, 97112- Neuromuscular re-education, 97535- Self Care, 78469- Manual therapy, [phone]- Gait training, [phone]- Electrical stimulation (manual), 97016- Vasopneumatic device, Patient/Family education, Balance training, Stair training, Taping, Joint mobilization, Scar mobilization, DME instructions, and Cryotherapy  PLAN FOR NEXT SESSION:  Updated HEP including TKE with band,  do interim FOTO, Address deficits in knee extension both active and passively, functional activity, and gait mechanics.   Darvell Monteforte, Dayon Witt, Student-PT 09/01/2023, 7:56 AM

## 2023-09-01 NOTE — Telephone Encounter (Signed)
Patient did not show for 8:45 appt.  He is still sick.  Wants to cancel tomorrow as well.

## 2023-09-02 ENCOUNTER — Encounter: Payer: No Typology Code available for payment source | Admitting: Physical Therapy

## 2023-09-07 ENCOUNTER — Ambulatory Visit (INDEPENDENT_AMBULATORY_CARE_PROVIDER_SITE_OTHER): Payer: No Typology Code available for payment source | Admitting: Physical Therapy

## 2023-09-07 ENCOUNTER — Encounter: Payer: Self-pay | Admitting: Physical Therapy

## 2023-09-07 DIAGNOSIS — R600 Localized edema: Secondary | ICD-10-CM

## 2023-09-07 DIAGNOSIS — R2689 Other abnormalities of gait and mobility: Secondary | ICD-10-CM | POA: Diagnosis not present

## 2023-09-07 DIAGNOSIS — M25662 Stiffness of left knee, not elsewhere classified: Secondary | ICD-10-CM | POA: Diagnosis not present

## 2023-09-07 DIAGNOSIS — M6281 Muscle weakness (generalized): Secondary | ICD-10-CM

## 2023-09-07 DIAGNOSIS — M25562 Pain in left knee: Secondary | ICD-10-CM

## 2023-09-07 NOTE — Therapy (Addendum)
 OUTPATIENT PHYSICAL THERAPY TREATMENT  Patient Name: Justin Nolan MRN: [id number] DOB:[birth year], 63 y.o., male Today's Date: 09/07/2023  END OF SESSION:  PT End of Session - 09/07/23 0854     Visit Number 9    Number of Visits 20    Date for PT Re-Evaluation 10/02/23    Authorization Type [facility]    Authorization Time Period 15 visits    Authorization - Visit Number 9    Authorization - Number of Visits 15    Progress Note Due on Visit 10    PT Start Time 0846    PT Stop Time 0931    PT Time Calculation (min) 45 min    Activity Tolerance Patient tolerated treatment well;Patient limited by pain    Behavior During Therapy [facility] for tasks assessed/performed              Past Medical History:  Diagnosis Date   Anxiety    Arthritis    BPH (benign prostatic hyperplasia)    GERD (gastroesophageal reflux disease)    Hypertension    Major depressive disorder    Pneumonia 02/09/2012   PTSD (post-traumatic stress disorder)    Sleep apnea    does not wear cpap   Past Surgical History:  Procedure Laterality Date   KNEE SURGERY     ROTATOR CUFF REPAIR Left 2020   TOTAL KNEE ARTHROPLASTY Right 05/07/2017   Procedure: RIGHT TOTAL KNEE ARTHROPLASTY;  Surgeon: Tarry Kos, MD;  Location: [facility] OR;  Service: Orthopedics;  Laterality: Right;   TOTAL KNEE ARTHROPLASTY Left 06/29/2023   Procedure: LEFT TOTAL KNEE REPLACEMENT;  Surgeon: Tarry Kos, MD;  Location: [facility] OR;  Service: Orthopedics;  Laterality: Left;   Patient Active Problem List   Diagnosis Date Noted   Primary osteoarthritis of left knee 06/29/2023   Status post total left knee replacement 06/29/2023   Chronic low back pain 02/11/2018   Alcohol abuse 02/16/2012   HTN (hypertension) 02/09/2012   GERD (gastroesophageal reflux disease) 02/09/2012   Tobacco use 02/09/2012   Indirect hyperbilirubinemia 02/09/2012    PCP: [facility]  REFERRING PROVIDER: Cristie Hem, PA-C  REFERRING DIAG: [phone]  (ICD-10-CM) - Status post total left knee replacement  THERAPY DIAG:  Acute pain of left knee  Stiffness of left knee, not elsewhere classified  Other abnormalities of gait and mobility  Muscle weakness (generalized)  Localized edema  Rationale for Evaluation and Treatment: Rehabilitation  ONSET DATE: 06/29/23 Left TKA  SUBJECTIVE:   SUBJECTIVE STATEMENT: Has been having some pain on the medial (VMO) and lateral knee when first standing that goes away after he walks a little bit. First visit back after being sick, has been doing HEP and walking around the house.   PERTINENT HISTORY: Left TKA (06/29/23), Shoulder arthroscopy w/ RTC repair (03/16/20), Right TKA, L knee OA, Anxiety, arthritis, GERD, HTN, PTSD, Major depressive disorder  PAIN:  NPRS scale: 3-4/10 in the 2 spots around his knee,   Pain location: medial side of L knee, no calf pain, mild inguinal  Pain description: constant dull pain, sharp, muscle spasms Aggravating factors: use of knee, lack of meds, initial standing /walking Relieving factors: ice and rest  PRECAUTIONS: Fall  WEIGHT BEARING RESTRICTIONS:  WBAT  FALLS:  Has patient fallen in last 6 months? No  LIVING ENVIRONMENT: Lives with: lives alone Lives in: House/apartment Stairs: Yes: External: 1 steps; none, back steps: 3 steps without rail Has following equipment at home: Single point cane and  Walker - 2 wheeled  OCCUPATION: retired, on disability  LEISURE ACTIVITIES: staying healthy   PLOF: Independent  PATIENT GOALS:  To be pain free and be more independent   Next MD visit: around 08/11/23 Dr. Roda Shutters  OBJECTIVE:  DIAGNOSTIC FINDINGS: 06/29/23 XRAY Left knee arthroplasty in expected alignment. No periprosthetic lucency or fracture. Recent postsurgical change includes air and edema in the soft tissues and joint space.  PATIENT SURVEYS:  FOTO intake:  31%  predicted:  47%  COGNITION: Overall cognitive status:  WFL    SENSATION: WFL  EDEMA:  LLE: above knee 43.5 cm,  around knee 43.25 cm, below knee  39.5 cm gastroc apex 36.5 cm RLE: above knee 40 cm,  around knee 35.5 cm, below knee  33 cm gastroc apex 34 cm  POSTURE:  Standing: flexed trunk with slightly rounded shoulders, weight shift to right Seated: rounded shoulders, keeps L knee outstretched due to pain with knee flexion requiring 90* or greater  PALPATION: Medial L knee tenderness, no quad tenderness Denies lateral knee pain or calf pain Knee swelling with calor and rubor of incision site Verbalizes difference in sensation of lateral area of knee   LOWER EXTREMITY ROM:   ROM P: passive  A:active Left eval Left 07/29/23 Left  08/03/23 Left  08/12/23 Left 08/19/23 Left 09/07/2023  Hip flexion        Hip extension        Hip abduction        Hip adduction        Hip internal rotation        Hip external rotation        Knee flexion Seated A: 89* P: 94* Painful end feel Supine A:  91* Pt declined passive Supine A:105 P:111 Standing //bar and 8in step: P:109*  On leg press: P:115* Supine: A:113 P: 119 Supine: A: 116 P:121 Supine:  A:120 P: 123  Knee extension Standing A: -16* P: -5* painful  end feel  Seated LAQ -38* Supine  Quad set -10* P: -4* Supine: A:-6  Supine with heel prop: A: -7 P: -1 Seated LAQ: 30deg of flexion Standing  TKE -7* Supine: A: 0  Ankle dorsiflexion        Ankle plantarflexion        Ankle inversion        Ankle eversion         (Blank rows = not tested)  LOWER EXTREMITY MMT:  MMT Right eval Left eval  Hip flexion    Hip extension    Hip abduction    Hip adduction    Hip internal rotation    Hip external rotation    Knee flexion  3-/5  Knee extension  3-/5  Ankle dorsiflexion    Ankle plantarflexion    Ankle inversion    Ankle eversion     (Blank rows = not tested)  FUNCTIONAL TESTS:  Eval:  18 inch chair transfer: use of BUE on armrest and use of walker;  keeps L leg in front to prevent increased knee flexion when standing and sitting  GAIT: Eval: Distance walked: 28' x2  Assistive device utilized: Environmental consultant - 2 wheeled Level of assistance: supervision / needs cues for deviations Comments: antalgic with decreased single leg stance time, bilateral internal rotation (toe-in) gait with L>R,  L knee flexion in stance with minimal to no increase for swing, appropriate initial contact with heel strike bilaterally, decreased step length,   TODAY'S TODAY'S TREATMENT  Date: 09/07/2023 Therapeutic Exercise: -Precor recumbent bike seat 8, full revolutions, 8 min, L4 -supine leg raise with quad set 2x10; vc and tc for quad set before lift  Theract: -Stair negotiation descended 7 steps with BUE in reciprocal pattern with heavy UE use, then 4 rounds of 4 steps in step to pattern with LLE as the working leg with 1 rail, then ascended stairs with R rail in reciprocal pattern. Patient educated on modification with negotiation of stairs, use of hand railing in community, and practicing at home safely for increased strengthening of quad. Pt verbalized understanding. -Mini squats 2x10 with chair behind; vc and tc for proper weight distribution to LLE -LAQ with 2.5lb ankle weight 2x10 with vc for eccentric control  Manual: Contract relax flexion/extension for improved ROM Flexion with over pressure to patient tolerance   TREATMENT                                                                           Date: 08/19/2023 Therapeutic Exercise: -Precor recumbent bike seat 8, full revolutions, 8 min, L4 -Gastroc stretch off 6in step 30 sec hold 3 reps with calf raises BLEs 20 reps bw stretch -Leg Press BLEs 100# 10 reps 2 sets; LLE only 50# 10 reps 2 sets -seated LAQ with strap assist for terminal knee extension beyond current AROM, 15 reps with PT verbal & demo cues on technique.   Patient  had to leave early due to having another appointment he needed to travel for.   TREATMENT                                                                           Date: 08/17/2023 Therapeutic Exercise: Leg Press BLEs 100# 10 reps 2 sets; LLE only 50# 10 reps 2 sets; Gastroc stretch 30 sec hold 3 reps with calf raises BLEs 20 reps bw stretch Precor recumbent bike #8, full revolutions, 8 min, L4 LLE TKE green theraband 10 reps 1st set only TKE, 2nd set TKE + SLS (tapping RLE to 6" step); LLE step up 6" step BUE support 1 rep with cues on technique, then 5 reps with LLE TKE  Slider to 3 cones ( ant, lat & post) with stance LE flexion on outward motion & ext inward motion 5 reps ea LE with single UE support on sink Knee ext machine 5# BLE concentric & isometric / eccentric 5 reps 2 sets    PATIENT EDUCATION:  Education details: HEP, POC, elevation Person educated: Patient Education method: Programmer, multimedia, Demonstration, Verbal cues, and Handouts Education comprehension: verbalized understanding, returned demonstration, and verbal cues required  HOME EXERCISE PROGRAM: Medbridge Code: [id number]  Exercises - Ankle Alphabet in Elevation  - 2-4 x daily - 7 x weekly - 1 sets - 1 reps - Quad Setting and Stretching  - 2-4 x daily - 7 x weekly - 5-10 sets - 10 reps - prop 5-10 minutes & quad set5 seconds hold -  Supine Heel Slide with Strap  - 2-3 x daily - 7 x weekly - 2-3 sets - 10 reps - 5 seconds hold - Supine Straight Leg Raises  - 2-3 x daily - 7 x weekly - 2-3 sets - 10 reps - 5 seconds hold - Seated Knee Flexion Extension AROM   - 2-4 x daily - 7 x weekly - 2-3 sets - 10 reps - 5 seconds hold - Seated straight leg lifts  - 2-3 x daily - 7 x weekly - 2-3 sets - 10 reps - 5 seconds hold - Seated Hamstring Stretch with Strap  - 2-4 x daily - 7 x weekly - 1 sets - 3 reps - 30 seconds hold - Standing Quad Set  - 2-3 x daily - 7 x weekly - 2 sets - 10 reps - 5 seconds hold -Ice massage  -Scar  mobilization   ASSESSMENT: CLINICAL IMPRESSION: Patient did well with activity however still demonstrates strength impairments with quads as seen during stair negotiation and SLR. Patient continues to demonstrate good ROM and is making progress towards goals. Patient will benefit from continued skilled physical therapy to address deficits for safe reentry into community.     OBJECTIVE IMPAIRMENTS: Abnormal gait, decreased activity tolerance, decreased balance, decreased endurance, decreased knowledge of condition, decreased knowledge of use of DME, decreased mobility, difficulty walking, decreased ROM, decreased strength, increased edema, impaired sensation, postural dysfunction, and pain.   ACTIVITY LIMITATIONS: carrying, lifting, bending, sitting, standing, squatting, sleeping, stairs, transfers, and locomotion level  PARTICIPATION LIMITATIONS: meal prep, cleaning, laundry, and community activity  PERSONAL FACTORS: Time since onset of injury/illness/exacerbation, 3+ comorbidities: see PMH, and Decreased pain medication use with increased pain and interference of sleep  are also affecting patient's functional outcome.   REHAB POTENTIAL: Excellent  CLINICAL DECISION MAKING: Stable/uncomplicated  EVALUATION COMPLEXITY: Low   GOALS: Goals reviewed with patient? Yes  SHORT TERM GOALS: (target date for Short term goals 08/19/23)   1.  Patient will demonstrate independent use of home exercise program to maintain progress from in clinic treatments. Baseline: See objective data Goal status: On going 08/10/2023  2. PROM left knee -5* ext to 105* flex to facilitate proper gait mechanics  Baseline: See objective data Goal status: MET 08/12/2023  3. Interim FOTO 37% Baseline: See objective data Goal status:  On going 08/10/2023  LONG TERM GOALS: (target dates for all long term goals  10/02/23 )   1. Patient will demonstrate/report pain at worst less than or equal to 2/10 to facilitate minimal  limitation in daily activity secondary to pain symptoms. Baseline: See objective data Goal status: On going 08/10/2023   2. Patient will demonstrate independent use of home exercise program to facilitate ability to maintain/progress functional gains from skilled physical therapy services. Baseline: See objective data Goal status: On going 08/10/2023   3. Patient will demonstrate FOTO outcome > or = 47 % to indicate reduced disability due to condition. Baseline: See objective data Goal status: On going 08/10/2023   4.  Patient will demonstrate left knee LE MMT 5/5 throughout to faciltiate usual transfers, stairs, squatting at [facility] for daily life.  Baseline: See objective data Goal status: On going 08/10/2023   5.  Patient will demonstrate left knee AROM 0* to 110* for higher functional demands for daily activities Baseline: See objective data Goal status: MET 09/07/2023   6.  Patient will ambulate >557ft and negotiate ramps, curbs & stairs single rail independently for safety with community ambulation needs  Baseline: See objective data Goal status: On going 08/10/2023   PLAN:  PT FREQUENCY:  2x/week  PT DURATION: 10 weeks  PLANNED INTERVENTIONS: 97164- PT Re-evaluation, 97110-Therapeutic exercises, 97530- Therapeutic activity, 97112- Neuromuscular re-education, 97535- Self Care, [id number]- Manual therapy, [phone]- Gait training, [phone]- Electrical stimulation (manual), [phone]- Vasopneumatic device, Patient/Family education, Balance training, Stair training, Taping, Joint mobilization, Scar mobilization, DME instructions, and Cryotherapy  PLAN FOR NEXT SESSION:  Do 10th visit progress note, Continue quad strengthening and control, revisit stairs, functional activity, and gait mechanics.   Annalaura Sauseda, Dequane Strahan, Student-PT 09/07/2023, 9:35 AM  This entire session of physical therapy was performed under the direct supervision of PT signing evaluation /treatment. PT reviewed note and  agrees.   Vladimir Faster, PT, DPT 09/07/2023, 10:13 AM

## 2023-09-10 ENCOUNTER — Encounter: Payer: Self-pay | Admitting: Physical Therapy

## 2023-09-10 ENCOUNTER — Ambulatory Visit (INDEPENDENT_AMBULATORY_CARE_PROVIDER_SITE_OTHER): Payer: No Typology Code available for payment source | Admitting: Physical Therapy

## 2023-09-10 DIAGNOSIS — R2689 Other abnormalities of gait and mobility: Secondary | ICD-10-CM

## 2023-09-10 DIAGNOSIS — R600 Localized edema: Secondary | ICD-10-CM

## 2023-09-10 DIAGNOSIS — M25562 Pain in left knee: Secondary | ICD-10-CM

## 2023-09-10 DIAGNOSIS — M25662 Stiffness of left knee, not elsewhere classified: Secondary | ICD-10-CM

## 2023-09-10 DIAGNOSIS — M6281 Muscle weakness (generalized): Secondary | ICD-10-CM

## 2023-09-10 NOTE — Therapy (Addendum)
 OUTPATIENT PHYSICAL THERAPY TREATMENT AND PROGRESS NOTE  Patient Name: Justin Nolan MRN: [id number] DOB:[birth year], 63 y.o., male Today's Date: 09/10/2023  END OF SESSION:  PT End of Session - 09/10/23 0842     Visit Number 10    Number of Visits 20    Date for PT Re-Evaluation 10/02/23    Authorization Type [facility]    Authorization Time Period 15 visits    Authorization - Visit Number 10    Authorization - Number of Visits 15    Progress Note Due on Visit 10    PT Start Time 0845    PT Stop Time 0930    PT Time Calculation (min) 45 min    Activity Tolerance Patient tolerated treatment well;Patient limited by pain    Behavior During Therapy [facility] for tasks assessed/performed               Past Medical History:  Diagnosis Date   Anxiety    Arthritis    BPH (benign prostatic hyperplasia)    GERD (gastroesophageal reflux disease)    Hypertension    Major depressive disorder    Pneumonia 02/09/2012   PTSD (post-traumatic stress disorder)    Sleep apnea    does not wear cpap   Past Surgical History:  Procedure Laterality Date   KNEE SURGERY     ROTATOR CUFF REPAIR Left 2020   TOTAL KNEE ARTHROPLASTY Right 05/07/2017   Procedure: RIGHT TOTAL KNEE ARTHROPLASTY;  Surgeon: Tarry Kos, MD;  Location: [facility] OR;  Service: Orthopedics;  Laterality: Right;   TOTAL KNEE ARTHROPLASTY Left 06/29/2023   Procedure: LEFT TOTAL KNEE REPLACEMENT;  Surgeon: Tarry Kos, MD;  Location: [facility] OR;  Service: Orthopedics;  Laterality: Left;   Patient Active Problem List   Diagnosis Date Noted   Primary osteoarthritis of left knee 06/29/2023   Status post total left knee replacement 06/29/2023   Chronic low back pain 02/11/2018   Alcohol abuse 02/16/2012   HTN (hypertension) 02/09/2012   GERD (gastroesophageal reflux disease) 02/09/2012   Tobacco use 02/09/2012   Indirect hyperbilirubinemia 02/09/2012    PCP: [facility]  REFERRING PROVIDER: Ivin Poot  REFERRING DIAG: [phone] (ICD-10-CM) - Status post total left knee replacement  THERAPY DIAG:  Acute pain of left knee  Stiffness of left knee, not elsewhere classified  Other abnormalities of gait and mobility  Localized edema  Muscle weakness (generalized)  Rationale for Evaluation and Treatment: Rehabilitation  ONSET DATE: 06/29/23 Left TKA  SUBJECTIVE:   SUBJECTIVE STATEMENT: Feels the same since last time. Reports walking around without the cane more without issue. Has done some steps and reports going to a friends place that required him to walk up/down 3 flights of stairs.   PERTINENT HISTORY: Left TKA (06/29/23), Shoulder arthroscopy w/ RTC repair (03/16/20), Right TKA, L knee OA, Anxiety, arthritis, GERD, HTN, PTSD, Major depressive disorder  PAIN:  NPRS scale: 3-4/10 in the 2 spots around his knee   Pain location: medial side of L knee, no calf pain, mild inguinal  Pain description: constant dull pain, sharp, muscle spasms Aggravating factors: use of knee, lack of meds, initial standing /walking Relieving factors: ice and rest  PRECAUTIONS: Fall  WEIGHT BEARING RESTRICTIONS:  WBAT  FALLS:  Has patient fallen in last 6 months? No  LIVING ENVIRONMENT: Lives with: lives alone Lives in: House/apartment Stairs: Yes: External: 1 steps; none, back steps: 3 steps without rail Has following equipment at home: Single point cane  and Walker - 2 wheeled  OCCUPATION: retired, on disability  LEISURE ACTIVITIES: staying healthy   PLOF: Independent  PATIENT GOALS:  To be pain free and be more independent   Next MD visit: around 08/11/23 Dr. Roda Shutters  OBJECTIVE:  DIAGNOSTIC FINDINGS: 06/29/23 XRAY Left knee arthroplasty in expected alignment. No periprosthetic lucency or fracture. Recent postsurgical change includes air and edema in the soft tissues and joint space.  PATIENT SURVEYS:  09/10/2023: FOTO: 55%   FOTO intake:  31%  predicted:   47%  COGNITION: Overall cognitive status: WFL    SENSATION: WFL  EDEMA:  LLE: above knee 43.5 cm,  around knee 43.25 cm, below knee  39.5 cm gastroc apex 36.5 cm RLE: above knee 40 cm,  around knee 35.5 cm, below knee  33 cm gastroc apex 34 cm  POSTURE:  Standing: flexed trunk with slightly rounded shoulders, weight shift to right Seated: rounded shoulders, keeps L knee outstretched due to pain with knee flexion requiring 90* or greater  PALPATION: Medial L knee tenderness, no quad tenderness Denies lateral knee pain or calf pain Knee swelling with calor and rubor of incision site Verbalizes difference in sensation of lateral area of knee   LOWER EXTREMITY ROM:   ROM P: passive  A:active Left eval Left 07/29/23 Left  08/03/23 Left  08/12/23 Left 08/19/23 Left 09/07/2023 Left 09/10/2023  Hip flexion         Hip extension         Hip abduction         Hip adduction         Hip internal rotation         Hip external rotation         Knee flexion Seated A: 89* P: 94* Painful end feel Supine A:  91* Pt declined passive Supine A:105 P:111 Standing //bar and 8in step: P:109*  On leg press: P:115* Supine: A:113 P: 119 Supine: A: 116 P:121 Supine:  A:120 P: 123 Supine: A:121  Knee extension Standing A: -16* P: -5* painful  end feel  Seated LAQ -38* Supine  Quad set -10* P: -4* Supine: A:-6  Supine with heel prop: A: -7 P: -1 Seated LAQ: 30deg of flexion Standing  TKE -7* Supine: A: 0 Supine:  A:0  Ankle dorsiflexion         Ankle plantarflexion         Ankle inversion         Ankle eversion          (Blank rows = not tested)  LOWER EXTREMITY MMT:  MMT Left eval Right 09/10/2023 dynamometer Left  09/10/2023 dyamometer  Hip flexion     Hip extension     Hip abduction     Hip adduction     Hip internal rotation     Hip external rotation     Knee flexion 3-/5    Knee extension 3-/5 70.4#, 70.1# 35.4#, 38# LLE:RLE 52%  Ankle dorsiflexion      Ankle plantarflexion     Ankle inversion     Ankle eversion      (Blank rows = not tested)  FUNCTIONAL TESTS:  Eval:  18 inch chair transfer: use of BUE on armrest and use of walker; keeps L leg in front to prevent increased knee flexion when standing and sitting  GAIT: Eval: Distance walked: 48' x2  Assistive device utilized: Environmental consultant - 2 wheeled Level of assistance: supervision / needs cues for deviations Comments: antalgic  with decreased single leg stance time, bilateral internal rotation (toe-in) gait with L>R,  L knee flexion in stance with minimal to no increase for swing, appropriate initial contact with heel strike bilaterally, decreased step length,   TODAY'S  TREATMENT                                                                           Date: 09/10/2023 Therapeutic Exercise: -Precor recumbent bike seat 8, full revolutions, 8 min, L4 -Leg press BLEs 112# 2x10; LLE only 56# 2x10 sets; vc for proper  -LAQ machine LLE only 5lb 2x10 -gasatroc stretch off step 3x30sec  Theract: -Stair negotiation with 1 handrail x2 flights SBA throughout: descending: with 4 steps LLE step to then 4 steps RLE leading then 3 steps alternating.  Ascending 4 step to with LLE leading then patient continuing on with reciprocal pattern. Descending: step to with alternating lead leg. Patient does best with step to pattern with alternating lead leg demonstrating increased confidence and weight acceptance to LLE   TREATMENT                                                                           Date: 09/07/2023 Therapeutic Exercise: -Precor recumbent bike seat 8, full revolutions, 8 min, L4 -supine leg raise with quad set 2x10; vc and tc for quad set before lift  Theract: -Stair negotiation descended 7 steps with BUE in reciprocal pattern with heavy UE use, then 4 rounds of 4 steps in step to pattern with LLE as the working leg with 1 rail, then ascended stairs with R rail in reciprocal pattern.  Patient educated on modification with negotiation of stairs, use of hand railing in community, and practicing at home safely for increased strengthening of quad. Pt verbalized understanding. -Mini squats 2x10 with chair behind; vc and tc for proper weight distribution to LLE -LAQ with 2.5lb ankle weight 2x10 with vc for eccentric control  Manual: Contract relax flexion/extension for improved ROM Flexion with over pressure to patient tolerance   TREATMENT                                                                           Date: 08/19/2023 Therapeutic Exercise: -Precor recumbent bike seat 8, full revolutions, 8 min, L4 -Gastroc stretch off 6in step 30 sec hold 3 reps with calf raises BLEs 20 reps bw stretch -Leg Press BLEs 100# 10 reps 2 sets; LLE only 50# 10 reps 2 sets -seated LAQ with strap assist for terminal knee extension beyond current AROM, 15 reps with PT verbal & demo cues on technique.   Patient had to leave early due to having another appointment he  needed to travel for.    PATIENT EDUCATION:  Education details: HEP, POC, elevation Person educated: Patient Education method: Programmer, multimedia, Demonstration, Verbal cues, and Handouts Education comprehension: verbalized understanding, returned demonstration, and verbal cues required  HOME EXERCISE PROGRAM: Medbridge Code: [id number]  Exercises - Ankle Alphabet in Elevation  - 2-4 x daily - 7 x weekly - 1 sets - 1 reps - Quad Setting and Stretching  - 2-4 x daily - 7 x weekly - 5-10 sets - 10 reps - prop 5-10 minutes & quad set5 seconds hold - Supine Heel Slide with Strap  - 2-3 x daily - 7 x weekly - 2-3 sets - 10 reps - 5 seconds hold - Supine Straight Leg Raises  - 2-3 x daily - 7 x weekly - 2-3 sets - 10 reps - 5 seconds hold - Seated Knee Flexion Extension AROM   - 2-4 x daily - 7 x weekly - 2-3 sets - 10 reps - 5 seconds hold - Seated straight leg lifts  - 2-3 x daily - 7 x weekly - 2-3 sets - 10 reps - 5 seconds hold -  Seated Hamstring Stretch with Strap  - 2-4 x daily - 7 x weekly - 1 sets - 3 reps - 30 seconds hold - Standing Quad Set  - 2-3 x daily - 7 x weekly - 2 sets - 10 reps - 5 seconds hold -Ice massage  -Scar mobilization   ASSESSMENT: CLINICAL IMPRESSION: Since eval patient has shown improvement in ROM and strength however still demonstrates deficits in quad strength as seen in activities,dynamometer reading, and during ambulation. FOTO has exceeded predicted however patient will continue to benefit from skilled physical therapy to address deficits and deviations in gait.    OBJECTIVE IMPAIRMENTS: Abnormal gait, decreased activity tolerance, decreased balance, decreased endurance, decreased knowledge of condition, decreased knowledge of use of DME, decreased mobility, difficulty walking, decreased ROM, decreased strength, increased edema, impaired sensation, postural dysfunction, and pain.   ACTIVITY LIMITATIONS: carrying, lifting, bending, sitting, standing, squatting, sleeping, stairs, transfers, and locomotion level  PARTICIPATION LIMITATIONS: meal prep, cleaning, laundry, and community activity  PERSONAL FACTORS: Time since onset of injury/illness/exacerbation, 3+ comorbidities: see PMH, and Decreased pain medication use with increased pain and interference of sleep  are also affecting patient's functional outcome.   REHAB POTENTIAL: Excellent  CLINICAL DECISION MAKING: Stable/uncomplicated  EVALUATION COMPLEXITY: Low   GOALS: Goals reviewed with patient? Yes  SHORT TERM GOALS: (target date for Short term goals 08/19/23)   1.  Patient will demonstrate independent use of home exercise program to maintain progress from in clinic treatments. Baseline: See objective data Goal status: MET 09/10/2023  2. PROM left knee -5* ext to 105* flex to facilitate proper gait mechanics  Baseline: See objective data Goal status: MET 08/12/2023  3. Interim FOTO 37% Baseline: See objective data Goal  status:  MET 09/10/2023  LONG TERM GOALS: (target dates for all long term goals  10/02/23 )   1. Patient will demonstrate/report pain at worst less than or equal to 2/10 to facilitate minimal limitation in daily activity secondary to pain symptoms. Baseline: See objective data Goal status: On going 08/10/2023   2. Patient will demonstrate independent use of home exercise program to facilitate ability to maintain/progress functional gains from skilled physical therapy services. Baseline: See objective data Goal status: On going 08/10/2023   3. Patient will demonstrate FOTO outcome > or = 47 % to indicate reduced disability due to condition. Baseline: See objective  data Goal status: MET 09/10/2023   4.  Patient will demonstrate left knee LE MMT 5/5 throughout to faciltiate usual transfers, stairs, squatting at [facility] for daily life.  Baseline: See objective data Goal status: On going 08/10/2023   5.  Patient will demonstrate left knee AROM 0* to 110* for higher functional demands for daily activities Baseline: See objective data Goal status: MET 09/07/2023   6.  Patient will ambulate >577ft and negotiate ramps, curbs & stairs single rail independently for safety with community ambulation needs Baseline: See objective data Goal status: On going 08/10/2023   PLAN:  PT FREQUENCY:  2x/week  PT DURATION: 10 weeks  PLANNED INTERVENTIONS: 97164- PT Re-evaluation, 97110-Therapeutic exercises, 97530- Therapeutic activity, 97112- Neuromuscular re-education, 97535- Self Care, 86578- Manual therapy, [phone]- Gait training, [phone]- Electrical stimulation (manual), 97016- Vasopneumatic device, Patient/Family education, Balance training, Stair training, Taping, Joint mobilization, Scar mobilization, DME instructions, and Cryotherapy  PLAN FOR NEXT SESSION:  Continue quad strengthening and addressing gait deviations  Ranee Peasley, Leman Martinek, Student-PT 09/10/2023, 9:44 AM  This entire session of physical  therapy was performed under the direct supervision of PT signing evaluation /treatment. PT reviewed note and agrees.   Vladimir Faster, PT, DPT 09/10/2023, 1:09 PM

## 2023-09-11 ENCOUNTER — Encounter: Payer: No Typology Code available for payment source | Admitting: Orthopaedic Surgery

## 2023-09-14 ENCOUNTER — Ambulatory Visit (INDEPENDENT_AMBULATORY_CARE_PROVIDER_SITE_OTHER): Payer: No Typology Code available for payment source | Admitting: Physical Therapy

## 2023-09-14 DIAGNOSIS — M25562 Pain in left knee: Secondary | ICD-10-CM

## 2023-09-14 DIAGNOSIS — M6281 Muscle weakness (generalized): Secondary | ICD-10-CM

## 2023-09-14 DIAGNOSIS — M25662 Stiffness of left knee, not elsewhere classified: Secondary | ICD-10-CM

## 2023-09-14 DIAGNOSIS — R2689 Other abnormalities of gait and mobility: Secondary | ICD-10-CM

## 2023-09-14 DIAGNOSIS — R600 Localized edema: Secondary | ICD-10-CM

## 2023-09-14 NOTE — Therapy (Signed)
 OUTPATIENT PHYSICAL THERAPY TREATMENT AND PROGRESS NOTE  Patient Name: Justin Nolan MRN: [id number] DOB:13-Oct-1960, 63 y.o., male Today's Date: 09/14/2023  END OF SESSION:  PT End of Session - 09/14/23 0856     Visit Number 10    Number of Visits 20    Date for PT Re-Evaluation 10/02/23    [facility] Type [facility]    Authorization Time Period 15 visits    Authorization - Number of Visits 15    Progress Note Due on Visit 10                Past Medical History:  Diagnosis Date   Anxiety    Arthritis    BPH (benign prostatic hyperplasia)    GERD (gastroesophageal reflux disease)    Hypertension    Major depressive disorder    Pneumonia 02/09/2012   PTSD (post-traumatic stress disorder)    Sleep apnea    does not wear cpap   Past Surgical History:  Procedure Laterality Date   KNEE SURGERY     ROTATOR CUFF REPAIR Left 2020   TOTAL KNEE ARTHROPLASTY Right 05/07/2017   Procedure: RIGHT TOTAL KNEE ARTHROPLASTY;  Surgeon: Tarry Kos, MD;  Location: [facility] OR;  Service: Orthopedics;  Laterality: Right;   TOTAL KNEE ARTHROPLASTY Left 06/29/2023   Procedure: LEFT TOTAL KNEE REPLACEMENT;  Surgeon: Tarry Kos, MD;  Location: [facility] OR;  Service: Orthopedics;  Laterality: Left;   Patient Active Problem List   Diagnosis Date Noted   Primary osteoarthritis of left knee 06/29/2023   Status post total left knee replacement 06/29/2023   Chronic low back pain 02/11/2018   Alcohol abuse 02/16/2012   HTN (hypertension) 02/09/2012   GERD (gastroesophageal reflux disease) 02/09/2012   Tobacco use 02/09/2012   Indirect hyperbilirubinemia 02/09/2012    [facility]  REFERRING PROVIDER: Ivin Poot  REFERRING DIAG: [phone] (ICD-10-CM) - Status post total left knee replacement  THERAPY DIAG:  Acute pain of left knee  Stiffness of left knee, not elsewhere classified  Localized edema  Other abnormalities of gait and mobility  Muscle weakness  (generalized)  Rationale for Evaluation and Treatment: Rehabilitation  ONSET DATE: 06/29/23 Left TKA  SUBJECTIVE:   SUBJECTIVE STATEMENT: [facility] reported [facility] and was unable to attend today's session.  PERTINENT HISTORY: Left TKA (06/29/23), Shoulder arthroscopy w/ RTC repair (03/16/20), Right TKA, L knee OA, Anxiety, arthritis, GERD, HTN, PTSD, Major depressive disorder  PAIN:  NPRS scale: 3-4/10 in the 2 spots around his knee   Pain location: medial side of L knee, no calf pain, mild inguinal  Pain description: constant dull pain, sharp, muscle spasms Aggravating factors: use of knee, lack of meds, initial standing /walking Relieving factors: ice and rest  PRECAUTIONS: Fall  WEIGHT BEARING RESTRICTIONS:  WBAT  FALLS:  Has patient fallen in last 6 months? No  LIVING ENVIRONMENT: Lives with: lives alone Lives in: House/apartment Stairs: Yes: External: 1 steps; none, back steps: 3 steps without rail Has following equipment at home: Single point cane and Walker - 2 wheeled  OCCUPATION: retired, on disability  LEISURE ACTIVITIES: staying healthy   PLOF: Independent  PATIENT GOALS:  To be pain free and be more independent   Next MD visit: around 08/11/23 Dr. Roda Shutters  OBJECTIVE:  DIAGNOSTIC FINDINGS: 06/29/23 XRAY Left knee arthroplasty in expected alignment. No periprosthetic lucency or fracture. Recent postsurgical change includes air and edema in the soft tissues and joint  space.  PATIENT SURVEYS:  09/10/2023: FOTO: 55%   FOTO intake:  31%  predicted:  47%  COGNITION: Overall cognitive status: WFL    SENSATION: WFL  EDEMA:  LLE: above knee 43.5 cm,  around knee 43.25 cm, below knee  39.5 cm gastroc apex 36.5 cm RLE: above knee 40 cm,  around knee 35.5 cm, below knee  33 cm gastroc apex 34 cm  POSTURE:  Standing: flexed trunk with slightly rounded shoulders, weight shift to right Seated: rounded shoulders, keeps L knee  outstretched due to pain with knee flexion requiring 90* or greater  PALPATION: Medial L knee tenderness, no quad tenderness Denies lateral knee pain or calf pain Knee swelling with calor and rubor of incision site Verbalizes difference in sensation of lateral area of knee   LOWER EXTREMITY ROM:   ROM P: passive  A:active Left eval Left 07/29/23 Left  08/03/23 Left  08/12/23 Left 08/19/23 Left 09/07/2023 Left 09/10/2023  Hip flexion         Hip extension         Hip abduction         Hip adduction         Hip internal rotation         Hip external rotation         Knee flexion Seated A: 89* P: 94* Painful end feel Supine A:  91* Pt declined passive Supine A:105 P:111 Standing //bar and 8in step: P:109*  On leg press: P:115* Supine: A:113 P: 119 Supine: A: 116 P:121 Supine:  A:120 P: 123 Supine: A:121  Knee extension Standing A: -16* P: -5* painful  end feel  Seated LAQ -38* Supine  Quad set -10* P: -4* Supine: A:-6  Supine with heel prop: A: -7 P: -1 Seated LAQ: 30deg of flexion Standing  TKE -7* Supine: A: 0 Supine:  A:0  Ankle dorsiflexion         Ankle plantarflexion         Ankle inversion         Ankle eversion          (Blank rows = not tested)  LOWER EXTREMITY MMT:  MMT Left eval Right 09/10/2023 dynamometer Left  09/10/2023 dyamometer  Hip flexion     Hip extension     Hip abduction     Hip adduction     Hip internal rotation     Hip external rotation     Knee flexion 3-/5    Knee extension 3-/5 70.4#, 70.1# 35.4#, 38# LLE:RLE 52%  Ankle dorsiflexion     Ankle plantarflexion     Ankle inversion     Ankle eversion      (Blank rows = not tested)  FUNCTIONAL TESTS:  Eval:  18 inch chair transfer: use of BUE on armrest and use of walker; keeps L leg in front to prevent increased knee flexion when standing and sitting  GAIT: Eval: Distance walked: 39' x2  Assistive device utilized: Environmental consultant - 2 wheeled Level of assistance:  supervision / needs cues for deviations Comments: antalgic with decreased single leg stance time, bilateral internal rotation (toe-in) gait with L>R,  L knee flexion in stance with minimal to no increase for swing, appropriate initial contact with heel strike bilaterally, decreased step length,   TODAY'S TREATMENT  Date: 09/10/2023 Therapeutic Exercise: -Precor recumbent bike seat 8, full revolutions, 8 min, L4 -Leg press BLEs 112# 2x10; LLE only 56# 2x10 sets; vc for proper  -LAQ machine LLE only 5lb 2x10 -gasatroc stretch off step 3x30sec  Theract: -Stair negotiation with 1 handrail x2 flights SBA throughout: descending: with 4 steps LLE step to then 4 steps RLE leading then 3 steps alternating.  Ascending 4 step to with LLE leading then patient continuing on with reciprocal pattern. Descending: step to with alternating lead leg. Patient does best with step to pattern with alternating lead leg demonstrating increased confidence and weight acceptance to LLE   TREATMENT                                                                           Date: 09/07/2023 Therapeutic Exercise: -Precor recumbent bike seat 8, full revolutions, 8 min, L4 -supine leg raise with quad set 2x10; vc and tc for quad set before lift  Theract: -Stair negotiation descended 7 steps with BUE in reciprocal pattern with heavy UE use, then 4 rounds of 4 steps in step to pattern with LLE as the working leg with 1 rail, then ascended stairs with R rail in reciprocal pattern. Patient educated on modification with negotiation of stairs, use of hand railing in community, and practicing at home safely for increased strengthening of quad. Pt verbalized understanding. -Mini squats 2x10 with chair behind; vc and tc for proper weight distribution to LLE -LAQ with 2.5lb ankle weight 2x10 with vc for eccentric control  Manual: Contract relax flexion/extension  for improved ROM Flexion with over pressure to patient tolerance   TREATMENT                                                                           Date: 08/19/2023 Therapeutic Exercise: -Precor recumbent bike seat 8, full revolutions, 8 min, L4 -Gastroc stretch off 6in step 30 sec hold 3 reps with calf raises BLEs 20 reps bw stretch -Leg Press BLEs 100# 10 reps 2 sets; LLE only 50# 10 reps 2 sets -seated LAQ with strap assist for terminal knee extension beyond current AROM, 15 reps with PT verbal & demo cues on technique.   Patient had to leave early due to having another appointment he needed to travel for.    PATIENT EDUCATION:  Education details: HEP, POC, elevation Person educated: Patient Education method: Programmer, multimedia, Demonstration, Verbal cues, and Handouts Education comprehension: verbalized understanding, returned demonstration, and verbal cues required  HOME EXERCISE PROGRAM: Medbridge Code: [id number]  Exercises - Ankle Alphabet in Elevation  - 2-4 x daily - 7 x weekly - 1 sets - 1 reps - Quad Setting and Stretching  - 2-4 x daily - 7 x weekly - 5-10 sets - 10 reps - prop 5-10 minutes & quad set5 seconds hold - Supine Heel Slide with Strap  - 2-3 x daily - 7 x weekly - 2-3 sets - 10 reps -  5 seconds hold - Supine Straight Leg Raises  - 2-3 x daily - 7 x weekly - 2-3 sets - 10 reps - 5 seconds hold - Seated Knee Flexion Extension AROM   - 2-4 x daily - 7 x weekly - 2-3 sets - 10 reps - 5 seconds hold - Seated straight leg lifts  - 2-3 x daily - 7 x weekly - 2-3 sets - 10 reps - 5 seconds hold - Seated Hamstring Stretch with Strap  - 2-4 x daily - 7 x weekly - 1 sets - 3 reps - 30 seconds hold - Standing Quad Set  - 2-3 x daily - 7 x weekly - 2 sets - 10 reps - 5 seconds hold -Ice massage  -Scar mobilization   ASSESSMENT: CLINICAL IMPRESSION: Patient not seen today for session 09/14/2023   OBJECTIVE IMPAIRMENTS: Abnormal gait, decreased activity tolerance, decreased  balance, decreased endurance, decreased knowledge of condition, decreased knowledge of use of DME, decreased mobility, difficulty walking, decreased ROM, decreased strength, increased edema, impaired sensation, postural dysfunction, and pain.   ACTIVITY LIMITATIONS: carrying, lifting, bending, sitting, standing, squatting, sleeping, stairs, transfers, and locomotion level  PARTICIPATION LIMITATIONS: meal prep, cleaning, laundry, and community activity  PERSONAL FACTORS: Time since onset of injury/illness/exacerbation, 3+ comorbidities: see PMH, and Decreased pain medication use with increased pain and interference of sleep  are also affecting patient's functional outcome.   REHAB POTENTIAL: Excellent  CLINICAL DECISION MAKING: Stable/uncomplicated  EVALUATION COMPLEXITY: Low   GOALS: Goals reviewed with patient? Yes  SHORT TERM GOALS: (target date for Short term goals 08/19/23)   1.  Patient will demonstrate independent use of home exercise program to maintain progress from in clinic treatments. Baseline: See objective data Goal status: MET 09/10/2023  2. PROM left knee -5* ext to 105* flex to facilitate proper gait mechanics  Baseline: See objective data Goal status: MET 08/12/2023  3. Interim FOTO 37% Baseline: See objective data Goal status:  MET 09/10/2023  LONG TERM GOALS: (target dates for all long term goals  10/02/23 )   1. Patient will demonstrate/report pain at worst less than or equal to 2/10 to facilitate minimal limitation in daily activity secondary to pain symptoms. Baseline: See objective data Goal status: On going 08/10/2023   2. Patient will demonstrate independent use of home exercise program to facilitate ability to maintain/progress functional gains from skilled physical therapy services. Baseline: See objective data Goal status: On going 08/10/2023   3. Patient will demonstrate FOTO outcome > or = 47 % to indicate reduced disability due to condition. Baseline:  See objective data Goal status: MET 09/10/2023   4.  Patient will demonstrate left knee LE MMT 5/5 throughout to faciltiate usual transfers, stairs, squatting at [facility] for daily life.  Baseline: See objective data Goal status: On going 08/10/2023   5.  Patient will demonstrate left knee AROM 0* to 110* for higher functional demands for daily activities Baseline: See objective data Goal status: MET 09/07/2023   6.  Patient will ambulate >562ft and negotiate ramps, curbs & stairs single rail independently for safety with community ambulation needs Baseline: See objective data Goal status: On going 08/10/2023   PLAN:  PT FREQUENCY:  2x/week  PT DURATION: 10 weeks  PLANNED INTERVENTIONS: 97164- PT Re-evaluation, 97110-Therapeutic exercises, 97530- Therapeutic activity, 97112- Neuromuscular re-education, 97535- Self Care, 16109- Manual therapy, [phone]- Gait training, [phone]- Electrical stimulation (manual), 97016- Vasopneumatic device, Patient/Family education, Balance training, Stair training, Taping, Joint mobilization, Scar mobilization, DME instructions, and  Cryotherapy  PLAN FOR NEXT SESSION:  Continue quad strengthening and addressing gait deviations  Miia Blanks, Gabriella Woodhead, Student-PT 09/14/2023, 8:57 AM

## 2023-09-15 ENCOUNTER — Encounter: Payer: No Typology Code available for payment source | Admitting: Physician Assistant

## 2023-09-16 ENCOUNTER — Ambulatory Visit (INDEPENDENT_AMBULATORY_CARE_PROVIDER_SITE_OTHER): Payer: No Typology Code available for payment source | Admitting: Physical Therapy

## 2023-09-16 ENCOUNTER — Encounter: Payer: Self-pay | Admitting: Physical Therapy

## 2023-09-16 DIAGNOSIS — R2689 Other abnormalities of gait and mobility: Secondary | ICD-10-CM | POA: Diagnosis not present

## 2023-09-16 DIAGNOSIS — R600 Localized edema: Secondary | ICD-10-CM

## 2023-09-16 DIAGNOSIS — M6281 Muscle weakness (generalized): Secondary | ICD-10-CM

## 2023-09-16 DIAGNOSIS — M25662 Stiffness of left knee, not elsewhere classified: Secondary | ICD-10-CM

## 2023-09-16 DIAGNOSIS — M25562 Pain in left knee: Secondary | ICD-10-CM | POA: Diagnosis not present

## 2023-09-16 NOTE — Therapy (Addendum)
 OUTPATIENT PHYSICAL THERAPY TREATMENT  Patient Name: Justin Nolan MRN: [id number] DOB:[birth year], 63 y.o., male Today's Date: 09/16/2023  END OF SESSION:  PT End of Session - 09/16/23 0837     Visit Number 11    Number of Visits 20    Date for PT Re-Evaluation 10/02/23    Authorization Type [facility]    Authorization Time Period 15 visits    Authorization - Number of Visits 15    Progress Note Due on Visit 10    PT Start Time 0845    PT Stop Time 0927    PT Time Calculation (min) 42 min    Activity Tolerance Patient tolerated treatment well;Patient limited by pain    Behavior During Therapy [facility] for tasks assessed/performed                 Past Medical History:  Diagnosis Date   Anxiety    Arthritis    BPH (benign prostatic hyperplasia)    GERD (gastroesophageal reflux disease)    Hypertension    Major depressive disorder    Pneumonia 02/09/2012   PTSD (post-traumatic stress disorder)    Sleep apnea    does not wear cpap   Past Surgical History:  Procedure Laterality Date   KNEE SURGERY     ROTATOR CUFF REPAIR Left 2020   TOTAL KNEE ARTHROPLASTY Right 05/07/2017   Procedure: RIGHT TOTAL KNEE ARTHROPLASTY;  Surgeon: Tarry Kos, MD;  Location: [facility] OR;  Service: Orthopedics;  Laterality: Right;   TOTAL KNEE ARTHROPLASTY Left 06/29/2023   Procedure: LEFT TOTAL KNEE REPLACEMENT;  Surgeon: Tarry Kos, MD;  Location: [facility] OR;  Service: Orthopedics;  Laterality: Left;   Patient Active Problem List   Diagnosis Date Noted   Primary osteoarthritis of left knee 06/29/2023   Status post total left knee replacement 06/29/2023   Chronic low back pain 02/11/2018   Alcohol abuse 02/16/2012   HTN (hypertension) 02/09/2012   GERD (gastroesophageal reflux disease) 02/09/2012   Tobacco use 02/09/2012   Indirect hyperbilirubinemia 02/09/2012    [facility]P: [facility]  REFERRING PROVIDER: Tarry Kos, MD  REFERRING DIAG: [phone] (ICD-10-CM) - Status post total left  knee replacement  THERAPY DIAG:  Acute pain of left knee  Stiffness of left knee, not elsewhere classified  Localized edema  Other abnormalities of gait and mobility  Muscle weakness (generalized)  Rationale for Evaluation and Treatment: Rehabilitation  ONSET DATE: 06/29/23 Left TKA  SUBJECTIVE:   SUBJECTIVE STATEMENT: Patient states his knee is feeling good and he is thinking that the next visit may be the last one. He will know for sure once he sees the doctor next week. Is happy with his progress so far and is willing to continue on if the doctor says he should.   PERTINENT HISTORY: Left TKA (06/29/23), Shoulder arthroscopy w/ RTC repair (03/16/20), Right TKA, L knee OA, Anxiety, arthritis, GERD, HTN, PTSD, Major depressive disorder  PAIN:  NPRS scale: 3-4/10 in the 2 spots around his knee   Pain location: medial side of L knee, no calf pain, mild inguinal  Pain description: constant dull pain, sharp, muscle spasms Aggravating factors: use of knee, lack of meds, initial standing /walking Relieving factors: ice and rest  PRECAUTIONS: Fall  WEIGHT BEARING RESTRICTIONS:  WBAT  FALLS:  Has patient fallen in last 6 months? No  LIVING ENVIRONMENT: Lives with: lives alone Lives in: House/apartment Stairs: Yes: External: 1 steps; none, back steps: 3 steps without rail Has following  equipment at home: Single point cane and Walker - 2 wheeled  OCCUPATION: retired, on disability  LEISURE ACTIVITIES: staying healthy   PLOF: Independent  PATIENT GOALS:  To be pain free and be more independent   Next MD visit: around 08/11/23 Dr. Roda Shutters  OBJECTIVE:  DIAGNOSTIC FINDINGS: 06/29/23 XRAY Left knee arthroplasty in expected alignment. No periprosthetic lucency or fracture. Recent postsurgical change includes air and edema in the soft tissues and joint space.  PATIENT SURVEYS:  09/10/2023: FOTO: 55%  FOTO intake:  31%  predicted:  47%  COGNITION: Overall cognitive status:  WFL    SENSATION: WFL  EDEMA:  LLE: above knee 43.5 cm,  around knee 43.25 cm, below knee  39.5 cm gastroc apex 36.5 cm RLE: above knee 40 cm,  around knee 35.5 cm, below knee  33 cm gastroc apex 34 cm  POSTURE:  Standing: flexed trunk with slightly rounded shoulders, weight shift to right Seated: rounded shoulders, keeps L knee outstretched due to pain with knee flexion requiring 90* or greater  PALPATION: Medial L knee tenderness, no quad tenderness Denies lateral knee pain or calf pain Knee swelling with calor and rubor of incision site Verbalizes difference in sensation of lateral area of knee   LOWER EXTREMITY ROM:   ROM P: passive  A:active Left eval Left 07/29/23 Left  08/03/23 Left  08/12/23 Left 08/19/23 Left 09/07/2023 Left 09/10/2023  Hip flexion         Hip extension         Hip abduction         Hip adduction         Hip internal rotation         Hip external rotation         Knee flexion Seated A: 89* P: 94* Painful end feel Supine A:  91* Pt declined passive Supine A:105 P:111 Standing //bar and 8in step: P:109*  On leg press: P:115* Supine: A:113 P: 119 Supine: A: 116 P:121 Supine:  A:120 P: 123 Supine: A:121  Knee extension Standing A: -16* P: -5* painful  end feel  Seated LAQ -38* Supine  Quad set -10* P: -4* Supine: A:-6  Supine with heel prop: A: -7 P: -1 Seated LAQ: 30deg of flexion Standing  TKE -7* Supine: A: 0 Supine:  A:0  Ankle dorsiflexion         Ankle plantarflexion         Ankle inversion         Ankle eversion          (Blank rows = not tested)  LOWER EXTREMITY MMT:  MMT Left eval Right 09/10/2023 dynamometer Left  09/10/2023 dyamometer  Hip flexion     Hip extension     Hip abduction     Hip adduction     Hip internal rotation     Hip external rotation     Knee flexion 3-/5    Knee extension 3-/5 70.4#, 70.1# 35.4#, 38# LLE:RLE 52%  Ankle dorsiflexion     Ankle plantarflexion     Ankle inversion      Ankle eversion      (Blank rows = not tested)  FUNCTIONAL TESTS:  Eval:  18 inch chair transfer: use of BUE on armrest and use of walker; keeps L leg in front to prevent increased knee flexion when standing and sitting  GAIT: Eval: Distance walked: 50' x2  Assistive device utilized: Environmental consultant - 2 wheeled Level of assistance: supervision / needs  cues for deviations Comments: antalgic with decreased single leg stance time, bilateral internal rotation (toe-in) gait with L>R,  L knee flexion in stance with minimal to no increase for swing, appropriate initial contact with heel strike bilaterally, decreased step length,   TODAY'S TREATMENT                                                                           Date: 09/16/2023 Therapeutic Exercise: -Precor recumbent bike seat 8, full revolutions, 8 min, L5 -gasatroc stretch off step 3x30sec -LAQ machine BLE 20# 2x10 then LLE only 10lb 2x10   Theract: -Leg press BLEs 112# x10 then 118# x10; LLE only 62# 2x10 sets; vc for proper  -TRX split squat 2x5 -stair negotiation with step to pattern alternating lead legs: ascending x2 1st bout able to intermitently use 1 rail, second bout required BUE railing. Descending x2 BUE railing with intermittent 1 rail with other UE hovering. Close SBA for safety -grapevine at sink x4lengths; heavy UE use. vc for allowing knee bend and demo required  TREATMENT                                                                           Date: 09/10/2023 Therapeutic Exercise: -Precor recumbent bike seat 8, full revolutions, 8 min, L4 -Leg press BLEs 112# 2x10; LLE only 56# 2x10 sets; vc for proper  -LAQ machine LLE only 5lb 2x10 -HSC machine LLE only  -gasatroc stretch off step 3x30sec  Theract: -Stair negotiation with 1 handrail x2 flights SBA throughout: descending: with 4 steps LLE step to then 4 steps RLE leading then 3 steps alternating.  Ascending 4 step to with LLE leading then patient continuing on with  reciprocal pattern. Descending: step to with alternating lead leg. Patient does best with step to pattern with alternating lead leg demonstrating increased confidence and weight acceptance to LLE   TREATMENT                                                                           Date: 09/07/2023 Therapeutic Exercise: -Precor recumbent bike seat 8, full revolutions, 8 min, L4 -supine leg raise with quad set 2x10; vc and tc for quad set before lift  Theract: -Stair negotiation descended 7 steps with BUE in reciprocal pattern with heavy UE use, then 4 rounds of 4 steps in step to pattern with LLE as the working leg with 1 rail, then ascended stairs with R rail in reciprocal pattern. Patient educated on modification with negotiation of stairs, use of hand railing in community, and practicing at home safely for increased strengthening of quad. Pt verbalized understanding. -Mini squats 2x10 with chair behind; vc  and tc for proper weight distribution to LLE -LAQ with 2.5lb ankle weight 2x10 with vc for eccentric control  Manual: Contract relax flexion/extension for improved ROM Flexion with over pressure to patient tolerance   TREATMENT                                                                           Date: 08/19/2023 Therapeutic Exercise: -Precor recumbent bike seat 8, full revolutions, 8 min, L4 -Gastroc stretch off 6in step 30 sec hold 3 reps with calf raises BLEs 20 reps bw stretch -Leg Press BLEs 100# 10 reps 2 sets; LLE only 50# 10 reps 2 sets -seated LAQ with strap assist for terminal knee extension beyond current AROM, 15 reps with PT verbal & demo cues on technique.   Patient had to leave early due to having another appointment he needed to travel for.    PATIENT EDUCATION:  Education details: HEP, POC, elevation Person educated: Patient Education method: Programmer, multimedia, Demonstration, Verbal cues, and Handouts Education comprehension: verbalized understanding, returned  demonstration, and verbal cues required  HOME EXERCISE PROGRAM: Medbridge Code: [id number]  Exercises - Ankle Alphabet in Elevation  - 2-4 x daily - 7 x weekly - 1 sets - 1 reps - Quad Setting and Stretching  - 2-4 x daily - 7 x weekly - 5-10 sets - 10 reps - prop 5-10 minutes & quad set5 seconds hold - Supine Heel Slide with Strap  - 2-3 x daily - 7 x weekly - 2-3 sets - 10 reps - 5 seconds hold - Supine Straight Leg Raises  - 2-3 x daily - 7 x weekly - 2-3 sets - 10 reps - 5 seconds hold - Seated Knee Flexion Extension AROM   - 2-4 x daily - 7 x weekly - 2-3 sets - 10 reps - 5 seconds hold - Seated straight leg lifts  - 2-3 x daily - 7 x weekly - 2-3 sets - 10 reps - 5 seconds hold - Seated Hamstring Stretch with Strap  - 2-4 x daily - 7 x weekly - 1 sets - 3 reps - 30 seconds hold - Standing Quad Set  - 2-3 x daily - 7 x weekly - 2 sets - 10 reps - 5 seconds hold -Ice massage  -Scar mobilization   ASSESSMENT: CLINICAL IMPRESSION: Patient continues to make progress, demonstrating increased strength and control as seen during leg press and leg extension machines. Continues to have difficulty with stair negotiation however was able to intermittently use only 1 railing for support. Will benefit from continued skilled physical therapy to address LLE weakness.  OBJECTIVE IMPAIRMENTS: Abnormal gait, decreased activity tolerance, decreased balance, decreased endurance, decreased knowledge of condition, decreased knowledge of use of DME, decreased mobility, difficulty walking, decreased ROM, decreased strength, increased edema, impaired sensation, postural dysfunction, and pain.   ACTIVITY LIMITATIONS: carrying, lifting, bending, sitting, standing, squatting, sleeping, stairs, transfers, and locomotion level  PARTICIPATION LIMITATIONS: meal prep, cleaning, laundry, and community activity  PERSONAL FACTORS: Time since onset of injury/illness/exacerbation, 3+ comorbidities: see PMH, and Decreased  pain medication use with increased pain and interference of sleep  are also affecting patient's functional outcome.   REHAB POTENTIAL: Excellent  CLINICAL DECISION MAKING: Stable/uncomplicated  EVALUATION COMPLEXITY: Low   GOALS: Goals reviewed with patient? Yes  SHORT TERM GOALS: (target date for Short term goals 08/19/23)   1.  Patient will demonstrate independent use of home exercise program to maintain progress from in clinic treatments. Baseline: See objective data Goal status: MET 09/10/2023  2. PROM left knee -5* ext to 105* flex to facilitate proper gait mechanics  Baseline: See objective data Goal status: MET 08/12/2023  3. Interim FOTO 37% Baseline: See objective data Goal status:  MET 09/10/2023  LONG TERM GOALS: (target dates for all long term goals  10/02/23 )   1. Patient will demonstrate/report pain at worst less than or equal to 2/10 to facilitate minimal limitation in daily activity secondary to pain symptoms. Baseline: See objective data Goal status: On going 08/10/2023   2. Patient will demonstrate independent use of home exercise program to facilitate ability to maintain/progress functional gains from skilled physical therapy services. Baseline: See objective data Goal status: On going 08/10/2023   3. Patient will demonstrate FOTO outcome > or = 47 % to indicate reduced disability due to condition. Baseline: See objective data Goal status: MET 09/10/2023   4.  Patient will demonstrate left knee LE MMT 5/5 throughout to faciltiate usual transfers, stairs, squatting at [facility] for daily life.  Baseline: See objective data Goal status: On going 08/10/2023   5.  Patient will demonstrate left knee AROM 0* to 110* for higher functional demands for daily activities Baseline: See objective data Goal status: MET 09/07/2023   6.  Patient will ambulate >547ft and negotiate ramps, curbs & stairs single rail independently for safety with community ambulation needs Baseline:  See objective data Goal status: On going 08/10/2023   PLAN:  PT FREQUENCY:  2x/week  PT DURATION: 10 weeks  PLANNED INTERVENTIONS: 97164- PT Re-evaluation, 97110-Therapeutic exercises, 97530- Therapeutic activity, 97112- Neuromuscular re-education, 97535- Self Care, 16109- Manual therapy, [phone]- Gait training, [phone]- Electrical stimulation (manual), 97016- Vasopneumatic device, Patient/Family education, Balance training, Stair training, Taping, Joint mobilization, Scar mobilization, DME instructions, and Cryotherapy  PLAN FOR NEXT SESSION:  Send progress note to MD,  possible discharge - assess LTGs and discuss,  update HEP to current function, instruct on safety and use of gym exercises  Mazin Emma, Ala Capri, Student-PT 09/16/2023, 9:28 AM  This entire session of physical therapy was performed under the direct supervision of PT signing evaluation /treatment. PT reviewed note and agrees.   Vladimir Faster, PT, DPT 09/16/2023, 11:15 AM

## 2023-09-20 NOTE — Progress Notes (Unsigned)
   Post-Op Visit Note   Patient: Justin Nolan           Date of Birth: [birth year]           MRN: [id number] Visit Date: 09/22/2023 [facility]: [facility]   Assessment & Plan:  Chief Complaint: No chief complaint on file. Visit Diagnoses: No diagnosis found.  Plan: ***  Follow-Up Instructions: No follow-ups on file.   Orders:  No orders of the defined types were placed in this encounter. No orders of the defined types were placed in this encounter.  Imaging: No results found.  PMFS History: Patient Active Problem List   Diagnosis Date Noted  . Primary osteoarthritis of left knee 06/29/2023  . Status post total left knee replacement 06/29/2023  . Chronic low back pain 02/11/2018  . Alcohol abuse 02/16/2012  . HTN (hypertension) 02/09/2012  . GERD (gastroesophageal reflux disease) 02/09/2012  . Tobacco use 02/09/2012  . Indirect hyperbilirubinemia 02/09/2012   Past Medical History:  Diagnosis Date  . Anxiety   . Arthritis   . BPH (benign prostatic hyperplasia)   . GERD (gastroesophageal reflux disease)   . Hypertension   . Major depressive disorder   . Pneumonia 02/09/2012  . PTSD (post-traumatic stress disorder)   . Sleep apnea    does not wear cpap    Family History  Problem Relation Age of Onset  . Diabetes Maternal Aunt   . Diabetes Paternal Aunt     Past Surgical History:  Procedure Laterality Date  . KNEE SURGERY    . ROTATOR CUFF REPAIR Left 2020  . TOTAL KNEE ARTHROPLASTY Right 05/07/2017   Procedure: RIGHT TOTAL KNEE ARTHROPLASTY;  Surgeon: Tarry Kos, MD;  Location: [facility] OR;  Service: Orthopedics;  Laterality: Right;  . TOTAL KNEE ARTHROPLASTY Left 06/29/2023   Procedure: LEFT TOTAL KNEE REPLACEMENT;  Surgeon: Tarry Kos, MD;  Location: [facility] OR;  Service: Orthopedics;  Laterality: Left;   Social History   Occupational History  . Not on file  Tobacco Use  . Smoking status: Every Day    Current packs/day: 0.25    Average packs/day:  0.3 packs/day for 25.0 years (6.3 ttl pk-yrs)    Types: Cigarettes  . Smokeless tobacco: Never  . Tobacco comments:    Taking lozenges and wearing nicotine patches  Vaping Use  . Vaping status: Never Used  Substance and Sexual Activity  . Alcohol use: Yes    Alcohol/week: 3.0 standard drinks of alcohol    Types: 3 Shots of liquor per week    Comment: drink occasional  . Drug use: No  . Sexual activity: Yes

## 2023-09-21 ENCOUNTER — Encounter: Payer: Self-pay | Admitting: Physical Therapy

## 2023-09-21 ENCOUNTER — Ambulatory Visit (INDEPENDENT_AMBULATORY_CARE_PROVIDER_SITE_OTHER): Payer: No Typology Code available for payment source | Admitting: Physical Therapy

## 2023-09-21 DIAGNOSIS — M6281 Muscle weakness (generalized): Secondary | ICD-10-CM

## 2023-09-21 DIAGNOSIS — M25562 Pain in left knee: Secondary | ICD-10-CM | POA: Diagnosis not present

## 2023-09-21 DIAGNOSIS — R600 Localized edema: Secondary | ICD-10-CM

## 2023-09-21 DIAGNOSIS — M25662 Stiffness of left knee, not elsewhere classified: Secondary | ICD-10-CM

## 2023-09-21 DIAGNOSIS — R2689 Other abnormalities of gait and mobility: Secondary | ICD-10-CM | POA: Diagnosis not present

## 2023-09-21 NOTE — Therapy (Addendum)
 PHYSICAL THERAPY DISCHARGE SUMMARY  Visits from Start of Care: 12  Current functional level related to goals / functional outcomes: See below   Remaining deficits: See below   Education / Equipment: Patient was educated in ongoing HEP which he appears to understand.    Patient agrees to discharge. Patient goals were partially met. Patient is being discharged due to the patient's request. Patient satisfied with current functional level and appears to have met rehab goals.   Vladimir Faster, PT, DPT 10/05/2023, 9:58 AM    OUTPATIENT PHYSICAL THERAPY TREATMENT  Patient Name: Justin Nolan MRN: [id number] DOB:[birth year], 63 y.o., male Today's Date: 09/21/2023  END OF SESSION:  PT End of Session - 09/21/23 0852     Visit Number 12    Number of Visits 20    Date for PT Re-Evaluation 10/02/23    Authorization Type [facility]    Authorization Time Period 15 visits    Authorization - Visit Number 12    Authorization - Number of Visits 15    PT Start Time 0845    PT Stop Time 0921    PT Time Calculation (min) 36 min    Activity Tolerance Patient tolerated treatment well    Behavior During Therapy [facility] for tasks assessed/performed                 Past Medical History:  Diagnosis Date   Anxiety    Arthritis    BPH (benign prostatic hyperplasia)    GERD (gastroesophageal reflux disease)    Hypertension    Major depressive disorder    Pneumonia 02/09/2012   PTSD (post-traumatic stress disorder)    Sleep apnea    does not wear cpap   Past Surgical History:  Procedure Laterality Date   KNEE SURGERY     ROTATOR CUFF REPAIR Left 2020   TOTAL KNEE ARTHROPLASTY Right 05/07/2017   Procedure: RIGHT TOTAL KNEE ARTHROPLASTY;  Surgeon: Tarry Kos, MD;  Location: [facility] OR;  Service: Orthopedics;  Laterality: Right;   TOTAL KNEE ARTHROPLASTY Left 06/29/2023   Procedure: LEFT TOTAL KNEE REPLACEMENT;  Surgeon: Tarry Kos, MD;  Location: [facility] OR;  Service: Orthopedics;  Laterality:  Left;   Patient Active Problem List   Diagnosis Date Noted   Primary osteoarthritis of left knee 06/29/2023   Status post total left knee replacement 06/29/2023   Chronic low back pain 02/11/2018   Alcohol abuse 02/16/2012   HTN (hypertension) 02/09/2012   GERD (gastroesophageal reflux disease) 02/09/2012   Tobacco use 02/09/2012   Indirect hyperbilirubinemia 02/09/2012    PCP: [facility]  REFERRING PROVIDER: Tarry Kos, MD  REFERRING DIAG: [phone] (ICD-10-CM) - Status post total left knee replacement  THERAPY DIAG:  Acute pain of left knee  Stiffness of left knee, not elsewhere classified  Localized edema  Other abnormalities of gait and mobility  Muscle weakness (generalized)  Rationale for Evaluation and Treatment: Rehabilitation  ONSET DATE: 06/29/23 Left TKA  SUBJECTIVE:   SUBJECTIVE STATEMENT: Patient states his knee is feeling good and he is seeing the doctor tomorrow. He is hoping that the doctor clears him from formal PT as patient is satisfied with his progress and current level of function. States that he will need to leave the session early due to conflicting schedules.   PERTINENT HISTORY: Left TKA (06/29/23), Shoulder arthroscopy w/ RTC repair (03/16/20), Right TKA, L knee OA, Anxiety, arthritis, GERD, HTN, PTSD, Major depressive disorder  PAIN:  NPRS scale: 0/10  Pain location: medial side of L knee, no calf pain, mild inguinal  Pain description: constant dull pain, sharp, muscle spasms Aggravating factors: use of knee, lack of meds, initial standing /walking Relieving factors: ice and rest  PRECAUTIONS: Fall  WEIGHT BEARING RESTRICTIONS:  WBAT  FALLS:  Has patient fallen in last 6 months? No  LIVING ENVIRONMENT: Lives with: lives alone Lives in: House/apartment Stairs: Yes: External: 1 steps; none, back steps: 3 steps without rail Has following equipment at home: Single point cane and Walker - 2 wheeled  OCCUPATION:  retired, on disability  LEISURE ACTIVITIES: staying healthy   PLOF: Independent  PATIENT GOALS:  To be pain free and be more independent   Next MD visit: around 08/11/23 Dr. Roda Shutters  OBJECTIVE:  DIAGNOSTIC FINDINGS: 06/29/23 XRAY Left knee arthroplasty in expected alignment. No periprosthetic lucency or fracture. Recent postsurgical change includes air and edema in the soft tissues and joint space.  PATIENT SURVEYS:  09/10/2023: FOTO: 55%  FOTO intake:  31%  predicted:  47%  COGNITION: Overall cognitive status: WFL    SENSATION: WFL  EDEMA:  LLE: above knee 43.5 cm,  around knee 43.25 cm, below knee  39.5 cm gastroc apex 36.5 cm RLE: above knee 40 cm,  around knee 35.5 cm, below knee  33 cm gastroc apex 34 cm  POSTURE:  Standing: flexed trunk with slightly rounded shoulders, weight shift to right Seated: rounded shoulders, keeps L knee outstretched due to pain with knee flexion requiring 90* or greater  PALPATION: Medial L knee tenderness, no quad tenderness Denies lateral knee pain or calf pain Knee swelling with calor and rubor of incision site Verbalizes difference in sensation of lateral area of knee   LOWER EXTREMITY ROM:   ROM P: passive  A:active Left eval Left 07/29/23 Left  08/03/23 Left  08/12/23 Left 08/19/23 Left 09/07/2023 Left 09/10/2023 Left 09/21/2023  Hip flexion          Hip extension          Hip abduction          Hip adduction          Hip internal rotation          Hip external rotation          Knee flexion Seated A: 89* P: 94* Painful end feel Supine A:  91* Pt declined passive Supine A:105 P:111 Standing //bar and 8in step: P:109*  On leg press: P:115* Supine: A:113 P: 119 Supine: A: 116 P:121 Supine:  A:120 P: 123 Supine: A:121 Supine A: 120  Knee extension Standing A: -16* P: -5* painful  end feel  Seated LAQ -38* Supine  Quad set -10* P: -4* Supine: A:-6  Supine with heel prop: A: -7 P: -1 Seated LAQ: 30deg of  flexion Standing  TKE -7* Supine: A: 0 Supine:  A:0 Supine: A: 0  Ankle dorsiflexion          Ankle plantarflexion          Ankle inversion          Ankle eversion           (Blank rows = not tested)  LOWER EXTREMITY MMT:  MMT Left eval Right 09/10/2023 dynamometer Left  09/10/2023 dyamometer Right  09/21/2023 dynamometer Left 09/21/2023 dynamometer  Hip flexion       Hip extension       Hip abduction       Hip adduction  Hip internal rotation       Hip external rotation       Knee flexion 3-/5      Knee extension 3-/5 70.4#, 70.1# 35.4#, 38# LLE:RLE 52% 69 #, 73.5# 42#,  46# LLE:RLE 62%  Ankle dorsiflexion       Ankle plantarflexion       Ankle inversion       Ankle eversion        (Blank rows = not tested)  FUNCTIONAL TESTS:  09/21/2023: SLB: L: 54 sec  R:32 sec  Eval:  18 inch chair transfer: use of BUE on armrest and use of walker; keeps L leg in front to prevent increased knee flexion when standing and sitting  GAIT: 09/21/2023: Pt ambulates without device modified independent with slight decreased stance LLE & normal knee motion in swing & stance.    Eval: Distance walked: 8' x2  Assistive device utilized: Environmental consultant - 2 wheeled Level of assistance: supervision / needs cues for deviations Comments: antalgic with decreased single leg stance time, bilateral internal rotation (toe-in) gait with L>R,  L knee flexion in stance with minimal to no increase for swing, appropriate initial contact with heel strike bilaterally, decreased step length,   TODAY'S TREATMENT                                                                           Date: 09/21/2023 Therapeutic Exercise: -Precor recumbent bike seat 8, full revolutions, 8 min, L5 -gasatroc stretch off step 2x30sec -LAQ machine BLE 20# x10 then LLE only 10lb x10  Theract: -TRX split squat 2x5 -descending stair negotiation x2 full flights then 6 stairs; 2 flights with step to pattern alternating lead  legs and single railing use throughout, final 6 stairs reciprocal pattern with 1 railing, Close SBA for safety  Neuro: SLB 2x30 sec hold then timed SLB to patients tolerance- see above for results Side step on foam beam with step on/off and end x4 lengths; no UE  TREATMENT                                                                           Date: 09/16/2023 Therapeutic Exercise: -Precor recumbent bike seat 8, full revolutions, 8 min, L5 -gasatroc stretch off step 3x30sec -LAQ machine BLE 20# 2x10 then LLE only 10lb 2x10   Theract: -Leg press BLEs 112# x10 then 118# x10; LLE only 62# 2x10 sets; vc for proper  -TRX split squat 2x5 -stair negotiation with step to pattern alternating lead legs: ascending x2 1st bout able to intermitently use 1 rail, second bout required BUE railing. Descending x2 BUE railing with intermittent 1 rail with other UE hovering. Close SBA for safety -grapevine at sink x4lengths; heavy UE use. vc for allowing knee bend and demo required  TREATMENT  Date: 09/10/2023 Therapeutic Exercise: -Precor recumbent bike seat 8, full revolutions, 8 min, L4 -Leg press BLEs 112# 2x10; LLE only 56# 2x10 sets; vc for proper  -LAQ machine LLE only 5lb 2x10 -HSC machine LLE only  -gasatroc stretch off step 3x30sec  Theract: -Stair negotiation with 1 handrail x2 flights SBA throughout: descending: with 4 steps LLE step to then 4 steps RLE leading then 3 steps alternating.  Ascending 4 step to with LLE leading then patient continuing on with reciprocal pattern. Descending: step to with alternating lead leg. Patient does best with step to pattern with alternating lead leg demonstrating increased confidence and weight acceptance to LLE   TREATMENT                                                                           Date: 09/07/2023 Therapeutic Exercise: -Precor recumbent bike seat 8, full revolutions, 8  min, L4 -supine leg raise with quad set 2x10; vc and tc for quad set before lift  Theract: -Stair negotiation descended 7 steps with BUE in reciprocal pattern with heavy UE use, then 4 rounds of 4 steps in step to pattern with LLE as the working leg with 1 rail, then ascended stairs with R rail in reciprocal pattern. Patient educated on modification with negotiation of stairs, use of hand railing in community, and practicing at home safely for increased strengthening of quad. Pt verbalized understanding. -Mini squats 2x10 with chair behind; vc and tc for proper weight distribution to LLE -LAQ with 2.5lb ankle weight 2x10 with vc for eccentric control  Manual: Contract relax flexion/extension for improved ROM Flexion with over pressure to patient tolerance   TREATMENT                                                                           Date: 08/19/2023 Therapeutic Exercise: -Precor recumbent bike seat 8, full revolutions, 8 min, L4 -Gastroc stretch off 6in step 30 sec hold 3 reps with calf raises BLEs 20 reps bw stretch -Leg Press BLEs 100# 10 reps 2 sets; LLE only 50# 10 reps 2 sets -seated LAQ with strap assist for terminal knee extension beyond current AROM, 15 reps with PT verbal & demo cues on technique.   Patient had to leave early due to having another appointment he needed to travel for.    PATIENT EDUCATION:  Education details: HEP, POC, elevation Person educated: Patient Education method: Programmer, multimedia, Demonstration, Verbal cues, and Handouts Education comprehension: verbalized understanding, returned demonstration, and verbal cues required  HOME EXERCISE PROGRAM: Medbridge Code: [id number]  Exercises - Ankle Alphabet in Elevation  - 2-4 x daily - 7 x weekly - 1 sets - 1 reps - Quad Setting and Stretching  - 2-4 x daily - 7 x weekly - 5-10 sets - 10 reps - prop 5-10 minutes & quad set5 seconds hold - Supine Heel Slide with Strap  - 2-3 x daily - 7 x weekly -  2-3 sets - 10  reps - 5 seconds hold - Supine Straight Leg Raises  - 2-3 x daily - 7 x weekly - 2-3 sets - 10 reps - 5 seconds hold - Seated Knee Flexion Extension AROM   - 2-4 x daily - 7 x weekly - 2-3 sets - 10 reps - 5 seconds hold - Seated straight leg lifts  - 2-3 x daily - 7 x weekly - 2-3 sets - 10 reps - 5 seconds hold - Seated Hamstring Stretch with Strap  - 2-4 x daily - 7 x weekly - 1 sets - 3 reps - 30 seconds hold - Standing Quad Set  - 2-3 x daily - 7 x weekly - 2 sets - 10 reps - 5 seconds hold -Ice massage  -Scar mobilization   ASSESSMENT: CLINICAL IMPRESSION: Patient has made progress with stair negotiation as he is able to demonstrate increased control of quads as he lowers down with step to pattern, was able to progress to reciprocal pattern. Patient seems to have reached plateau in flexion ROM however it is functional and patient is happy with progress throughout sessions.   OBJECTIVE IMPAIRMENTS: Abnormal gait, decreased activity tolerance, decreased balance, decreased endurance, decreased knowledge of condition, decreased knowledge of use of DME, decreased mobility, difficulty walking, decreased ROM, decreased strength, increased edema, impaired sensation, postural dysfunction, and pain.   ACTIVITY LIMITATIONS: carrying, lifting, bending, sitting, standing, squatting, sleeping, stairs, transfers, and locomotion level  PARTICIPATION LIMITATIONS: meal prep, cleaning, laundry, and community activity  PERSONAL FACTORS: Time since onset of injury/illness/exacerbation, 3+ comorbidities: see PMH, and Decreased pain medication use with increased pain and interference of sleep  are also affecting patient's functional outcome.   REHAB POTENTIAL: Excellent  CLINICAL DECISION MAKING: Stable/uncomplicated  EVALUATION COMPLEXITY: Low   GOALS: Goals reviewed with patient? Yes  SHORT TERM GOALS: (target date for Short term goals 08/19/23)   1.  Patient will demonstrate independent use of home  exercise program to maintain progress from in clinic treatments. Baseline: See objective data Goal status: MET 09/10/2023  2. PROM left knee -5* ext to 105* flex to facilitate proper gait mechanics  Baseline: See objective data Goal status: MET 08/12/2023  3. Interim FOTO 37% Baseline: See objective data Goal status:  MET 09/10/2023  LONG TERM GOALS: (target dates for all long term goals  10/02/23 )   1. Patient will demonstrate/report pain at worst less than or equal to 2/10 to facilitate minimal limitation in daily activity secondary to pain symptoms. Baseline: See objective data Goal status:  MET 09/21/2023   2. Patient will demonstrate independent use of home exercise program to facilitate ability to maintain/progress functional gains from skilled physical therapy services. Baseline: See objective data Goal status:  On going 09/21/2023   3. Patient will demonstrate FOTO outcome > or = 47 % to indicate reduced disability due to condition. Baseline: See objective data Goal status: MET 09/10/2023   4.  Patient will demonstrate left knee LE MMT 5/5 throughout to faciltiate usual transfers, stairs, squatting at [facility] for daily life.  Baseline: See objective data Goal status: On going 09/21/2023   5.  Patient will demonstrate left knee AROM 0* to 110* for higher functional demands for daily activities Baseline: See objective data Goal status: MET 09/07/2023   6.  Patient will ambulate >553ft and negotiate ramps, curbs & stairs single rail independently for safety with community ambulation needs Baseline: See objective data Goal status:  ongoing 09/21/2023   PLAN:  PT FREQUENCY:  2x/week  PT DURATION: 10 weeks  PLANNED INTERVENTIONS: 97164- PT Re-evaluation, 97110-Therapeutic exercises, 97530- Therapeutic activity, 97112- Neuromuscular re-education, 97535- Self Care, 16109- Manual therapy, [phone]- Gait training, [phone]- Electrical stimulation (manual), [phone]- Vasopneumatic device,  Patient/Family education, Balance training, Stair training, Taping, Joint mobilization, Scar mobilization, DME instructions, and Cryotherapy  PLAN FOR NEXT SESSION:  Discharge PT unless MD recommends otherwise. Check next visit of PT and educate on ongoing HEP.   Justin Nolan, Justin Nolan, Student-PT 09/21/2023, 10:11 AM  This entire session of physical therapy was performed under the direct supervision of PT signing evaluation /treatment. PT reviewed note and agrees.   Vladimir Faster, PT, DPT 09/21/2023, 11:00 AM

## 2023-09-22 ENCOUNTER — Other Ambulatory Visit (INDEPENDENT_AMBULATORY_CARE_PROVIDER_SITE_OTHER)

## 2023-09-22 ENCOUNTER — Ambulatory Visit (INDEPENDENT_AMBULATORY_CARE_PROVIDER_SITE_OTHER): Admitting: Orthopaedic Surgery

## 2023-09-22 DIAGNOSIS — Z96652 Presence of left artificial knee joint: Secondary | ICD-10-CM

## 2023-09-28 ENCOUNTER — Encounter: Payer: No Typology Code available for payment source | Admitting: Physical Therapy

## 2023-10-05 ENCOUNTER — Encounter: Payer: No Typology Code available for payment source | Admitting: Physical Therapy

## 2023-12-23 ENCOUNTER — Ambulatory Visit (INDEPENDENT_AMBULATORY_CARE_PROVIDER_SITE_OTHER): Admitting: Orthopaedic Surgery

## 2023-12-23 ENCOUNTER — Other Ambulatory Visit (INDEPENDENT_AMBULATORY_CARE_PROVIDER_SITE_OTHER)

## 2023-12-23 ENCOUNTER — Encounter: Payer: Self-pay | Admitting: Orthopaedic Surgery

## 2023-12-23 DIAGNOSIS — Z96652 Presence of left artificial knee joint: Secondary | ICD-10-CM

## 2023-12-23 NOTE — Progress Notes (Signed)
 Office Visit Note   Patient: Justin Nolan           Date of Birth: April 07, 1961           MRN: [id number] Visit Date: 12/23/2023              Requested by: Eleanora Grew [address] [facility] [place],  [place] [address] PCP: [facility], [facility]   Assessment & Plan: Visit Diagnoses:  1. Status post total left knee replacement     Plan: History of Present Illness Patient is a 63 year old male who presents for a six-month follow-up after left knee replacement surgery.  He experiences occasional pain in the left knee, particularly after prolonged sitting. He is satisfied with the overall function of the knee replacement. His right knee replacement from October 2018 remains successful with no issues.  Physical Exam MUSCULOSKELETAL: Left knee incision healed, good extension and flexion, ligaments solid.  Results RADIOLOGY Left knee x-ray: Total knee arthroplasty appears intact, no complications noted (12/23/2023)  Assessment and Plan Left knee replacement Six months post-arthroplasty with intermittent pain after prolonged sitting. X-rays confirm prosthesis in good condition. - Schedule follow-up in six months with repeat radiographs  Follow-Up Instructions: Return in about 6 months (around 06/23/2024).   Orders:  Orders Placed This Encounter  Procedures   XR Knee 1-2 Views Left   No orders of the defined types were placed in this encounter.   Subjective: Chief Complaint  Patient presents with   Left Knee - Pain    Left total knee arthroplasty 06/29/2023    HPI  Review of Systems  Constitutional: Negative.   HENT: Negative.    Eyes: Negative.   Respiratory: Negative.    Cardiovascular: Negative.   Gastrointestinal: Negative.   Endocrine: Negative.   Genitourinary: Negative.   Skin: Negative.   Allergic/Immunologic: Negative.   Neurological: Negative.   Hematological: Negative.   Psychiatric/Behavioral: Negative.    All other systems  reviewed and are negative.    Objective: Vital Signs: There were no vitals taken for this visit.  Physical Exam Vitals and nursing note reviewed.  Constitutional:      Appearance: He is well-developed.  HENT:     Head: Normocephalic and atraumatic.  Eyes:     Pupils: Pupils are equal, round, and reactive to light.  Pulmonary:     Effort: Pulmonary effort is normal.  Abdominal:     Palpations: Abdomen is soft.  Musculoskeletal:        General: Normal range of motion.     Cervical back: Neck supple.  Skin:    General: Skin is warm.  Neurological:     Mental Status: He is alert and oriented to person, place, and time.  Psychiatric:        Behavior: Behavior normal.        Thought Content: Thought content normal.        Judgment: Judgment normal.     Ortho Exam  Specialty Comments:  No specialty comments available.  Imaging: XR Knee 1-2 Views Left Result Date: 12/23/2023 X-rays of the left knee show a stable left total knee replacement in good alignment.     PMFS History: Patient Active Problem List   Diagnosis Date Noted   Primary osteoarthritis of left knee 06/29/2023   Status post total left knee replacement 06/29/2023   Chronic low back pain 02/11/2018   Alcohol abuse 02/16/2012   HTN (hypertension) 02/09/2012   GERD (gastroesophageal reflux disease) 02/09/2012  Tobacco use 02/09/2012   Indirect hyperbilirubinemia 02/09/2012   Past Medical History:  Diagnosis Date   Anxiety    Arthritis    BPH (benign prostatic hyperplasia)    GERD (gastroesophageal reflux disease)    Hypertension    Major depressive disorder    Pneumonia 02/09/2012   PTSD (post-traumatic stress disorder)    Sleep apnea    does not wear cpap    Family History  Problem Relation Age of Onset   Diabetes Maternal Aunt    Diabetes Paternal Aunt     Past Surgical History:  Procedure Laterality Date   KNEE SURGERY     ROTATOR CUFF REPAIR Left 2020   TOTAL KNEE ARTHROPLASTY Right  05/07/2017   Procedure: RIGHT TOTAL KNEE ARTHROPLASTY;  Surgeon: Wes Hamman, MD;  Location: [facility] OR;  Service: Orthopedics;  Laterality: Right;   TOTAL KNEE ARTHROPLASTY Left 06/29/2023   Procedure: LEFT TOTAL KNEE REPLACEMENT;  Surgeon: Wes Hamman, MD;  Location: [facility] OR;  Service: Orthopedics;  Laterality: Left;   Social History   Occupational History   Not on file  Tobacco Use   Smoking status: Every Day    Current packs/day: 0.25    Average packs/day: 0.3 packs/day for 25.0 years (6.3 ttl pk-yrs)    Types: Cigarettes   Smokeless tobacco: Never   Tobacco comments:    Taking lozenges and wearing nicotine  patches  Vaping Use   Vaping status: Never Used  Substance and Sexual Activity   Alcohol use: Yes    Alcohol/week: 3.0 standard drinks of alcohol    Types: 3 Shots of liquor per week    Comment: drink occasional   Drug use: No   Sexual activity: Yes

## 2024-06-23 ENCOUNTER — Ambulatory Visit: Admitting: Orthopaedic Surgery

## 2024-06-30 ENCOUNTER — Other Ambulatory Visit: Payer: Self-pay

## 2024-06-30 ENCOUNTER — Ambulatory Visit: Admitting: Orthopaedic Surgery

## 2024-06-30 DIAGNOSIS — Z96652 Presence of left artificial knee joint: Secondary | ICD-10-CM

## 2024-06-30 NOTE — Progress Notes (Signed)
 Office Visit Note   Patient: Justin Nolan           Date of Birth: [birth year]           MRN: [id number] Visit Date: 06/30/2024              Requested by: No referring provider defined for this encounter. PCP: Pcp, No   Assessment & Plan: Visit Diagnoses:  1. Status post total left knee replacement     Plan: History of Present Illness Justin Nolan is a 63 year old male who presents for a one-year postoperative follow-up after knee replacement surgery.  He is one year status post knee replacement and has intermittent knee pain every nine days, similar to prior episodes when he had fluid in the knee.  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 He walks with his feet turned out due to being pigeon-toed, which he feels reduces pain.  He would like medication available for pain control.  Physical Exam MUSCULOSKELETAL: Left knee shows fully healed surgical scar.  Excellent functional range of motion.  Collaterals are stable.  Results Radiology Left knee X-ray (06/30/2024): Normal alignment and appearance of left knee prosthesis (Independently interpreted)  Assessment and Plan Status post total left knee replacement One year post-surgery with occasional pain. Knee replacement functioning well. X-rays satisfactory. Pain possibly related to other musculoskeletal issues. - Scheduled follow-up in one year for two-year post-operative evaluation. - Advised consultation with primary care physician for chronic pain management.  Follow-Up Instructions: Return in about 1 year (around 06/30/2025).   Orders:  Orders Placed This Encounter  Procedures   XR Knee 1-2 Views Left   No orders of the defined types were placed in this encounter.     Procedures: No procedures performed   Clinical Data: No additional findings.   Subjective: Chief Complaint  Patient presents with   Left Knee - Pain    Left  total knee arthroplasty 06/29/2023    HPI  Review of Systems  Constitutional: Negative.   HENT: Negative.    Eyes: Negative.   Respiratory: Negative.    Cardiovascular: Negative.   Gastrointestinal: Negative.   Endocrine: Negative.   Genitourinary: Negative.   Skin: Negative.   Allergic/Immunologic: Negative.   Neurological: Negative.   Hematological: Negative.   Psychiatric/Behavioral: Negative.    All other systems reviewed and are negative.    Objective: Vital Signs: There were no vitals taken for this visit.  Physical Exam Vitals and nursing note reviewed.  Constitutional:      Appearance: He is well-developed.  HENT:     Head: Normocephalic and atraumatic.  Eyes:     Pupils: Pupils are equal, round, and reactive to light.  Pulmonary:     Effort: Pulmonary effort is normal.  Abdominal:     Palpations: Abdomen is soft.  Musculoskeletal:        General: Normal range of motion.     Cervical back: Neck supple.  Skin:    General: Skin is warm.  Neurological:     Mental Status: He is alert and oriented to person, place, and time.  Psychiatric:        Behavior: Behavior normal.        Thought Content: Thought content normal.        Judgment: Judgment normal.     Ortho Exam  Specialty Comments:  No specialty comments available.  Imaging: No results found.   PMFS History: Patient Active Problem List   Diagnosis Date Noted   Primary osteoarthritis of left knee 06/29/2023   Status post total left knee replacement 06/29/2023   Chronic low back pain 02/11/2018   Alcohol abuse 02/16/2012   HTN (hypertension) 02/09/2012   GERD (gastroesophageal reflux disease) 02/09/2012   Tobacco use 02/09/2012   Indirect hyperbilirubinemia 02/09/2012   Past Medical History:  Diagnosis Date   Anxiety    Arthritis    BPH (benign prostatic hyperplasia)    GERD (gastroesophageal reflux disease)    Hypertension    Major depressive disorder    Pneumonia 02/09/2012    PTSD (post-traumatic stress disorder)    Sleep apnea    does not wear cpap    Family History  Problem Relation Age of Onset   Diabetes Maternal Aunt    Diabetes Paternal Aunt     Past Surgical History:  Procedure Laterality Date   KNEE SURGERY     ROTATOR CUFF REPAIR Left 2020   TOTAL KNEE ARTHROPLASTY Right 05/07/2017   Procedure: RIGHT TOTAL KNEE ARTHROPLASTY;  Surgeon: Justin Kay HERO, MD;  Location: [facility] OR;  Service: Orthopedics;  Laterality: Right;   TOTAL KNEE ARTHROPLASTY Left 06/29/2023   Procedure: LEFT TOTAL KNEE REPLACEMENT;  Surgeon: Justin Kay HERO, MD;  Location: [facility] OR;  Service: Orthopedics;  Laterality: Left;   Social History   Occupational History   Not on file  Tobacco Use   Smoking status: Every Day    Current packs/day: 0.25    Average packs/day: 0.3 packs/day for 25.0 years (6.3 ttl pk-yrs)    Types: Cigarettes   Smokeless tobacco: Never   Tobacco comments:    Taking lozenges and wearing nicotine  patches  Vaping Use   Vaping status: Never Used  Substance and Sexual Activity   Alcohol use: Yes    Alcohol/week: 3.0 standard drinks of alcohol    Types: 3 Shots of liquor per week    Comment: drink occasional   Drug use: No   Sexual activity: Yes
